# Patient Record
Sex: Male | Born: 1955 | Race: Black or African American | Hispanic: No | Smoking: Current every day smoker
Health system: Southern US, Community
[De-identification: ages and names within clinical notes are randomized; demographics above are authoritative.]

## PROBLEM LIST (undated history)

## (undated) DIAGNOSIS — I4901 Ventricular fibrillation: Secondary | ICD-10-CM

## (undated) DIAGNOSIS — Z720 Tobacco use: Secondary | ICD-10-CM

## (undated) DIAGNOSIS — I513 Intracardiac thrombosis, not elsewhere classified: Secondary | ICD-10-CM

## (undated) DIAGNOSIS — I472 Ventricular tachycardia: Secondary | ICD-10-CM

## (undated) DIAGNOSIS — E785 Hyperlipidemia, unspecified: Secondary | ICD-10-CM

## (undated) DIAGNOSIS — R569 Unspecified convulsions: Secondary | ICD-10-CM

## (undated) DIAGNOSIS — I2510 Atherosclerotic heart disease of native coronary artery without angina pectoris: Secondary | ICD-10-CM

## (undated) DIAGNOSIS — F1020 Alcohol dependence, uncomplicated: Secondary | ICD-10-CM

## (undated) DIAGNOSIS — I8290 Acute embolism and thrombosis of unspecified vein: Secondary | ICD-10-CM

## (undated) DIAGNOSIS — I255 Ischemic cardiomyopathy: Secondary | ICD-10-CM

## (undated) DIAGNOSIS — K219 Gastro-esophageal reflux disease without esophagitis: Secondary | ICD-10-CM

## (undated) DIAGNOSIS — I10 Essential (primary) hypertension: Secondary | ICD-10-CM

## (undated) DIAGNOSIS — I4729 Other ventricular tachycardia: Secondary | ICD-10-CM

## (undated) DIAGNOSIS — S301XXA Contusion of abdominal wall, initial encounter: Secondary | ICD-10-CM

## (undated) HISTORY — PX: TONSILLECTOMY: SUR1361

## (undated) HISTORY — DX: Hyperlipidemia, unspecified: E78.5

## (undated) HISTORY — PX: CARDIAC CATHETERIZATION: SHX172

## (undated) HISTORY — PX: OTHER SURGICAL HISTORY: SHX169

## (undated) HISTORY — PX: CORONARY ANGIOPLASTY: SHX604

---

## 2005-02-07 ENCOUNTER — Emergency Department: Payer: Self-pay | Admitting: Emergency Medicine

## 2005-02-15 ENCOUNTER — Emergency Department: Payer: Self-pay | Admitting: Emergency Medicine

## 2007-11-22 ENCOUNTER — Emergency Department: Payer: Self-pay | Admitting: Internal Medicine

## 2007-11-22 ENCOUNTER — Other Ambulatory Visit: Payer: Self-pay

## 2007-11-26 ENCOUNTER — Ambulatory Visit: Payer: Self-pay | Admitting: Internal Medicine

## 2012-08-07 ENCOUNTER — Inpatient Hospital Stay (HOSPITAL_COMMUNITY)
Admission: EM | Admit: 2012-08-07 | Discharge: 2012-08-22 | DRG: 246 | Disposition: A | Discharge status: Home / Self Care | Payer: Medicaid Other | Attending: Cardiology | Admitting: Cardiology

## 2012-08-07 ENCOUNTER — Encounter (HOSPITAL_COMMUNITY): Payer: Self-pay | Admitting: Physician Assistant

## 2012-08-07 ENCOUNTER — Inpatient Hospital Stay (HOSPITAL_COMMUNITY): Payer: Medicaid Other

## 2012-08-07 ENCOUNTER — Encounter (HOSPITAL_COMMUNITY)
Admission: EM | Disposition: A | Discharge status: Home / Self Care | Payer: Self-pay | Source: Home / Self Care | Attending: Cardiology

## 2012-08-07 DIAGNOSIS — Z720 Tobacco use: Secondary | ICD-10-CM | POA: Diagnosis present

## 2012-08-07 DIAGNOSIS — I959 Hypotension, unspecified: Secondary | ICD-10-CM | POA: Diagnosis not present

## 2012-08-07 DIAGNOSIS — Y840 Cardiac catheterization as the cause of abnormal reaction of the patient, or of later complication, without mention of misadventure at the time of the procedure: Secondary | ICD-10-CM | POA: Diagnosis not present

## 2012-08-07 DIAGNOSIS — E7800 Pure hypercholesterolemia, unspecified: Secondary | ICD-10-CM | POA: Diagnosis present

## 2012-08-07 DIAGNOSIS — F1020 Alcohol dependence, uncomplicated: Secondary | ICD-10-CM | POA: Diagnosis present

## 2012-08-07 DIAGNOSIS — I2510 Atherosclerotic heart disease of native coronary artery without angina pectoris: Secondary | ICD-10-CM | POA: Diagnosis present

## 2012-08-07 DIAGNOSIS — I8290 Acute embolism and thrombosis of unspecified vein: Secondary | ICD-10-CM

## 2012-08-07 DIAGNOSIS — I10 Essential (primary) hypertension: Secondary | ICD-10-CM | POA: Diagnosis present

## 2012-08-07 DIAGNOSIS — I255 Ischemic cardiomyopathy: Secondary | ICD-10-CM

## 2012-08-07 DIAGNOSIS — F17200 Nicotine dependence, unspecified, uncomplicated: Secondary | ICD-10-CM

## 2012-08-07 DIAGNOSIS — S301XXD Contusion of abdominal wall, subsequent encounter: Secondary | ICD-10-CM

## 2012-08-07 DIAGNOSIS — I2109 ST elevation (STEMI) myocardial infarction involving other coronary artery of anterior wall: Secondary | ICD-10-CM

## 2012-08-07 DIAGNOSIS — I2129 ST elevation (STEMI) myocardial infarction involving other sites: Secondary | ICD-10-CM

## 2012-08-07 DIAGNOSIS — I472 Ventricular tachycardia, unspecified: Principal | ICD-10-CM | POA: Diagnosis present

## 2012-08-07 DIAGNOSIS — IMO0002 Reserved for concepts with insufficient information to code with codable children: Secondary | ICD-10-CM | POA: Diagnosis not present

## 2012-08-07 DIAGNOSIS — I252 Old myocardial infarction: Secondary | ICD-10-CM

## 2012-08-07 DIAGNOSIS — S301XXA Contusion of abdominal wall, initial encounter: Secondary | ICD-10-CM

## 2012-08-07 DIAGNOSIS — R209 Unspecified disturbances of skin sensation: Secondary | ICD-10-CM | POA: Diagnosis not present

## 2012-08-07 DIAGNOSIS — I2589 Other forms of chronic ischemic heart disease: Secondary | ICD-10-CM | POA: Diagnosis present

## 2012-08-07 DIAGNOSIS — Z87891 Personal history of nicotine dependence: Secondary | ICD-10-CM

## 2012-08-07 DIAGNOSIS — Y831 Surgical operation with implant of artificial internal device as the cause of abnormal reaction of the patient, or of later complication, without mention of misadventure at the time of the procedure: Secondary | ICD-10-CM | POA: Diagnosis not present

## 2012-08-07 DIAGNOSIS — K219 Gastro-esophageal reflux disease without esophagitis: Secondary | ICD-10-CM | POA: Diagnosis present

## 2012-08-07 DIAGNOSIS — I4901 Ventricular fibrillation: Secondary | ICD-10-CM | POA: Diagnosis not present

## 2012-08-07 DIAGNOSIS — I743 Embolism and thrombosis of arteries of the lower extremities: Secondary | ICD-10-CM | POA: Diagnosis not present

## 2012-08-07 DIAGNOSIS — T82897A Other specified complication of cardiac prosthetic devices, implants and grafts, initial encounter: Secondary | ICD-10-CM | POA: Diagnosis not present

## 2012-08-07 DIAGNOSIS — I491 Atrial premature depolarization: Secondary | ICD-10-CM | POA: Diagnosis present

## 2012-08-07 DIAGNOSIS — I5189 Other ill-defined heart diseases: Secondary | ICD-10-CM | POA: Diagnosis present

## 2012-08-07 DIAGNOSIS — I519 Heart disease, unspecified: Secondary | ICD-10-CM | POA: Diagnosis not present

## 2012-08-07 DIAGNOSIS — I4729 Other ventricular tachycardia: Principal | ICD-10-CM | POA: Diagnosis present

## 2012-08-07 HISTORY — DX: Unspecified convulsions: R56.9

## 2012-08-07 HISTORY — DX: Ventricular tachycardia: I47.2

## 2012-08-07 HISTORY — DX: Contusion of abdominal wall, initial encounter: S30.1XXA

## 2012-08-07 HISTORY — DX: Intracardiac thrombosis, not elsewhere classified: I51.3

## 2012-08-07 HISTORY — DX: Essential (primary) hypertension: I10

## 2012-08-07 HISTORY — DX: Alcohol dependence, uncomplicated: F10.20

## 2012-08-07 HISTORY — DX: Ventricular fibrillation: I49.01

## 2012-08-07 HISTORY — PX: PERCUTANEOUS CORONARY STENT INTERVENTION (PCI-S): SHX5485

## 2012-08-07 HISTORY — DX: Acute embolism and thrombosis of unspecified vein: I82.90

## 2012-08-07 HISTORY — DX: Other ventricular tachycardia: I47.29

## 2012-08-07 HISTORY — DX: Tobacco use: Z72.0

## 2012-08-07 HISTORY — PX: LEFT HEART CATHETERIZATION WITH CORONARY ANGIOGRAM: SHX5451

## 2012-08-07 HISTORY — DX: Gastro-esophageal reflux disease without esophagitis: K21.9

## 2012-08-07 HISTORY — DX: Atherosclerotic heart disease of native coronary artery without angina pectoris: I25.10

## 2012-08-07 HISTORY — DX: Ischemic cardiomyopathy: I25.5

## 2012-08-07 LAB — COMPREHENSIVE METABOLIC PANEL
ALT: 21 U/L (ref 0–53)
Albumin: 4.3 g/dL (ref 3.5–5.2)
Alkaline Phosphatase: 54 U/L (ref 39–117)
BUN: 10 mg/dL (ref 6–23)
Chloride: 99 mEq/L (ref 96–112)
Potassium: 3.9 mEq/L (ref 3.5–5.1)
Sodium: 138 mEq/L (ref 135–145)
Total Bilirubin: 1 mg/dL (ref 0.3–1.2)
Total Protein: 7.6 g/dL (ref 6.0–8.3)

## 2012-08-07 LAB — CBC
MCHC: 35 g/dL (ref 30.0–36.0)
MCV: 86.4 fL (ref 78.0–100.0)
Platelets: 185 10*3/uL (ref 150–400)
RDW: 12.2 % (ref 11.5–15.5)
WBC: 8.5 10*3/uL (ref 4.0–10.5)

## 2012-08-07 LAB — LIPID PANEL
HDL: 78 mg/dL (ref >39)
LDL Cholesterol: 113 mg/dL — ABNORMAL HIGH (ref 0–99)
Total CHOL/HDL Ratio: 2.6 RATIO
Triglycerides: 76 mg/dL (ref <150)
VLDL: 15 mg/dL (ref 0–40)

## 2012-08-07 LAB — PROTIME-INR: Prothrombin Time: 62.9 seconds — ABNORMAL HIGH (ref 11.6–15.2)

## 2012-08-07 LAB — TROPONIN I: Troponin I: 1.42 ng/mL (ref <0.30)

## 2012-08-07 LAB — PRO B NATRIURETIC PEPTIDE: Pro B Natriuretic peptide (BNP): 203.8 pg/mL — ABNORMAL HIGH (ref 0–125)

## 2012-08-07 LAB — MRSA PCR SCREENING: MRSA by PCR: NEGATIVE

## 2012-08-07 SURGERY — LEFT HEART CATHETERIZATION WITH CORONARY ANGIOGRAM

## 2012-08-07 MED ORDER — ENOXAPARIN SODIUM 40 MG/0.4ML ~~LOC~~ SOLN
40.0000 mg | SUBCUTANEOUS | Status: DC
  Filled 2012-08-07: qty 0.4

## 2012-08-07 MED ORDER — BIVALIRUDIN 250 MG IV SOLR
INTRAVENOUS | Status: AC
  Filled 2012-08-07: qty 250

## 2012-08-07 MED ORDER — CARVEDILOL 3.125 MG PO TABS
3.1250 mg | ORAL_TABLET | Freq: Two times a day (BID) | ORAL | Status: DC
  Administered 2012-08-07: 3.125 mg via ORAL
  Filled 2012-08-07 (×4): qty 1

## 2012-08-07 MED ORDER — ACETAMINOPHEN 325 MG PO TABS: 650.0000 mg | ORAL_TABLET | ORAL | Status: DC | PRN

## 2012-08-07 MED ORDER — ALPRAZOLAM 0.25 MG PO TABS: 0.2500 mg | ORAL_TABLET | Freq: Two times a day (BID) | ORAL | Status: DC | PRN

## 2012-08-07 MED ORDER — SODIUM CHLORIDE 0.9 % IV SOLN
250.0000 mL | INTRAVENOUS | Status: DC | PRN
  Administered 2012-08-13 – 2012-08-14 (×2): 250 mL via INTRAVENOUS

## 2012-08-07 MED ORDER — METOPROLOL TARTRATE 1 MG/ML IV SOLN
2.5000 mg | INTRAVENOUS | Status: AC
  Administered 2012-08-08: 2.5 mg via INTRAVENOUS
  Filled 2012-08-07: qty 5

## 2012-08-07 MED ORDER — NITROGLYCERIN 0.4 MG SL SUBL
0.4000 mg | SUBLINGUAL_TABLET | SUBLINGUAL | Status: DC | PRN
  Administered 2012-08-15: 0.4 mg via SUBLINGUAL

## 2012-08-07 MED ORDER — SODIUM CHLORIDE 0.9 % IJ SOLN: 3.0000 mL | INTRAMUSCULAR | Status: DC | PRN

## 2012-08-07 MED ORDER — ASPIRIN EC 81 MG PO TBEC
81.0000 mg | DELAYED_RELEASE_TABLET | Freq: Every day | ORAL | Status: DC
  Administered 2012-08-08 – 2012-08-09 (×2): 81 mg via ORAL
  Filled 2012-08-07 (×2): qty 1

## 2012-08-07 MED ORDER — EPTIFIBATIDE 75 MG/100ML IV SOLN
2.0000 ug/kg/min | INTRAVENOUS | Status: AC
  Administered 2012-08-08: 2 ug/kg/min via INTRAVENOUS
  Filled 2012-08-07 (×3): qty 100

## 2012-08-07 MED ORDER — VERAPAMIL HCL 2.5 MG/ML IV SOLN
INTRAVENOUS | Status: AC
  Filled 2012-08-07: qty 2

## 2012-08-07 MED ORDER — ASPIRIN 81 MG PO CHEW: 81.0000 mg | CHEWABLE_TABLET | Freq: Every day | ORAL | Status: DC

## 2012-08-07 MED ORDER — FENTANYL CITRATE 0.05 MG/ML IJ SOLN
INTRAMUSCULAR | Status: AC
  Filled 2012-08-07: qty 2

## 2012-08-07 MED ORDER — ZOLPIDEM TARTRATE 5 MG PO TABS: 5.0000 mg | ORAL_TABLET | Freq: Every evening | ORAL | Status: DC | PRN

## 2012-08-07 MED ORDER — SODIUM CHLORIDE 0.9 % IV SOLN
0.2500 mg/kg/h | INTRAVENOUS | Status: AC
  Filled 2012-08-07: qty 250

## 2012-08-07 MED ORDER — ATORVASTATIN CALCIUM 80 MG PO TABS
80.0000 mg | ORAL_TABLET | Freq: Every day | ORAL | Status: DC
  Administered 2012-08-08 – 2012-08-21 (×14): 80 mg via ORAL
  Filled 2012-08-07 (×16): qty 1

## 2012-08-07 MED ORDER — SODIUM CHLORIDE 0.9 % IV SOLN: 1.0000 mL/kg/h | INTRAVENOUS | Status: AC

## 2012-08-07 MED ORDER — LISINOPRIL 2.5 MG PO TABS
2.5000 mg | ORAL_TABLET | Freq: Every day | ORAL | Status: DC
  Administered 2012-08-08 – 2012-08-09 (×2): 2.5 mg via ORAL
  Filled 2012-08-07 (×3): qty 1

## 2012-08-07 MED ORDER — SODIUM CHLORIDE 0.9 % IJ SOLN
3.0000 mL | Freq: Two times a day (BID) | INTRAMUSCULAR | Status: DC
  Administered 2012-08-08 – 2012-08-19 (×16): 3 mL via INTRAVENOUS

## 2012-08-07 MED ORDER — EPTIFIBATIDE 75 MG/100ML IV SOLN
INTRAVENOUS | Status: AC
  Administered 2012-08-08: 1.993 ug/kg/min via INTRAVENOUS
  Filled 2012-08-07: qty 100

## 2012-08-07 MED ORDER — LIDOCAINE HCL (PF) 1 % IJ SOLN
INTRAMUSCULAR | Status: AC
  Filled 2012-08-07: qty 30

## 2012-08-07 MED ORDER — HEPARIN (PORCINE) IN NACL 2-0.9 UNIT/ML-% IJ SOLN
INTRAMUSCULAR | Status: AC
  Filled 2012-08-07: qty 1000

## 2012-08-07 MED ORDER — TICAGRELOR 90 MG PO TABS
90.0000 mg | ORAL_TABLET | Freq: Two times a day (BID) | ORAL | Status: DC
  Administered 2012-08-07 – 2012-08-11 (×9): 90 mg via ORAL
  Filled 2012-08-07 (×11): qty 1

## 2012-08-07 MED ORDER — TICAGRELOR 90 MG PO TABS
ORAL_TABLET | ORAL | Status: AC
  Filled 2012-08-07: qty 2

## 2012-08-07 MED ORDER — HEPARIN SODIUM (PORCINE) 1000 UNIT/ML IJ SOLN
INTRAMUSCULAR | Status: AC
  Filled 2012-08-07: qty 1

## 2012-08-07 MED ORDER — ONDANSETRON HCL 4 MG/2ML IJ SOLN: 4.0000 mg | Freq: Four times a day (QID) | INTRAMUSCULAR | Status: DC | PRN

## 2012-08-07 NOTE — CV Procedure (Signed)
Cardiac Catheterization Procedure Note  Name: Edward Cooper MRN: [id number] DOB: 08-20-1955  Procedure: Left Heart Cath, Selective Coronary Angiography, LV angiography,  PTCA/Stent of the ostial LAD, thrombus extraction.  Indication: 57 yo BM with history of tobacco abuse presents with an anterior STEMI with marked ST elevation (5-6 mm). He also developed sustained ventricular tachycardia that converted spontaneously.   Diagnostic Procedure Details: We initially prepped and draped the right wrist. Despite a good radial pulse we were unable to access the artery and switched to a femoral access.The right groin was prepped, draped, and anesthetized with 1% lidocaine. Using the modified Seldinger technique, a 6 French sheath was introduced into the right femoral artery. Standard Judkins catheters were used for selective coronary angiography and left ventriculography. Catheter exchanges were performed over a wire.  The diagnostic procedure was well-tolerated without immediate complications.  PROCEDURAL FINDINGS Hemodynamics: AO 130/73 mm Hg LV 132/10 mm Hg  Coronary angiography: Coronary dominance: right  Left mainstem: The left main is very short and bifurcates immediately into the LAD and LCx.  Left anterior descending (LAD): There is a ruptured plaque at the ostium of the LAD. There is a 95% ostial stenosis with haziness and thrombus. The mid LAD is occluded with TIMI 0 flow. There is a moderate sized first diagonal that appears normal.  Left circumflex (LCx): Normal.  Right coronary artery (RCA): dominant vessel with diffuse 50% disease throughout the proximal to mid vessel.   Left ventriculography: Performed at the end of procedure. Left ventricular systolic function is abnormal, LVEF is estimated at 40-45%, There is severe anterior hypokinesia with focal apical dyskinesia. There is no significant mitral regurgitation   PCI Procedure Note:  Following the diagnostic procedure, the  decision was made to proceed with PCI.  Weight-based bivalirudin was given for anticoagulation. Brilinta 180 mg was given orally. Once a therapeutic ACT was achieved, a 6 [place] XBLAD 4 guide catheter was inserted.  A prowater coronary guidewire was used to cross the lesion.  The occlusion in the mid LAD  was predilated with a 2.5 mm balloon. There was still occlusion of the vessel at this point. Thrombus extraction was performed in the mid LAD with some restoration of flow into the distal vessel past the second diagonal. A second prowater wire was then placed down the first OM to protect the LCx. We then addressed the lesion at the ostium of the LAD. The lesion was  stented with a 3.0 x 12 mm Promus premier stent.  Despite careful positioning under fluoro the stent did not adequately cover the ostium and a second 3.0 x 8 mm Promus premier stent was used to cover the gap. The stent was postdilated with a 3.25 mm noncompliant balloon.  We next performed another pass with the thrombus extraction catheter further distally. This restored good flow in the LAD in all but the apical LAD which remained occluded. With both passes of the aspiration catheter we obtained copious amounts of thrombus. We added IV integrelin to his medical regimen due to the large thrombus burden.Following PCI, there was 0% residual stenosis at the ostium and TIMI-3 flow into all but the very distal LAD. Final angiography confirmed an excellent result. Femoral hemostasis was achieved with an Angioseal closure device.  The patient tolerated the PCI procedure well. He was pain free at the end of procedure. There were no immediate procedural complications.  The patient was transferred to the post catheterization recovery area for further monitoring.  PCI Data: Vessel - LAD/Segment -  ostial with thrombus propagation to the mid LAD Percent Stenosis (pre)  100% TIMI-flow 3 Stent 3.0 x 12 and 3.0 x 8 Promus stents for the LAD ostium. PTCA and  thrombus extraction for the mid LAD Percent Stenosis (post) 0% TIMI-flow (post) 3 except for very distal LAD  Final Conclusions:   1. Single vessel obstructive CAD with rupture plaque in the LAD ostium and thrombotic occlusion of the mid LAD 2. Mild to moderate LV dysfunction. 3. Successful stenting of the LAD ostium with thrombus extraction of the mid LAD  Recommendations: Dual antiplatelet therapy for one year. Continue IV integrelin for 18 hours and reduced dose angiomax for 2 hours. Add statin, ACEi, and beta blocker. Check Echo tomorrow. Serial Ecgs and cardiac enzymes.  Theron Arista [facility] 08/07/2012, 7:36 PM

## 2012-08-07 NOTE — H&P (Signed)
History and Physical   Patient ID: Cleto Claggett MRN: [id number], DOB/AGE: Apr 17, 1955 57 y.o. Date of Encounter: 08/07/2012  Primary Physician: None. Primary Cardiologist: None  Chief Complaint:  STEMI  HPI: Syncere Eble is a 57 y.o. male with no history of CAD. He had onset of substernal chest pain last p.m. that he thought was indigestion. He was able to go to sleep. Today, when he woke up, he was having chest pain. It was severe. When his condition did not improve, he finally called EMS. Upon EMS arrival, he was in significant distress. His ECG was consistent with an anterior/septal STEMI. He was given aspirin 81 mg x4, sublingual nitroglycerin x2, and fentanyl 75 mcg. His systolic blood pressure was initially 180. After the 2nd nitroglycerin, his systolic blood pressure dropped down close to 100 and they did not give any more nitroglycerin. After the nitroglycerin, he had some ventricular tachycardia. This was captured on telemetry. If stopped prior to administration of amiodarone so this was not given. He was having ongoing pain and his ECG was significantly abnormal upon arrival to the emergency room so he was taken directly to the cath lab. He has never had pain like this before. He has not seen a physician in more than 5 years.   Past Medical History  Diagnosis Date  . Tobacco use     Surgical History:  Past Surgical History  Procedure Laterality Date  . None       I have reviewed the patient's current medications. Prior to Admission medications   None    Allergies: No known drug allergies  History   Social History  . Marital Status: N/A    Spouse Name: N/A    Number of Children: N/A  . Years of Education: N/A   Occupational History  . Unemployed    Social History Main Topics  . Smoking status: Current Every Day Smoker -- 0.50 packs/day for 40 years  . Smokeless tobacco: Not on file  . Alcohol Use: No  . Drug Use: No  . Sexually Active: Not on file   Other  Topics Concern  . Not on file   Social History Narrative   Patient lives alone in a trailer. There is no family history of premature coronary artery disease in either parent or any other siblings.    Review of Systems:   Full 14-point review of systems otherwise negative except as noted above.  Physical Exam: Height 6' (1.829 m), weight 152 lb (68.947 kg). General: Well developed, slender,male in acute distress. Head: Normocephalic, atraumatic, sclera non-icteric, no xanthomas, nares are without discharge. Dentition: Poor Neck: No carotid bruits. JVD not elevated. No thyromegally Lungs: Good expansion bilaterally. without wheezes or rhonchi.  Heart: Regular rate and rhythm with S1 S2.  No S3 or S4.  No murmur, no rubs, or gallops appreciated. Abdomen: Soft, non-tender, non-distended with normoactive bowel sounds. No hepatomegaly. No rebound/guarding. No obvious abdominal masses. Msk:  Strength and tone appear normal for age. No joint deformities or effusions, no spine or costo-vertebral angle tenderness. Extremities: No clubbing or cyanosis. No edema.  Distal pedal pulses are 2+ in 4 extrem Neuro: Alert and oriented X 3. Moves all extremities spontaneously. No focal deficits noted. Psych:  Responds to questions appropriately with a normal affect. Skin: No rashes or lesions noted  Labs: Pending   Radiology/Studies: Pending   ECG: Sinus rhythm, rate 88 beats per minute with anteroseptal ST elevation greater than 3 mm  ASSESSMENT AND PLAN:  Mr. Lise Auer is a 57 year old male with no previous cardiac issues. He was having chest pain and an ECG performed by EMS showed a STEMI. He was treated and brought emergently to the Cath Lab. Further evaluation and treatment will depend on the results. We will admit him to CCU and to screen for cardiac risk factors. He will be counseled on risk factor reduction including smoking cessation.  Principal Problem:   ST elevation myocardial infarction (STEMI)  of anterior wall, initial episode of care Active Problems:   Hypertension   Melida Quitter, PA-C 08/07/2012 6:43 PM Beeper [phone]  Patient seen and examined and history reviewed. Agree with above findings and plan. Patient has severe chest pain and had sustained VT that converted spontaneously. Ecg shows marked injury pattern in the anterior leads. Will proceed with emergent cardiac cath/PCI.  Theron Arista [facility] 08/07/2012 8:06 PM

## 2012-08-07 NOTE — Progress Notes (Signed)
Angiomax and integrelin Post Cath  MD has requested the Angiomax continue at a slow rate for 2 hrs and the integrelin continue for 18 hrs following successful stenting of the LAD ostium with thrombus extraction of the mid LAD.  CBC is ordered with morning labs.  Cardell Peach, PharmD

## 2012-08-08 DIAGNOSIS — I472 Ventricular tachycardia, unspecified: Secondary | ICD-10-CM | POA: Diagnosis present

## 2012-08-08 DIAGNOSIS — I517 Cardiomegaly: Secondary | ICD-10-CM

## 2012-08-08 LAB — CBC
HCT: 41.3 % (ref 39.0–52.0)
MCHC: 35.4 g/dL (ref 30.0–36.0)
RDW: 12.4 % (ref 11.5–15.5)

## 2012-08-08 LAB — COMPREHENSIVE METABOLIC PANEL
BUN: 7 mg/dL (ref 6–23)
CO2: 27 mEq/L (ref 19–32)
Chloride: 97 mEq/L (ref 96–112)
Creatinine, Ser: 0.77 mg/dL (ref 0.50–1.35)
GFR calc Af Amer: >90 mL/min (ref >90)
GFR calc non Af Amer: >90 mL/min (ref >90)
Glucose, Bld: 128 mg/dL — ABNORMAL HIGH (ref 70–99)
Total Bilirubin: 0.8 mg/dL (ref 0.3–1.2)

## 2012-08-08 LAB — PLATELET COUNT: Platelets: 164 K/uL (ref 150–400)

## 2012-08-08 LAB — LIPID PANEL
Cholesterol: 211 mg/dL — ABNORMAL HIGH (ref 0–200)
HDL: 80 mg/dL
LDL Cholesterol: 114 mg/dL — ABNORMAL HIGH (ref 0–99)
Total CHOL/HDL Ratio: 2.6 ratio
Triglycerides: 87 mg/dL
VLDL: 17 mg/dL (ref 0–40)

## 2012-08-08 LAB — PROTIME-INR: INR: 1.06 (ref 0.00–1.49)

## 2012-08-08 LAB — POCT I-STAT, CHEM 8
BUN: 8 mg/dL (ref 6–23)
Calcium, Ion: 1 mmol/L — ABNORMAL LOW (ref 1.12–1.23)
Chloride: 96 mEq/L (ref 96–112)
Creatinine, Ser: 0.7 mg/dL (ref 0.50–1.35)
Glucose, Bld: 137 mg/dL — ABNORMAL HIGH (ref 70–99)
TCO2: 18 mmol/L (ref 0–100)

## 2012-08-08 LAB — MAGNESIUM: Magnesium: 2.3 mg/dL (ref 1.5–2.5)

## 2012-08-08 LAB — TSH: TSH: 2.18 u[IU]/mL (ref 0.350–4.500)

## 2012-08-08 LAB — TROPONIN I: Troponin I: >20 ng/mL

## 2012-08-08 LAB — POCT ACTIVATED CLOTTING TIME: Activated Clotting Time: 334 seconds

## 2012-08-08 MED ORDER — PATIENT'S GUIDE TO USING COUMADIN BOOK
Freq: Once | Status: AC
  Administered 2012-08-08: 15:00:00
  Filled 2012-08-08: qty 1

## 2012-08-08 MED ORDER — ENOXAPARIN SODIUM 80 MG/0.8ML ~~LOC~~ SOLN
65.0000 mg | Freq: Two times a day (BID) | SUBCUTANEOUS | Status: DC
  Administered 2012-08-08 – 2012-08-09 (×2): 65 mg via SUBCUTANEOUS
  Filled 2012-08-08 (×4): qty 0.8

## 2012-08-08 MED ORDER — ENSURE COMPLETE PO LIQD
237.0000 mL | Freq: Two times a day (BID) | ORAL | Status: DC
  Administered 2012-08-09 – 2012-08-14 (×5): 237 mL via ORAL

## 2012-08-08 MED ORDER — METOPROLOL TARTRATE 1 MG/ML IV SOLN
2.5000 mg | INTRAVENOUS | Status: AC
  Administered 2012-08-08: 2.5 mg via INTRAVENOUS

## 2012-08-08 MED ORDER — PERFLUTREN LIPID MICROSPHERE
1.0000 mL | INTRAVENOUS | Status: AC | PRN
  Filled 2012-08-08: qty 10

## 2012-08-08 MED ORDER — CARVEDILOL 6.25 MG PO TABS
6.2500 mg | ORAL_TABLET | Freq: Two times a day (BID) | ORAL | Status: DC
  Administered 2012-08-08 – 2012-08-14 (×14): 6.25 mg via ORAL
  Filled 2012-08-08 (×18): qty 1

## 2012-08-08 MED ORDER — WARFARIN VIDEO
Freq: Once | Status: AC
  Administered 2012-08-08: 15:00:00

## 2012-08-08 MED ORDER — PERFLUTREN LIPID MICROSPHERE
INTRAVENOUS | Status: AC
  Filled 2012-08-08: qty 10

## 2012-08-08 MED ORDER — WARFARIN - PHARMACIST DOSING INPATIENT: Freq: Every day | Status: DC

## 2012-08-08 MED ORDER — PANTOPRAZOLE SODIUM 40 MG PO TBEC
40.0000 mg | DELAYED_RELEASE_TABLET | Freq: Every day | ORAL | Status: DC
  Administered 2012-08-08 – 2012-08-22 (×15): 40 mg via ORAL
  Filled 2012-08-08 (×14): qty 1

## 2012-08-08 MED ORDER — WARFARIN SODIUM 5 MG PO TABS
5.0000 mg | ORAL_TABLET | Freq: Once | ORAL | Status: AC
  Administered 2012-08-08: 5 mg via ORAL
  Filled 2012-08-08: qty 1

## 2012-08-08 MED FILL — Sodium Chloride IV Soln 0.9%: INTRAVENOUS | Qty: 50 | Status: AC

## 2012-08-08 NOTE — Progress Notes (Signed)
ANTICOAGULATION CONSULT NOTE - Initial Up Consult  Pharmacy Consult for Lovenox and Coumadin Indication: LV thrombus  No Known Allergies  Patient Measurements: Height: 6\' 1"  (185.4 cm) Weight: 147 lb 14.9 oz (67.1 kg) IBW/kg (Calculated) : 79.9  Labs:  Recent Labs  08/07/12 1845 08/07/12 2052 08/08/12 0014 08/08/12 0246 08/08/12 0810  HGB 13.5  --  14.6  --   --   HCT 38.6*  --  41.3  --   --   PLT 185  --  196  --   --   APTT 170*  --   --   --   --   LABPROT 62.9*  --  13.7  --   --   INR 8.31*  --  1.06  --   --   CREATININE 0.79  --  0.77  --   --   CKTOTAL 202  --   --   --   --   CKMB 10.9*  --   --   --   --   TROPONINI 1.42* 13.24*  --  >20.00* >20.00*    Estimated Creatinine Clearance: 99 ml/min (by C-G formula based on Cr of 0.77).  Marland Kitchen eptifibatide 1.993 mcg/kg/min (08/08/12 6962)     Assessment: 57 yo male s/p code STEMI with successful PCI.  On Integrilin @ 2 mcg/kg/hr x 18 hrs post procedure.  Scheduled to end today at 1500.  No bleeding or complications noted.  CBC (and platelet count) stable.  ECHO revealed apical clot, and pharmacy asked to begin full-dose Lovenox and Coumadin today.  Baseline INR WNL.  Goal of Therapy:  Anti Xa level 0.6-1.2, 4 hrs after lovenox dose Monitor platelets by anticoagulation protocol: Yes   Plan:  1.  Lovenox 1 mg/kg q 12 hrs (65 mg). 2. Coumadin 5 mg po x 1 tonight. 3. Daily PT/INR. 4. CBC q 72 hrs while on Lovenox. 5. Will begin Coumadin education.  Tad Moore, BCPS  Clinical Pharmacist Pager [phone]  08/08/2012 2:39 PM

## 2012-08-08 NOTE — Care Management Note (Addendum)
    Page 1 of 2   08/21/2012     1:38:06 PM   CARE MANAGEMENT NOTE 08/21/2012  Patient:  Edward Cooper,Edward Cooper   Account Number:  [id number]  Date Initiated:  08/08/2012  Documentation initiated by:  Junius Creamer  Subjective/Objective Assessment:   adm w mi     Action/Plan:   lives w fam   Anticipated DC Date:  08/20/2012   Anticipated DC Plan:  HOME/SELF CARE      DC Planning Services  CM consult  Medication Assistance      Choice offered to / List presented to:             Status of service:  In process, will continue to follow Medicare Important Message given?   (If response is "NO", the following Medicare IM given date fields will be blank) Date Medicare IM given:   Date Additional Medicare IM given:    Discharge Disposition:    Per UR Regulation:  Reviewed for med. necessity/level of care/duration of stay  If discussed at Long Length of Stay Meetings, dates discussed:   08/14/2012  08/21/2012    Comments:   08-21-12 [address], [place] [phone] CM did speak to pt in reference to transportation and f/u visits for PT/INR checks. Pt does not have a PCP and CM did provide pt with information to the [facility] in [place]. Pt states he will f/u at the Cardiologists office for labs. Unsure of cost at this time per visit. If the cost will be to expensive he will need to go to the clinic for a sliding scale fee. Pt stated he will  be using Medicaid Transportation at the cost of 5.00 and he will call and set up appointments for pick up. CM did make pt aware that [employer] is not guaranteed to be approved. Cm will continue to monitor for disposition needs.  08-20-12  LV Thrombus: continue heparin. Will bridge back to coumadin. Home when INR therapeutic. INR 1.49 today. Tomi [place], [place] [phone]   08-15-12 1:20pm Avie Arenas, RNBSN [phone] STEMI - emergent cath lab - Vfib - defibed.  tx to ICU post cath.  08-10-12 1205 Tomi B[place]ger, RN,BSN [phone] CM did speak to pt and the [facility] was notified on 08-08-12. FC is looking at the case to see if pt is eligible for medicaid, however this may not be an easy task. CM did provide pt with list for PCP f/u in [place] and list that obtained the [facility] DSS #. The [facility] is in [place]. and pt will need to f/u with this resource. Pt states he rides a bike and does not drive. CM did provide pt with the application for eligibility for the [facility]. [employer] pt assistance form is on the chart for brilinta. MD please fill out and pt will need a 30 day free Rx no refills. Pt will have to fax remaining forms to [employer] with tax records. No further needs from CM at this time.   6/11 1237p debbie dowell rn,bsn spoke w pt. no ins or pcp at present. pt lives in [place] gave pt 30day free and copay assist card for brilinta. pt assist form for brilinta in shadow chart for md to sign. gave pt pharm discount card that may help w brand names. gave pt [place] [employer] w inform on clinics to establish pcp.

## 2012-08-08 NOTE — Progress Notes (Signed)
*  PRELIMINARY RESULTS* Echocardiogram 2D Echocardiogram has been performed.  Edward Cooper 08/08/2012, 12:02 PM

## 2012-08-08 NOTE — Progress Notes (Signed)
TELEMETRY: Reviewed telemetry pt in NSR with repetitive runs of AIVR and Vtach up to 19 beats: Filed Vitals:   08/08/12 0200 08/08/12 0300 08/08/12 0400 08/08/12 0500  BP: 150/96 152/100 154/97 145/94  Pulse: 79 79 83 70  Temp:   99 F (37.2 C)   TempSrc:   Oral   Resp: 18 14 18 16   Height:      Weight:      SpO2: 95% 99% 95% 99%    Intake/Output Summary (Last 24 hours) at 08/08/12 0646 Last data filed at 08/08/12 0500  Gross per 24 hour  Intake 986.35 ml  Output    350 ml  Net 636.35 ml    SUBJECTIVE Patient has some mild chest pressure. Complains of acid. No SOB.  LABS: Basic Metabolic Panel:  Recent Labs  40/98/11 1845 08/08/12 0014  NA 138 136  K 3.9 4.6  CL 99 97  CO2 18* 27  GLUCOSE 161* 128*  BUN 10 7  CREATININE 0.79 0.77  CALCIUM 9.1 9.8  MG  --  2.3   Liver Function Tests:  Recent Labs  08/07/12 1845 08/08/12 0014  AST 35 155*  ALT 21 35  ALKPHOS 54 56  BILITOT 1.0 0.8  PROT 7.6 7.6  ALBUMIN 4.3 4.3    CBC:  Recent Labs  08/07/12 1845 08/08/12 0014  WBC 8.5 10.2  HGB 13.5 14.6  HCT 38.6* 41.3  MCV 86.4 86.9  PLT 185 196   Cardiac Enzymes:  Recent Labs  08/07/12 1845 08/07/12 2052 08/08/12 0246  CKTOTAL 202  --   --   CKMB 10.9*  --   --   TROPONINI 1.42* 13.24* >20.00*   Hemoglobin A1C:  Recent Labs  08/07/12 1845  HGBA1C 5.1   Fasting Lipid Panel:  Recent Labs  08/08/12 0014  CHOL 211*  HDL 80  LDLCALC 114*  TRIG 87  CHOLHDL 2.6   Radiology/Studies:  Portable Chest X-ray 1 View  08/07/2012   *RADIOLOGY REPORT*  Clinical Data: Myocardial infarction.  PORTABLE CHEST - 1 VIEW  Comparison: None.  Findings: The heart size is normal.  No edema or pulmonary consolidation is identified.  No pleural fluid is seen.  IMPRESSION: No active disease.   Original Report Authenticated By: Irish Lack, M.D.   Ecg: NSR, persistent ST elevation V1-V6. Q waves Avl, V1-2.  PHYSICAL EXAM General: Well developed, thin,  in no acute distress. Head: Normocephalic, atraumatic, sclera non-icteric, no xanthomas, nares are without discharge. Dental caries. Neck: Negative for carotid bruits. JVD not elevated. Lungs: Clear bilaterally to auscultation without wheezes, rales, or rhonchi. Breathing is unlabored. Heart: RRR S1 S2 without murmurs, rubs, or gallops.  Abdomen: Soft, non-tender, non-distended with normoactive bowel sounds. No hepatomegaly. No rebound/guarding. No obvious abdominal masses. Msk:  Strength and tone appears normal for age. Extremities: No clubbing, cyanosis or edema.  Distal pedal pulses are 2+ and equal bilaterally. Right groin without hematoma.  Neuro: Alert and oriented X 3. Moves all extremities spontaneously. Psych:  Responds to questions appropriately with a normal affect.  ASSESSMENT AND PLAN: 1. Anterior STEMI s/p stenting of the ostial LAD with thrombus extraction of mid to distal LAD. Some residual ST elevation post reperfusion with large infarct and persistent occlusion of distal LAD. Continue dual antiplatelet therapy. Complete 18 hrs of IV integrelin. Increase beta blocker and add ACEi. Check Echo today. Continue ICU care. 2. Kirby Funk. Secondary to MI and reperfusion. Increase beta blocker. Keep K over 4.0. Check magnesium  level. 3. HTN- will increase Coreg. Start lisinopril today. 4. Tobacco abuse- discussed smoking cessation. 5. Hypercholesterolemia. On high dose statin.    Principal Problem:   ST elevation myocardial infarction (STEMI) of anterior wall, initial episode of care Active Problems:   Hypertension    Tobacco abuse    Signed, Peter [place] [facility] 08/08/2012 6:54 AM]

## 2012-08-08 NOTE — Progress Notes (Signed)
INITIAL NUTRITION ASSESSMENT  DOCUMENTATION CODES Per approved criteria  -Not Applicable   INTERVENTION: 1. Ensure Complete po BID, each supplement provides 350 kcal and 13 grams of protein.   NUTRITION DIAGNOSIS: Inadequate oral intake related to decreased appetite, N/V as evidenced by N/V PTA.   Goal: PO intake to meet >/=90% estimated nutrition needs  Monitor:  PO intake, weight trends, labs, I/O's   Reason for Assessment: Malnutrition Screening Tool  57 y.o. male  Admitting Dx: ST elevation myocardial infarction (STEMI) of anterior wall, initial episode of care  ASSESSMENT: Pt admitted with severe chest pain, found with STEMI. S/p cardiac cath, Single vessel obstructive CAD with rupture plaque in the LAD ostium and thrombotic occlusion of the mid LAD and Successful stenting of the LAD ostium with thrombus extraction of the mid LAD.  Pt reports that prior to admission his appetite was poor and was vomiting from what he thought was acid reflux. Appetite is improved, no vomiting today. Reports weight loss, unknown amount. Was not able to provide usual weight. No weight hx on file.    Height: Ht Readings from Last 1 Encounters:  08/07/12 6\' 1"  (1.854 m)    Weight: Wt Readings from Last 1 Encounters:  08/07/12 147 lb 14.9 oz (67.1 kg)    Ideal Body Weight: 184 lbs   % Ideal Body Weight: 80%  Wt Readings from Last 10 Encounters:  08/07/12 147 lb 14.9 oz (67.1 kg)  08/07/12 147 lb 14.9 oz (67.1 kg)    Usual Body Weight: pt unsure  % Usual Body Weight: --  BMI:  Body mass index is 19.52 kg/(m^2). WNL   Estimated Nutritional Needs: Kcal: 1850-2050 Protein: 65-75 gm  Fluid: 1.9-2.1 L   Skin: intact   Diet Order: Cardiac  EDUCATION NEEDS: -No education needs identified at this time   Intake/Output Summary (Last 24 hours) at 08/08/12 1148 Last data filed at 08/08/12 1100  Gross per 24 hour  Intake 1938.95 ml  Output   1200 ml  Net 738.95 ml    Last  BM: PTA    Labs:   Recent Labs Lab 08/07/12 1845 08/08/12 0014  NA 138 136  K 3.9 4.6  CL 99 97  CO2 18* 27  BUN 10 7  CREATININE 0.79 0.77  CALCIUM 9.1 9.8  MG  --  2.3  GLUCOSE 161* 128*    CBG (last 3)  No results found for this basename: GLUCAP,  in the last 72 hours Lab Results  Component Value Date   HGBA1C 5.1 08/07/2012     Scheduled Meds: . aspirin EC  81 mg Oral Daily  . atorvastatin  80 mg Oral q1800  . carvedilol  6.25 mg Oral BID WC  . enoxaparin (LOVENOX) injection  40 mg Subcutaneous Q24H  . lisinopril  2.5 mg Oral Daily  . pantoprazole  40 mg Oral Daily  . perflutren lipid microspheres (DEFINITY) IV suspension      . sodium chloride  3 mL Intravenous Q12H  . Ticagrelor  90 mg Oral BID    Continuous Infusions: . eptifibatide 1.993 mcg/kg/min (08/08/12 1610)    Past Medical History  Diagnosis Date  . Tobacco use   . Myocardial infarction   . Coronary artery disease   . GERD (gastroesophageal reflux disease)   . Seizures   . Alcoholism     Past Surgical History  Procedure Laterality Date  . None    . Tonsillectomy      Clarene Duke RD,  LDN Pager [phone] After Hours pager [phone]

## 2012-08-08 NOTE — Progress Notes (Signed)
ANTICOAGULATION CONSULT NOTE - Follow Up Consult  Pharmacy Consult for Integrilin Indication: s/p PCI x 18 hrs  No Known Allergies  Patient Measurements: Height: 6\' 1"  (185.4 cm) Weight: 147 lb 14.9 oz (67.1 kg) IBW/kg (Calculated) : 79.9  Labs:  Recent Labs  08/07/12 1845 08/07/12 2052 08/08/12 0014 08/08/12 0246 08/08/12 0810  HGB 13.5  --  14.6  --   --   HCT 38.6*  --  41.3  --   --   PLT 185  --  196  --   --   APTT 170*  --   --   --   --   LABPROT 62.9*  --  13.7  --   --   INR 8.31*  --  1.06  --   --   CREATININE 0.79  --  0.77  --   --   CKTOTAL 202  --   --   --   --   CKMB 10.9*  --   --   --   --   TROPONINI 1.42* 13.24*  --  >20.00* >20.00*    Estimated Creatinine Clearance: 99 ml/min (by C-G formula based on Cr of 0.77).  Marland Kitchen eptifibatide 1.993 mcg/kg/min (08/08/12 9562)     Assessment: 57 yo male s/p code STEMI with successful PCI.  On Integrilin @ 2 mcg/kg/hr x 18 hrs post procedure.  Scheduled to end today at 1500.  No bleeding or complications noted.  CBC (and platelet count) stable.  Initial INR likely falsely elevated due to effect of Angiomax.  This AM, INR WNL.  Goal of Therapy:   Monitor platelets by anticoagulation protocol: Yes   Plan:  1. Continue Integrilin at current rate until scheduled end time. 2. Pharmacy will sign-off.  Thanks!  Tad Moore, BCPS  Clinical Pharmacist Pager [phone]  08/08/2012 10:12 AM

## 2012-08-08 NOTE — Progress Notes (Signed)
Holding ambulation today. Began ed with MI/stent/restrictions/meds. Pt listened however seemed somewhat gruff. Will need to reiterate ed. Gave smoking cessation resource. Pt sts he is tired of talking about it right now. Will f/u tomorrow. [id number] Ethelda Chick CES, ACSM 3:04 PM 08/08/2012

## 2012-08-09 DIAGNOSIS — I2129 ST elevation (STEMI) myocardial infarction involving other sites: Secondary | ICD-10-CM | POA: Diagnosis not present

## 2012-08-09 LAB — BASIC METABOLIC PANEL
Calcium: 9.7 mg/dL (ref 8.4–10.5)
Creatinine, Ser: 0.9 mg/dL (ref 0.50–1.35)
GFR calc non Af Amer: >90 mL/min (ref >90)
Glucose, Bld: 105 mg/dL — ABNORMAL HIGH (ref 70–99)
Sodium: 134 mEq/L — ABNORMAL LOW (ref 135–145)

## 2012-08-09 LAB — PROTIME-INR: INR: 1.04 (ref 0.00–1.49)

## 2012-08-09 MED ORDER — ENOXAPARIN SODIUM 80 MG/0.8ML ~~LOC~~ SOLN
70.0000 mg | Freq: Two times a day (BID) | SUBCUTANEOUS | Status: DC
  Administered 2012-08-09 – 2012-08-11 (×5): 70 mg via SUBCUTANEOUS
  Filled 2012-08-09 (×8): qty 0.8

## 2012-08-09 MED ORDER — WARFARIN SODIUM 5 MG PO TABS
5.0000 mg | ORAL_TABLET | Freq: Once | ORAL | Status: AC
  Administered 2012-08-09: 5 mg via ORAL
  Filled 2012-08-09 (×2): qty 1

## 2012-08-09 NOTE — Progress Notes (Signed)
CARDIAC REHAB PHASE I   PRE:  Rate/Rhythm: 112 ST  BP:  Supine:   Sitting: 109/92  Standing:    SaO2: 98 RA  MODE:  Ambulation: 500 ft   POST:  Rate/Rhythm: 124 ST  BP:  Supine:   Sitting: 105/59  Standing:    SaO2: 97 RA [id number] Pt tolerated ambulation well without c/o of cp or SOB. HR 112 before walk 124 after. Continued MI and stent education with pt. He is voicing understanding about what has happened to him. His biggest challenge is going to be able to get to appointments since he nor his girlfriend drive or have a car.Pt rides his bicycle where he needs to go. States that he has just finished a computer course and that he has been trying to get a job. 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 He states that he does have some family that he can ask for rides, but he is concerned that their will be times that he will have to ride his bike or walk. Discussed smoking cessation with pt. We talked about tips for quitting sand the coaching contact line. He feels that he is going to need something to help his quit and knows that he can not do it cold [place]. We discussed medication and appointment compliance especially with Brilinta and Coumadin. He does voice understanding of this. I left him heart healthy diet sheet. We will continue to follow pt.  Melina Copa RN 08/09/2012 9:38 AM

## 2012-08-09 NOTE — Care Management (Signed)
Staff RN Marylu Lund asked RN Care Manager for assistance for pt with disability. CM did call the Financial Counselor and they are following his case to see if he will be eligible. No further needs from CM at this time. Gala Lewandowsky, RN, Care Manager [phone].

## 2012-08-09 NOTE — Progress Notes (Addendum)
TELEMETRY: Reviewed telemetry pt in NSR. No significant ectopy  Filed Vitals:   08/09/12 0000 08/09/12 0400 08/09/12 0500 08/09/12 0600  BP: 105/70 97/70  98/69  Pulse: 91     Temp: 98.6 F (37 C) 98.4 F (36.9 C)    TempSrc: Oral Oral    Resp: 12 18  14   Height:      Weight:   146 lb 13.2 oz (66.6 kg)   SpO2: 99% 98%      Intake/Output Summary (Last 24 hours) at 08/09/12 0651 Last data filed at 08/08/12 1800  Gross per 24 hour  Intake 1144.2 ml  Output    600 ml  Net  544.2 ml    SUBJECTIVE Patient has some soreness. Feels lightheaded when he gets up. No chest pain. Anxious about how he is going to handle his medications and follow up appointments.  LABS: Basic Metabolic Panel:  Recent Labs  16/10/96 1845 08/08/12 0014 08/09/12 0355  NA 138 136 134*  K 3.9 4.6 3.8  CL 99 97 97  CO2 18* 27 29  GLUCOSE 161* 128* 105*  BUN 10 7 9   CREATININE 0.79 0.77 0.90  CALCIUM 9.1 9.8 9.7  MG  --  2.3  --    Liver Function Tests:  Recent Labs  08/07/12 1845 08/08/12 0014  AST 35 155*  ALT 21 35  ALKPHOS 54 56  BILITOT 1.0 0.8  PROT 7.6 7.6  ALBUMIN 4.3 4.3    CBC:  Recent Labs  08/07/12 1845 08/08/12 0014 08/08/12 1939  WBC 8.5 10.2  --   HGB 13.5 14.6  --   HCT 38.6* 41.3  --   MCV 86.4 86.9  --   PLT 185 196 164   Cardiac Enzymes:  Recent Labs  08/07/12 1845 08/07/12 2052 08/08/12 0246 08/08/12 0810  CKTOTAL 202  --   --   --   CKMB 10.9*  --   --   --   TROPONINI 1.42* 13.24* >20.00* >20.00*   Hemoglobin A1C:  Recent Labs  08/07/12 1845  HGBA1C 5.1   Fasting Lipid Panel:  Recent Labs  08/08/12 0014  CHOL 211*  HDL 80  LDLCALC 114*  TRIG 87  CHOLHDL 2.6   Radiology/Studies:  Portable Chest X-ray 1 View  08/07/2012   *RADIOLOGY REPORT*  Clinical Data: Myocardial infarction.  PORTABLE CHEST - 1 VIEW  Comparison: None.  Findings: The heart size is normal.  No edema or pulmonary consolidation is identified.  No pleural fluid  is seen.  IMPRESSION: No active disease.   Original Report Authenticated By: Irish Lack, M.D.   Ecg: NSR, persistent ST elevation V1-V6. Q waves Avl, V1-2.   Echo:Study Conclusions  Left ventricle: Akinesis of the anterior wall and the anterior septum. Apex is akinetic or dyskinetic. Findings are most consistent with apical clot. The cavity size was normal. Wall thickness was increased in a pattern of mild LVH. Systolic function was moderately reduced. The estimated ejection fraction was in the range of 35% to 40%.   PHYSICAL EXAM General: Well developed, thin, in no acute distress. Head: Normocephalic, atraumatic, sclera non-icteric, no xanthomas, nares are without discharge. Dental caries. Neck: Negative for carotid bruits. JVD not elevated. Lungs: Clear bilaterally to auscultation without wheezes, rales, or rhonchi. Breathing is unlabored. Heart: RRR S1 S2 without murmurs, rubs, or gallops.  Abdomen: Soft, non-tender, non-distended with normoactive bowel sounds. No hepatomegaly. No rebound/guarding. No obvious abdominal masses. Msk:  Strength and tone appears normal  for age. Extremities: No clubbing, cyanosis or edema.  Distal pedal pulses are 2+ and equal bilaterally. Right groin without hematoma.  Neuro: Alert and oriented X 3. Moves all extremities spontaneously. Psych:  Responds to questions appropriately with a normal affect.  ASSESSMENT AND PLAN: 1. Anterior STEMI s/p stenting of the ostial LAD with thrombus extraction of mid to distal LAD. Some residual ST elevation post reperfusion with large infarct and persistent occlusion of distal LAD.  2. Kirby Funk. Secondary to MI and reperfusion. Ectopy resolved. 3. LV thrombus will apical dyskinesis. Will need full anticoagulation. On SQ Lovenox. Coumadin started. Will check P2Y12. If therapeutic will continue Brilinta alone and DC ASA to reduce bleeding risk. 4. Tobacco abuse- discussed smoking cessation. 5. Hypercholesterolemia.  On high dose statin. 6. LV dysfunction EF 35-40% by Echo. No overt CHF 7. HTN blood pressure low on meds. Continue to monitor.    Principal Problem:   ST elevation myocardial infarction (STEMI) of anterior wall, initial episode of care Active Problems:   Hypertension    Tobacco abuse   Ventricular tachycardia    Signed, Peter Swazil[place] MD,FACC 08/09/2012 6:51 AM]

## 2012-08-09 NOTE — Progress Notes (Signed)
ANTICOAGULATION CONSULT NOTE - Follow Up Consult  Pharmacy Consult for Lovenox and Coumadin Indication: LV thrombus  No Known Allergies  Patient Measurements: Height: 6\' 1"  (185.4 cm) Weight: 146 lb 13.2 oz (66.6 kg) IBW/kg (Calculated) : 79.9  Labs:  Recent Labs  08/07/12 1843  08/07/12 1845 08/07/12 2052 08/08/12 0014 08/08/12 0246 08/08/12 0810 08/08/12 1939 08/09/12 0355  HGB 14.6  --  13.5  --  14.6  --   --   --   --   HCT 43.0  --  38.6*  --  41.3  --   --   --   --   PLT  --   --  185  --  196  --   --  164  --   APTT  --   --  170*  --   --   --   --   --   --   LABPROT  --   --  62.9*  --  13.7  --   --   --  13.5  INR  --   --  8.31*  --  1.06  --   --   --  1.04  CREATININE 0.70  --  0.79  --  0.77  --   --   --  0.90  CKTOTAL  --   --  202  --   --   --   --   --   --   CKMB  --   --  10.9*  --   --   --   --   --   --   TROPONINI  --   < > 1.42* 13.24*  --  >20.00* >20.00*  --   --   < > = values in this interval not displayed.  Estimated Creatinine Clearance: 87.4 ml/min (by C-G formula based on Cr of 0.9).      Assessment: 57 y/o male patient s/p code STEMI with successful PCI.  ECHO revealed apical clot, and pharmacy asked to begin full-dose Lovenox and Coumadin yesterday.  Baseline INR WNL. INR subtherapeutic, anticipate INR rise tomorrow.  Goal of Therapy:  Anti Xa level 0.6-1.2, 4 hrs after lovenox dose Monitor platelets by anticoagulation protocol: Yes   Plan:  Continue lovenox 1mg /kg q12h Repeat coumadin 5mg  today and f/u daily protime.  Verlene Mayer, PharmD, BCPS Pager [phone]  08/09/2012 9:10 AM

## 2012-08-10 LAB — PROTIME-INR
INR: 1.3 (ref 0.00–1.49)
Prothrombin Time: 15.9 seconds — ABNORMAL HIGH (ref 11.6–15.2)

## 2012-08-10 MED ORDER — WARFARIN SODIUM 5 MG PO TABS
5.0000 mg | ORAL_TABLET | Freq: Once | ORAL | Status: AC
  Administered 2012-08-10: 5 mg via ORAL
  Filled 2012-08-10: qty 1

## 2012-08-10 MED ORDER — LISINOPRIL 5 MG PO TABS
5.0000 mg | ORAL_TABLET | Freq: Every day | ORAL | Status: DC
  Administered 2012-08-10 – 2012-08-13 (×4): 5 mg via ORAL
  Filled 2012-08-10 (×6): qty 1

## 2012-08-10 NOTE — Progress Notes (Signed)
CARDIAC REHAB PHASE I   PRE:  Rate/Rhythm: 74 SR  BP:  Supine:   Sitting: 116/67  Standing:    SaO2: 97 RA  MODE:  Ambulation: 550 ft   POST:  Rate/Rhythm: 93  BP:  Supine:   Sitting: 96/42  Standing:    SaO2: 99 RA 1150-1210 Pt tolerated ambulation well without c/o of cp or SOB. VS stable. Reinforce compliance of medications and discussed medications.   Melina Copa RN 08/10/2012 12:14 PM

## 2012-08-10 NOTE — Progress Notes (Signed)
ANTICOAGULATION CONSULT NOTE - Follow Up Consult  Pharmacy Consult for Lovenox + Coumadin Indication: LV thrombus  No Known Allergies  Patient Measurements: Height: 6\' 1"  (185.4 cm) Weight: 146 lb 13.2 oz (66.6 kg) IBW/kg (Calculated) : 79.9  Vital Signs: Temp: 98.2 F (36.8 C) (06/13 0500) BP: 105/64 mmHg (06/13 0915) Pulse Rate: 76 (06/13 0734)  Labs:  Recent Labs  08/07/12 1843  08/07/12 1845 08/07/12 2052 08/08/12 0014 08/08/12 0246 08/08/12 0810 08/08/12 1939 08/09/12 0355 08/10/12 0410  HGB 14.6  --  13.5  --  14.6  --   --   --   --   --   HCT 43.0  --  38.6*  --  41.3  --   --   --   --   --   PLT  --   --  185  --  196  --   --  164  --   --   APTT  --   --  170*  --   --   --   --   --   --   --   LABPROT  --   < > 62.9*  --  13.7  --   --   --  13.5 15.9*  INR  --   < > 8.31*  --  1.06  --   --   --  1.04 1.30  CREATININE 0.70  --  0.79  --  0.77  --   --   --  0.90  --   CKTOTAL  --   --  202  --   --   --   --   --   --   --   CKMB  --   --  10.9*  --   --   --   --   --   --   --   TROPONINI  --   < > 1.42* 13.24*  --  >20.00* >20.00*  --   --   --   < > = values in this interval not displayed.  Estimated Creatinine Clearance: 87.4 ml/min (by C-G formula based on Cr of 0.9).  Assessment: 57yom continues on lovenox to coumadin bridge for LV thrombus. INR remains subtherapeutic after 2 doses of coumadin, but trending up. No BMET today but renal function has been stable with CrCl 91ml/min. No CBC today. No bleeding reported.  Goal of Therapy:  INR 2-3 Anti-Xa level 0.6-1.2 Monitor platelets by anticoagulation protocol: Yes   Plan:  1) Continue lovenox 70mg  sq q12 2) Repeat coumadin 5mg  x 1 tonight 3) Follow up INR  Fredrik Rigger 08/10/2012,11:32 AM

## 2012-08-10 NOTE — Progress Notes (Signed)
TELEMETRY: Reviewed telemetry pt in NSR. Frequent PACs.  Filed Vitals:   08/09/12 2100 08/10/12 0500 08/10/12 0730 08/10/12 0734  BP: 111/73 105/64 107/71   Pulse: 92 87  76  Temp: 99.4 F (37.4 C) 98.2 F (36.8 C)    TempSrc:      Resp: 18 18    Height:      Weight:      SpO2: 100% 100%      Intake/Output Summary (Last 24 hours) at 08/10/12 0751 Last data filed at 08/09/12 1800  Gross per 24 hour  Intake    240 ml  Output      0 ml  Net    240 ml    SUBJECTIVE Feels very well. No chest pain. No SOB. Anxious about how he is going to handle his medications and follow up appointments. Planning to apply for disability and Medicaid.  LABS: Basic Metabolic Panel:  Recent Labs  27/25/36 1845 08/08/12 0014 08/09/12 0355  NA 138 136 134*  K 3.9 4.6 3.8  CL 99 97 97  CO2 18* 27 29  GLUCOSE 161* 128* 105*  BUN 10 7 9   CREATININE 0.79 0.77 0.90  CALCIUM 9.1 9.8 9.7  MG  --  2.3  --    Liver Function Tests:  Recent Labs  08/07/12 1845 08/08/12 0014  AST 35 155*  ALT 21 35  ALKPHOS 54 56  BILITOT 1.0 0.8  PROT 7.6 7.6  ALBUMIN 4.3 4.3    CBC:  Recent Labs  08/07/12 1845 08/08/12 0014 08/08/12 1939  WBC 8.5 10.2  --   HGB 13.5 14.6  --   HCT 38.6* 41.3  --   MCV 86.4 86.9  --   PLT 185 196 164   Cardiac Enzymes:  Recent Labs  08/07/12 1845 08/07/12 2052 08/08/12 0246 08/08/12 0810  CKTOTAL 202  --   --   --   CKMB 10.9*  --   --   --   TROPONINI 1.42* 13.24* >20.00* >20.00*   Hemoglobin A1C:  Recent Labs  08/07/12 1845  HGBA1C 5.1   Fasting Lipid Panel:  Recent Labs  08/08/12 0014  CHOL 211*  HDL 80  LDLCALC 114*  TRIG 87  CHOLHDL 2.6   Radiology/Studies:  Portable Chest X-ray 1 View  08/07/2012   *RADIOLOGY REPORT*  Clinical Data: Myocardial infarction.  PORTABLE CHEST - 1 VIEW  Comparison: None.  Findings: The heart size is normal.  No edema or pulmonary consolidation is identified.  No pleural fluid is seen.  IMPRESSION:  No active disease.   Original Report Authenticated By: Irish Lack, M.D.   Ecg: NSR, persistent ST elevation V1-V6. Q waves Avl, V1-2.   Echo:Study Conclusions  Left ventricle: Akinesis of the anterior wall and the anterior septum. Apex is akinetic or dyskinetic. Findings are most consistent with apical clot. The cavity size was normal. Wall thickness was increased in a pattern of mild LVH. Systolic function was moderately reduced. The estimated ejection fraction was in the range of 35% to 40%.   PHYSICAL EXAM General: Well developed, thin, in no acute distress. Head: Normocephalic, atraumatic, sclera non-icteric, no xanthomas, nares are without discharge. Dental caries. Neck: Negative for carotid bruits. JVD not elevated. Lungs: Clear bilaterally to auscultation without wheezes, rales, or rhonchi. Breathing is unlabored. Heart: RRR S1 S2 without murmurs, rubs, or gallops.  Abdomen: Soft, non-tender, non-distended with normoactive bowel sounds. No hepatomegaly. No rebound/guarding. No obvious abdominal masses. Msk:  Strength and  tone appears normal for age. Extremities: No clubbing, cyanosis or edema.  Distal pedal pulses are 2+ and equal bilaterally. Right groin without hematoma.  Neuro: Alert and oriented X 3. Moves all extremities spontaneously. Psych:  Responds to questions appropriately with a normal affect.  ASSESSMENT AND PLAN: 1. Anterior STEMI s/p stenting of the ostial LAD with thrombus extraction of mid to distal LAD. Some residual ST elevation post reperfusion with large infarct and persistent occlusion of distal LAD at the apex. No recurrent angina.  2. Kirby Funk. Secondary to MI and reperfusion. Ventricular ectopy resolved. 3. LV thrombus will apical dyskinesis. Will need full anticoagulation. On SQ Lovenox. Coumadin started. INR 1.3 today.  P2Y12 only 8. Will DC ASA and continue Brilinta. 4. Tobacco abuse- discussed smoking cessation. Ready to quit. 5.  Hypercholesterolemia. On high dose statin. 6. LV dysfunction EF 35-40% by Echo. No overt CHF 7. HTN   Plan: anticipate DC once INR therapeutic. Generic meds as much as possible. Will need close follow up as outpatient. Patient lives in [place] so will arrange follow up in our office there.    Principal Problem:   ST elevation myocardial infarction (STEMI) of anterior wall, initial episode of care Active Problems:   Hypertension    Tobacco abuse   Ventricular tachycardia   LV (left ventricular) mural thrombus with acute MI    Signed, Peter Swazil[place] MD,FACC 08/10/2012 7:51 AM]

## 2012-08-11 LAB — CBC
HCT: 38.8 % — ABNORMAL LOW (ref 39.0–52.0)
Hemoglobin: 13.3 g/dL (ref 13.0–17.0)
RBC: 4.41 MIL/uL (ref 4.22–5.81)
WBC: 6.9 10*3/uL (ref 4.0–10.5)

## 2012-08-11 LAB — PROTIME-INR
INR: 1.39 (ref 0.00–1.49)
Prothrombin Time: 16.7 seconds — ABNORMAL HIGH (ref 11.6–15.2)

## 2012-08-11 MED ORDER — MAGNESIUM HYDROXIDE 400 MG/5ML PO SUSP
30.0000 mL | Freq: Every day | ORAL | Status: DC | PRN
  Administered 2012-08-11 – 2012-08-16 (×2): 30 mL via ORAL
  Filled 2012-08-11 (×2): qty 30

## 2012-08-11 MED ORDER — WARFARIN SODIUM 6 MG PO TABS
6.0000 mg | ORAL_TABLET | Freq: Once | ORAL | Status: AC
  Administered 2012-08-11: 6 mg via ORAL
  Filled 2012-08-11: qty 1

## 2012-08-11 NOTE — Progress Notes (Signed)
Patient Name: Edward Cooper      SUBJECTIVE: admitted 6.10 with chest pain>>STEMI- anterior wall Cx by VT PTCA/STENT-DES of LAD; mod LV dysfunction EF 35-45% w apical thrombus needing anticoagulation coumadin and ticagelor alone On BB and ACE Without complaint but boredom  Past Medical History  Diagnosis Date  . Tobacco use   . Myocardial infarction   . Coronary artery disease   . GERD (gastroesophageal reflux disease)   . Seizures   . Alcoholism     PHYSICAL EXAM Filed Vitals:   08/10/12 1750 08/10/12 2100 08/11/12 0500 08/11/12 0800  BP: 135/80 108/73 98/64 92/64   Pulse: 86 91 76 91  Temp:  98.8 F (37.1 C) 98.3 F (36.8 C)   TempSrc:      Resp:  18 16   Height:      Weight:   146 lb (66.225 kg)   SpO2:  100% 100%     Well developed and nourished in no acute distress HENT normal Neck supple with JVP-flat Clear Regular rate and rhythm, no murmurs or gallops Abd-soft with active BS No Clubbing cyanosis edema Skin-warm and dry A & Oriented  Grossly normal sensory and motor function   TELEMETRY: Reviewed telemetry pt in nsr:    Intake/Output Summary (Last 24 hours) at 08/11/12 0941 Last data filed at 08/10/12 1756  Gross per 24 hour  Intake    480 ml  Output      0 ml  Net    480 ml    LABS: Basic Metabolic Panel:  Recent Labs Lab 08/07/12 1843  08/07/12 1845 08/08/12 0014 08/09/12 0355  NA 129*  --  138 136 134*  K 4.3  --  3.9 4.6 3.8  CL 96  --  99 97 97  CO2  --   --  18* 27 29  GLUCOSE 137*  --  161* 128* 105*  BUN 8  --  10 7 9   CREATININE 0.70  --  0.79 0.77 0.90  CALCIUM  --   < > 9.1 9.8 9.7  MG  --   --   --  2.3  --   < > = values in this interval not displayed. Cardiac Enzymes: No results found for this basename: CKTOTAL, CKMB, CKMBINDEX, TROPONINI,  in the last 72 hours CBC:  Recent Labs Lab 08/07/12 1843 08/07/12 1845 08/08/12 0014 08/08/12 1939 08/11/12 0034  WBC  --  8.5 10.2  --  6.9  HGB 14.6 13.5 14.6  --   13.3  HCT 43.0 38.6* 41.3  --  38.8*  MCV  --  86.4 86.9  --  88.0  PLT  --  185 196 164 143*   PROTIME:  Recent Labs  08/09/12 0355 08/10/12 0410 08/11/12 0430  LABPROT 13.5 15.9* 16.7*  INR 1.04 1.30 1.39   Liver Function Tests: No results found for this basename: AST, ALT, ALKPHOS, BILITOT, PROT, ALBUMIN,  in the last 72 hours No results found for this basename: LIPASE, AMYLASE,  in the last 72 hours BNP: BNP (last 3 results)  Recent Labs  08/07/12 2052  PROBNP 203.8*    ASSESSMENT AND PLAN:  Principal Problem:   ST elevation myocardial infarction (STEMI) of anterior wall, initial episode of care Active Problems:   Hypertension    Tobacco abuse   Ventricular tachycardia   LV (left ventricular) mural thrombus with acute MI  reasonalbe understanding of issue and plan Await therapeutic INR Continue ACE and BB  No CHF  symptoms so no indication for Aldactone  Signed, Sherryl Manges MD  08/11/2012

## 2012-08-11 NOTE — Progress Notes (Addendum)
On shift assessment, noted nickel size hematoma to right groin with faint bruit present.  Confirmed by second RN assessment.  Pt with 2+ DPP present, pt denies pain and states dressing was removed this am but unsure if knot was present at that time.  Site marked,  instructed pt to remain on bedrest tonight and  Dr. Katha Cabal notified with orders for U/S eval in am.  Will continue to monitor closely.

## 2012-08-11 NOTE — Progress Notes (Signed)
ANTICOAGULATION CONSULT NOTE - Follow Up Consult  Pharmacy Consult for Lovenox + Coumadin Indication: LV thrombus  No Known Allergies  Patient Measurements: Height: 6\' 1"  (185.4 cm) Weight: 146 lb (66.225 kg) IBW/kg (Calculated) : 79.9  Vital Signs: Temp: 98.3 F (36.8 C) (06/14 0500) BP: 92/64 mmHg (06/14 0800) Pulse Rate: 91 (06/14 0800)  Labs:  Recent Labs  08/08/12 1939 08/09/12 0355 08/10/12 0410 08/11/12 0034 08/11/12 0430  HGB  --   --   --  13.3  --   HCT  --   --   --  38.8*  --   PLT 164  --   --  143*  --   LABPROT  --  13.5 15.9*  --  16.7*  INR  --  1.04 1.30  --  1.39  CREATININE  --  0.90  --   --   --     Estimated Creatinine Clearance: 86.8 ml/min (by C-G formula based on Cr of 0.9).  Assessment:: 57 yo male with no hx CAD, admitted with chest pain x 12 hrs.; STEMI. Cath revealed single vessel CAD with ruptured plaque in LAD - s/p successful stenting and thrombus extraction.  Anticoagulation: Lovenox/Coumadin for new LV thrombus. INR up slightly to 1.39. CrCl 86, Hb stable. Plts 143  Infectious Disease: WBC WNL, afebrile  Cardiovascular: s/p PCI, Brilinta load in cath lab. Having runs for AIVR and VT up to 19 beats. Some residual ST elevation post-reperfusion due to large infarct and persisent occlusion of distal LAD. EF 35-40% Meds: atorvastatin 80mg , coreg, lisinopril, ticagrelor.   Endocrinology: No hx DM, cbg wnl  Gastrointestinal / Nutrition: heart healthy diet; ppi  Nephrology: Scr 0.9  Pulm: tobacco abuse.  Hematology / Oncology: CBC stable. Watch platelets  PTA Medication Issues: only home meds calcium and ibuprofen  Best Practices: lovenox/coumadin   Goal of Therapy:  INR 2-3 Anti-Xa level 0.6-1.2 Monitor platelets by anticoagulation protocol: Yes   Plan:  1) Continue lovenox 70mg  sq q12 2) Repeat coumadin 6mg  x 1 tonight 3) Follow up INR daily  Misty Stanley Stillinger 08/11/2012,8:45 AM

## 2012-08-12 ENCOUNTER — Inpatient Hospital Stay (HOSPITAL_COMMUNITY): Payer: Medicaid Other

## 2012-08-12 DIAGNOSIS — I255 Ischemic cardiomyopathy: Secondary | ICD-10-CM

## 2012-08-12 DIAGNOSIS — I8290 Acute embolism and thrombosis of unspecified vein: Secondary | ICD-10-CM

## 2012-08-12 DIAGNOSIS — S301XXA Contusion of abdominal wall, initial encounter: Secondary | ICD-10-CM

## 2012-08-12 DIAGNOSIS — I743 Embolism and thrombosis of arteries of the lower extremities: Secondary | ICD-10-CM

## 2012-08-12 LAB — CBC
HCT: 38.3 % — ABNORMAL LOW (ref 39.0–52.0)
Hemoglobin: 13.3 g/dL (ref 13.0–17.0)
MCHC: 34.7 g/dL (ref 30.0–36.0)
RBC: 4.35 MIL/uL (ref 4.22–5.81)
WBC: 6.7 10*3/uL (ref 4.0–10.5)

## 2012-08-12 LAB — PROTIME-INR
INR: 1.43 (ref 0.00–1.49)
Prothrombin Time: 17.1 seconds — ABNORMAL HIGH (ref 11.6–15.2)

## 2012-08-12 LAB — HEPARIN LEVEL (UNFRACTIONATED): Heparin Unfractionated: 0.22 IU/mL — ABNORMAL LOW (ref 0.30–0.70)

## 2012-08-12 MED ORDER — WARFARIN SODIUM 7.5 MG PO TABS
7.5000 mg | ORAL_TABLET | Freq: Once | ORAL | Status: DC
  Filled 2012-08-12: qty 1

## 2012-08-12 MED ORDER — HEPARIN (PORCINE) IN NACL 100-0.45 UNIT/ML-% IJ SOLN
800.0000 [IU]/h | INTRAMUSCULAR | Status: DC
  Administered 2012-08-12: 800 [IU]/h via INTRAVENOUS
  Filled 2012-08-12: qty 250

## 2012-08-12 MED ORDER — HEPARIN (PORCINE) IN NACL 100-0.45 UNIT/ML-% IJ SOLN
1100.0000 [IU]/h | INTRAMUSCULAR | Status: DC
  Administered 2012-08-13: 1100 [IU]/h via INTRAVENOUS
  Filled 2012-08-12 (×2): qty 250

## 2012-08-12 MED ORDER — EPTIFIBATIDE BOLUS VIA INFUSION
180.0000 ug/kg | Freq: Once | INTRAVENOUS | Status: AC
  Administered 2012-08-12: 12200 ug via INTRAVENOUS
  Filled 2012-08-12: qty 17

## 2012-08-12 MED ORDER — IOHEXOL 300 MG/ML  SOLN
80.0000 mL | Freq: Once | INTRAMUSCULAR | Status: AC | PRN
  Administered 2012-08-12: 80 mL via INTRAVENOUS

## 2012-08-12 MED ORDER — EPTIFIBATIDE 75 MG/100ML IV SOLN
2.0000 ug/kg/min | INTRAVENOUS | Status: DC
  Administered 2012-08-12 – 2012-08-16 (×7): 2 ug/kg/min via INTRAVENOUS
  Filled 2012-08-12 (×14): qty 100

## 2012-08-12 NOTE — Consult Note (Addendum)
Vascular Surgery Consultation  Reason for Consult: Thrombus in right femoral artery and small hematoma  HPI: Edward Cooper is a 57 y.o. male who presents for evaluation of thrombus in right femoral artery and small hematoma. Patient was admitted on June 10 with acute myocardial infarction and had stenting of LAD. Also was found to have thrombus in the atrium. Patient has small hematoma in the right inguinal region and CT scan was obtained today. Patient does report that he has had some cramping in the right calf with ambulation yesterday. Denies any rest pain or numbness in right foot. Has no history of claudication in either lower remedy. Has no history of previous known coronary artery disease. CT scan obtained today was reviewed by me and does reveal small hematoma surrounding right common femoral artery with no evidence of pseudoaneurysm. There is thrombus in the right common femoral artery and the distal most aspect down to the origin of the superficial femoral but there is flow traversing this area. There is high origin of the right profunda.   Past Medical History  Diagnosis Date  . Tobacco use   . Myocardial infarction   . Coronary artery disease   . GERD (gastroesophageal reflux disease)   . Seizures   . Alcoholism    Past Surgical History  Procedure Laterality Date  . None    . Tonsillectomy     History   Social History  . Marital Status: Divorced    Spouse Name: N/A    Number of Children: N/A  . Years of Education: N/A   Occupational History  . Unemployed    Social History Main Topics  . Smoking status: Current Every Day Smoker -- 0.50 packs/day for 40 years  . Smokeless tobacco: None  . Alcohol Use: 3.6 oz/week    6 Cans of beer per week  . Drug Use: No  . Sexually Active: None   Other Topics Concern  . None   Social History Narrative   Patient lives alone in a trailer. There is no family history of premature coronary artery disease in either parent or any  other siblings.   History reviewed. No pertinent family history. No Known Allergies Prior to Admission medications   Medication Sig Start Date End Date Taking? Authorizing Provider  calcium carbonate (TUMS - DOSED IN MG ELEMENTAL CALCIUM) 500 MG chewable tablet Chew 1-2 tablets by mouth 3 (three) times daily as needed for heartburn.   Yes Historical Provider, MD  ibuprofen (ADVIL,MOTRIN) 200 MG tablet Take 200-400 mg by mouth every 6 (six) hours as needed for pain, fever or headache.   Yes Historical Provider, MD     Positive ROS: Denies chest pain, dyspnea on exertion, PND, orthopnea, claudication.  All other systems have been reviewed and were otherwise negative with the exception of those mentioned in the HPI and as above.  Physical Exam: Filed Vitals:   08/12/12 0500  BP: 108/61  Pulse: 60  Temp: 97.8 F (36.6 C)  Resp: 20    General: Alert, no acute distress HEENT: Normal for age Cardiovascular: Regular rate and rhythm. Carotid pulses 2+, no bruits audible Respiratory: Clear to auscultation. No cyanosis, no use of accessory musculature GI: No organomegaly, abdomen is soft and non-tender Skin: No lesions in the area of chief complaint Neurologic: Sensation intact distally Psychiatric: Patient is competent for consent with normal mood and affect Musculoskeletal: No obvious deformities Extremities: Right inguinal region with 2 cm x 2 cm hematoma which is nonpulsatile. Right leg  with 2-3+ femoral pulse palpable. 2+ dorsalis pedis pulse palpable right foot. Right foot slightly cool but adequately perfused with good motion and sensation and no calf tenderness. Left leg with 3+ femoral 2+ popliteal 2+ dorsalis pedis pulse palpable.  Labs reviewed: INR 1.4  Imaging reviewed: As noted above-I have reviewed CT angiogram which reveals thrombus in right common femoral artery causing significant obstruction. Duplex scan of right femoral artery reveals partial obstruction with sluggish  flow distally  ABI pending  Assessment/Plan:  #1 partial obstruction right common femoral artery with small hematoma surrounding femoral artery-no evident pseudoaneurysm #2 significant acute myocardial infarction 5 days ago with LAD stent #3 atrial thrombus #4 patient currently on Coumadin, Lovenox bridge, and brilinta  Discuss situation at length with Dr. Graciela Husbands to determine safest approach to repair right femoral artery. Patient needs to be off of brilinta -ideally for 4 days or more-but there is concern about thrombosis of LAD stent One option would be to bridge with Integrilin drip-while off brilinta  Dr. Graciela Husbands has discussed this with his colleagues and feel that safest plan would be to bridge patient with Integrilin and heparin drip until 6 hours prior to surgical procedure on Wednesday The patient developed expanding hematoma or progressive ischemia right leg with need to intervene earlier    Josephina Gip, MD 08/12/2012 9:45 AM

## 2012-08-12 NOTE — Progress Notes (Signed)
Pt hematoma has increased to approx half dollar in size. Pt denies any pain, VSS, MD aware and coming to bedside to evaluate.

## 2012-08-12 NOTE — Progress Notes (Signed)
ANTICOAGULATION CONSULT NOTE - Follow Up Consult  Pharmacy Consult for Lovenox + Coumadin>>>Integrilin + Heparin Indication: LV thrombus, new femoral thrombus  No Known Allergies  Patient Measurements: Height: 6\' 1"  (185.4 cm) Weight: 149 lb (67.586 kg) IBW/kg (Calculated) : 79.9  Vital Signs: Temp: 98.5 F (36.9 C) (06/15 1325) Temp src: Oral (06/15 1325) BP: 104/69 mmHg (06/15 1325) Pulse Rate: 75 (06/15 1325)  Labs:  Recent Labs  08/10/12 0410 08/11/12 0034 08/11/12 0430 08/12/12 0505 08/12/12 0805 08/12/12 1803  HGB  --  13.3  --  13.3  --   --   HCT  --  38.8*  --  38.3*  --   --   PLT  --  143*  --  170  --   --   LABPROT 15.9*  --  16.7*  --  17.1*  --   INR 1.30  --  1.39  --  1.43  --   HEPARINUNFRC  --   --   --   --   --  0.22*    Estimated Creatinine Clearance: 88.7 ml/min (by C-G formula based on Cr of 0.9).  Assessment: 57 year old male being transition from coumadin/lovenox to heparin/integrilin. Initial heparin level low at 0.22. No bleeding issues noted by nursing. No issues with hematoma noted today (did have growth overnight) will continue to follow.  Heparin level goal = 0.3-0.5 while on integrilin  Plan: 1) Integrilin 52mcg/kg/min 2) Increase heparin 950 units/hr 3) 6 hour heparin level 4) Daily heparin level and CBC  Sheppard Coil PharmD., BCPS Clinical Pharmacist Pager [phone] 08/12/2012 7:59 PM

## 2012-08-12 NOTE — Progress Notes (Signed)
Late Entry - Dr. Lowella Dandy called results of CT to nursing at 0700 this am.  Per MD instruction, Corinda Gubler cards was paged to return call to Dr. Lowella Dandy for results.  Pt without complaints, no further change in cath site with 2+ DPP.

## 2012-08-12 NOTE — Progress Notes (Signed)
Patient Name: Edward Cooper Date of Encounter: 08/12/2012   Principal Problem:   ST elevation myocardial infarction (STEMI) of anterior wall, initial episode of care Active Problems:   CAD (coronary artery disease)   Tobacco abuse   LV (left ventricular) mural thrombus with acute MI   Non-occlusive thrombus - R Femoral Artery   Hypertension    Ventricular tachycardia   Groin hematoma   Cardiomyopathy, ischemic   SUBJECTIVE  No chest pain or sob.  Noted to have a hematoma yesterday with some growth last night.  CT performed this AM revealing a small hematoma w/ ? Small areas of bleeding w/o large PSA.  Incidentally noted focal non-occlusive thrombus (1.5cm in length) within the distal common femoral artery and involving the proximal SFA.  Pt has mild tenderness at site.  CURRENT MEDS . atorvastatin  80 mg Oral q1800  . carvedilol  6.25 mg Oral BID WC  . enoxaparin (LOVENOX) injection  70 mg Subcutaneous Q12H  . feeding supplement  237 mL Oral BID BM  . lisinopril  5 mg Oral Daily  . pantoprazole  40 mg Oral Daily  . sodium chloride  3 mL Intravenous Q12H  . Ticagrelor  90 mg Oral BID  . Warfarin - Pharmacist Dosing Inpatient   Does not apply q1800   OBJECTIVE  Filed Vitals:   08/11/12 1008 08/11/12 1255 08/11/12 2100 08/12/12 0500  BP: 102/62 95/63 119/74 108/61  Pulse:  90 79 60  Temp:  98.1 F (36.7 C) 98.3 F (36.8 C) 97.8 F (36.6 C)  TempSrc:  Oral    Resp:  18 20 20   Height:      Weight:    149 lb (67.586 kg)  SpO2:  100% 100% 100%    Intake/Output Summary (Last 24 hours) at 08/12/12 0753 Last data filed at 08/11/12 2146  Gross per 24 hour  Intake      6 ml  Output      0 ml  Net      6 ml   Filed Weights   08/09/12 0500 08/11/12 0500 08/12/12 0500  Weight: 146 lb 13.2 oz (66.6 kg) 146 lb (66.225 kg) 149 lb (67.586 kg)   PHYSICAL EXAM  General: Pleasant, NAD. Neuro: Alert and oriented X 3. Moves all extremities spontaneously. Psych: Normal  affect. HEENT:  Normal  Neck: Supple without bruits or JVD. Lungs:  Resp regular and unlabored, CTA. Heart: RRR no s3, s4, or murmurs. Abdomen: Soft, non-tender, non-distended, BS + x 4.  Extremities: No clubbing, cyanosis or edema. DP/PT 1+ bilat.  R fem cath site with ecchymosis and quarter sized hematoma.  No bleeding.  Bruit noted.  Accessory Clinical Findings  CBC  Recent Labs  08/11/12 0034 08/12/12 0505  WBC 6.9 6.7  HGB 13.3 13.3  HCT 38.8* 38.3*  MCV 88.0 88.0  PLT 143* 170   TELE  rsr  Radiology/Studies  Ct Pelvis W Contrast  08/12/2012   *RADIOLOGY REPORT*  Clinical Data:  Post cardiac cath with expanding the right groin hematoma.  CT PELVIS WITH CONTRAST  Technique:  Multidetector CT imaging of the pelvis was performed using the standard protocol following the bolus administration of intravenous contrast.  Contrast: 80mL OMNIPAQUE IOHEXOL 300 MG/ML  SOLN  Comparison:   None.  Findings:  There is contrast within the arterial system.  There is fluid and stranding in the right groin consistent with recent catheterization.  There is thrombus in the distal right common femoral artery and  extending into the proximal superficial femoral artery.  The thrombus roughly measures 1.5 cm in length.  There is flow in the superficial femoral artery beyond the thrombus. Incidentally, there is an early takeoff of a femoral profunda branch above the thrombus.  The fluid collection around the right common femoral artery measures 2.1 x 2.2 cm.  Small punctate densities along the anterior aspect of the hematoma on image 36. The small punctate densities may represent contrast and bleeding but no evidence for a large pseudoaneurysm.  The iliac arteries are patent.  No gross abnormality to the prostate, seminal vesicles or urinary bladder.  There is no significant free fluid within the pelvis.  No acute bony abnormality.  IMPRESSION: Small fluid collection around the right common femoral artery,  measuring 2.2 cm, is consistent with a small hematoma.  Punctate densities along the anterior of the hematoma could represent small areas of bleeding but there is no evidence for a large pseudoaneurysm.  There is focal thrombus within the distal common femoral artery and involving the proximal superficial femoral artery.  These results were called by telephone on 08/12/2012 at 07:05 a.m. to Dr. Brion Aliment, who verbally acknowledged these results.   Original Report Authenticated By: Richarda Overlie, M.D.   ASSESSMENT AND PLAN  1.  Acute ant STEMI/CAD:  S/p PCI/DES of the LAD on 6/10.  No chest pain.  Cont brilinta, bb, acei, statin.  No asa in setting of brilinta and coumadin.  2.  ICM:  EF 35-40%.  Euvolemic on exam.  Cont bb/acei.  3.  R groin hematoma with evidence of femoral thrombus:  Discussed with radiology.  Small hematoma without significant bleeding.  He has been on coumadin with lovenox bridge in the setting of LV thrombus.  PSA was not seen on CT however radiology recommends f/u u/s to r/o.  He does have a fairly loud bruit distal to stick site.  H/H have been stable.  Will d/w Dr. Graciela Husbands as he will require ongoing anticoagulation r/t LV thrombus and now femoral thrombus.  Cont to watch hematoma.  Consider vascular surgery evaluation.  4.  LV Thrombus:  See discussion above.  INR remains subRx.  5.  Tob Abuse: cessation advised.  6.  NSVT:  Resolved.  Cont bb.  7.  HTN:  Stable.  Signed, Nicolasa Ducking NP  reviewed with Dr Hart Rochester  Cx issue with need for antiplatlet therapy for stent, anticoagulation for LV thrombus, and now femoral thrombus with hematoma Have discussed with DR Hart Rochester and Katrinka Blazing  We will pursue the following course 1-stop ticagelor, coumadin and LMWH 2- begin integrilin single bolus and hep without bolus 3- anticipate semi elective surgery on Wed 4- follow hematoma  Have reviewed with patient

## 2012-08-12 NOTE — Progress Notes (Addendum)
ANTICOAGULATION CONSULT NOTE - Follow Up Consult  Pharmacy Consult for Lovenox + Coumadin>>>Integrilin + Heparin Indication: LV thrombus, new femoral thrombus  No Known Allergies  Patient Measurements: Height: 6\' 1"  (185.4 cm) Weight: 149 lb (67.586 kg) IBW/kg (Calculated) : 79.9  Vital Signs: Temp: 97.8 F (36.6 C) (06/15 0500) BP: 108/61 mmHg (06/15 0500) Pulse Rate: 60 (06/15 0500)  Labs:  Recent Labs  08/10/12 0410 08/11/12 0034 08/11/12 0430 08/12/12 0505 08/12/12 0805  HGB  --  13.3  --  13.3  --   HCT  --  38.8*  --  38.3*  --   PLT  --  143*  --  170  --   LABPROT 15.9*  --  16.7*  --  17.1*  INR 1.30  --  1.39  --  1.43    Estimated Creatinine Clearance: 88.7 ml/min (by C-G formula based on Cr of 0.9).  Assessment: 57yom continues on coumadin to lovenox bridge for LV thrombus. INR remains subtherapeutic and not moving much after 3 doses of coumadin. Will increase further today. CBC is stable. Patient developed a small hematoma of his right groin yesterday. CT scan of the area also revealed a thrombus in the distal common femoral artery extending into the proximal superficial femoral artery. Anticoagulation is to continue for now. Considering vascular surgery consult.  Goal of Therapy:  INR 2-3 Monitor platelets by anticoagulation protocol: Yes   Plan:  1) Increase coumadin to 7.5mg  x 1 tonight 2) Continue lovenox 70mg  sq q12 3) Follow up INR, anticoag plans in setting of hematoma and new femoral thrombus  Fredrik Rigger 08/12/2012,9:03 AM  Pharmacy has now received orders to change lovenox and coumadin to heparin as well as discontinue ticagrelor and start integrilin with tentative plans for surgery on Wednesday. Last lovenox dose was 6/14 @ 1717 so ok to start heparin now. Last ticagrelor dose was 6/14 @ 2146.  Heparin level goal = 0.3-0.5 while on integrilin  Plan: 1) Integrilin 150mcg/kg bolus x 1 (per Dr. Graciela Husbands) then 36mcg/kg/min 2) Start  heparin at 800 units/hr (no bolus) 3) 6 hour heparin level 4) Daily heparin level and CBC  Fredrik Rigger 08/12/2012, 10:12 AM

## 2012-08-12 NOTE — Progress Notes (Addendum)
VASCULAR LAB PRELIMINARY  ARTERIAL  Duplex imaging reveals thrombus noted at the distal common femoral at the bifurcation.  Minimal to no flow noted in the profunda or superficial femoral arteries until distal femoral.  Waveforms in distal femoral, popliteal, posterior tibial and anterior tibial arteries are monophasic.  Left CFA is triphasic.   Sherren Kerns, RVS 08/12/2012    ABI completed:    RIGHT    LEFT    PRESSURE WAVEFORM  PRESSURE WAVEFORM  BRACHIAL 119 Triphasic BRACHIAL Unable to obtain due to IV placement. Triphasic  DP  Unable to insonate DP 141 Biphasic  AT   AT    PT 61 Dampened monophasic PT 122 Biphasic  PER   PER    GREAT TOE  NA GREAT TOE  NA    RIGHT LEFT  ABI 0.51 1.18   The right ABI is suggestive of moderate, borderline severe arterial insufficiency. The left ABI is within normal limits. Unable to insonate the right dorsalis pedis artery.   08/12/2012 12:16 PM Gertie Fey, RVT, RDCS, RDMS

## 2012-08-12 NOTE — Progress Notes (Signed)
On- call cardiology  Patient presented with STEMI s/p PCI to LAD. He had LV apical thrombus and is on Brilinta, Coumadin and Lovenox (since coumadin is sub-therapeutic). Right groin dressing was removed for the first time today and nursing staff noted expanding hematoma per nursing assessment. On exam, there is a 4 cm hematoma in right groin with bruit. He has good distal pulse  He has no back pain.He has already received, ASA, Brilinta, Lovenox and Coumadin. I will hold all anti-coagulants and obtain a CT-pelvis with contrast to see if he has bleeding/fistula. Will check CBC.  Gemma Payor, MD

## 2012-08-13 DIAGNOSIS — I2589 Other forms of chronic ischemic heart disease: Secondary | ICD-10-CM

## 2012-08-13 DIAGNOSIS — I749 Embolism and thrombosis of unspecified artery: Secondary | ICD-10-CM

## 2012-08-13 DIAGNOSIS — I2510 Atherosclerotic heart disease of native coronary artery without angina pectoris: Secondary | ICD-10-CM

## 2012-08-13 LAB — CBC
MCV: 88.3 fL (ref 78.0–100.0)
Platelets: 167 10*3/uL (ref 150–400)
RDW: 12.2 % (ref 11.5–15.5)
WBC: 5.8 10*3/uL (ref 4.0–10.5)

## 2012-08-13 LAB — HEPARIN LEVEL (UNFRACTIONATED)
Heparin Unfractionated: 0.25 IU/mL — ABNORMAL LOW (ref 0.30–0.70)
Heparin Unfractionated: 0.52 IU/mL (ref 0.30–0.70)

## 2012-08-13 LAB — PROTIME-INR
INR: 1.28 (ref 0.00–1.49)
Prothrombin Time: 15.7 seconds — ABNORMAL HIGH (ref 11.6–15.2)

## 2012-08-13 MED ORDER — HEPARIN (PORCINE) IN NACL 100-0.45 UNIT/ML-% IJ SOLN
1000.0000 [IU]/h | INTRAMUSCULAR | Status: DC
  Administered 2012-08-13: 1050 [IU]/h via INTRAVENOUS
  Administered 2012-08-14 (×2): 1000 [IU]/h via INTRAVENOUS
  Filled 2012-08-13 (×3): qty 250

## 2012-08-13 NOTE — Progress Notes (Signed)
ANTICOAGULATION CONSULT NOTE - Follow Up Consult  Pharmacy Consult for Lovenox + Coumadin>>>Integrilin + Heparin Indication: LV thrombus, new femoral thrombus  No Known Allergies  Patient Measurements: Height: 6\' 1"  (185.4 cm) Weight: 149 lb (67.586 kg) IBW/kg (Calculated) : 79.9  Vital Signs: Temp: 98.6 F (37 C) (06/15 2043) Temp src: Oral (06/15 2043) BP: 105/62 mmHg (06/15 2043) Pulse Rate: 76 (06/15 2043)  Labs:  Recent Labs  08/11/12 0034 08/11/12 0430 08/12/12 0505 08/12/12 0805 08/12/12 1803 08/13/12 0149  HGB 13.3  --  13.3  --   --  12.5*  HCT 38.8*  --  38.3*  --   --  36.4*  PLT 143*  --  170  --   --  167  LABPROT  --  16.7*  --  17.1*  --  15.7*  INR  --  1.39  --  1.43  --  1.28  HEPARINUNFRC  --   --   --   --  0.22* 0.25*    Estimated Creatinine Clearance: 88.7 ml/min (by C-G formula based on Cr of 0.9).  Assessment: 57 year old male on heparin/integrilin for LV thrombus, new femoral thrombus and recent LAD stent. Anticipated vascular surgery on Wed. Heparin level remains low at 0.25. No bleeding issues noted by nursing. No issues with hematoma noted today (did have growth overnight) will continue to follow.  Heparin level goal = 0.3-0.5 while on integrilin  Plan: 1) Integrilin 93mcg/kg/min 2) Increase heparin to 1100 units/hr 3) 6 hour heparin level 4) Daily heparin level and CBC  Christoper Fabian, PharmD, BCPS Clinical pharmacist, pager [phone] 08/13/2012 2:37 AM

## 2012-08-13 NOTE — Progress Notes (Signed)
1100 Noted that pt on bedrest for femoral artery repair Weds.  We will follow up after surgery. Luetta Nutting RNBSN

## 2012-08-13 NOTE — Progress Notes (Signed)
ANTICOAGULATION CONSULT NOTE - Follow Up Consult  Pharmacy Consult for Lovenox + Coumadin>>>Integrilin + Heparin Indication: LV thrombus, new femoral thrombus  No Known Allergies  Patient Measurements: Height: 6\' 1"  (185.4 cm) Weight: 148 lb 13 oz (67.5 kg) IBW/kg (Calculated) : 79.9  Vital Signs: Temp: 98.5 F (36.9 C) (06/16 0534) Temp src: Oral (06/16 0534) BP: 100/66 mmHg (06/16 0653) Pulse Rate: 63 (06/16 0534)  Labs:  Recent Labs  08/11/12 0034 08/11/12 0430 08/12/12 0505 08/12/12 0805 08/12/12 1803 08/13/12 0149 08/13/12 0924  HGB 13.3  --  13.3  --   --  12.5*  --   HCT 38.8*  --  38.3*  --   --  36.4*  --   PLT 143*  --  170  --   --  167  --   LABPROT  --  16.7*  --  17.1*  --  15.7*  --   INR  --  1.39  --  1.43  --  1.28  --   HEPARINUNFRC  --   --   --   --  0.22* 0.25* 0.34    Estimated Creatinine Clearance: 88.5 ml/min (by C-G formula based on Cr of 0.9).  Assessment: 57 year old male on heparin/integrilin for LV thrombus, new femoral thrombus and recent LAD stent. Anticipated vascular surgery on Wed. Heparin level now at goal. No bleeding issues noted by nursing. No issues with hematoma today per nursing-will continue to follow.  Heparin level goal = 0.3-0.5 while on integrilin  Plan: 1) Cont Integrilin at 13mcg/kg/min 2) Cont heparin at 1100 units/hr 3) 6 hour heparin level to confirm 4) Daily heparin level and CBC  Wendie Simmer, PharmD, BCPS Clinical Pharmacist  Pager: [phone]

## 2012-08-13 NOTE — Progress Notes (Signed)
ANTICOAGULATION CONSULT NOTE - Follow Up Consult  Pharmacy Consult for Lovenox + Coumadin>>>Integrilin + Heparin Indication: LV thrombus, new femoral thrombus  No Known Allergies  Patient Measurements: Height: 6\' 1"  (185.4 cm) Weight: 152 lb 12.5 oz (69.3 kg) IBW/kg (Calculated) : 79.9  Vital Signs: Temp: 98.3 F (36.8 C) (06/16 1400) Temp src: Oral (06/16 1400) BP: 102/67 mmHg (06/16 1704) Pulse Rate: 80 (06/16 1724)  Labs:  Recent Labs  08/11/12 0034 08/11/12 0430 08/12/12 0505 08/12/12 0805  08/13/12 0149 08/13/12 0924 08/13/12 1601  HGB 13.3  --  13.3  --   --  12.5*  --   --   HCT 38.8*  --  38.3*  --   --  36.4*  --   --   PLT 143*  --  170  --   --  167  --   --   LABPROT  --  16.7*  --  17.1*  --  15.7*  --   --   INR  --  1.39  --  1.43  --  1.28  --   --   HEPARINUNFRC  --   --   --   --   < > 0.25* 0.34 0.52  < > = values in this interval not displayed.  Estimated Creatinine Clearance: 90.9 ml/min (by C-G formula based on Cr of 0.9).  Assessment: 57 year old male on heparin/integrilin for LV thrombus, new femoral thrombus and recent LAD stent. Anticipated vascular surgery on Wed. Heparin level now slightly above goal (HL= 0.52).   Goal Heparin level goal = 0.3-0.5 while on integrilin  Plan:  -Decrease heparin to 1050 units/hr -Will recheck a heparin level in am  Harland German, Pharm D 08/13/2012 5:36 PM

## 2012-08-13 NOTE — Progress Notes (Signed)
Patient Name: Edward Cooper Date of Encounter: 08/13/2012    Principal Problem:   ST elevation myocardial infarction (STEMI) of anterior wall, initial episode of care Active Problems:   CAD (coronary artery disease)   Tobacco abuse   LV (left ventricular) mural thrombus with acute MI   Non-occlusive thrombus   Hypertension    Ventricular tachycardia   Groin hematoma   Cardiomyopathy, ischemic    SUBJECTIVE  Patient c/o resolving 2/10 pain at right groin. Is on bed rest. Is bored and wants to leave hospital asap. Denies chest pain, sob, palpitations, abdominal pain, n/v.  CURRENT MEDS . atorvastatin  80 mg Oral q1800  . carvedilol  6.25 mg Oral BID WC  . feeding supplement  237 mL Oral BID BM  . lisinopril  5 mg Oral Daily  . pantoprazole  40 mg Oral Daily  . sodium chloride  3 mL Intravenous Q12H    OBJECTIVE  Filed Vitals:   2012-08-26 1325 08/26/2012 2043 08/13/12 0534 08/13/12 0653  BP: 104/69 105/62 90/54 100/66  Pulse: 75 76 63   Temp: 98.5 F (36.9 C) 98.6 F (37 C) 98.5 F (36.9 C)   TempSrc: Oral Oral Oral   Resp: 18 20 17    Height:      Weight:   148 lb 13 oz (67.5 kg)   SpO2: 100% 98% 97%     Intake/Output Summary (Last 24 hours) at 08/13/12 0733 Last data filed at 08/13/12 0537  Gross per 24 hour  Intake 2064.78 ml  Output   1200 ml  Net 864.78 ml   Filed Weights   08/11/12 0500 26-Aug-2012 0500 08/13/12 0534  Weight: 146 lb (66.225 kg) 149 lb (67.586 kg) 148 lb 13 oz (67.5 kg)    PHYSICAL EXAM  General: Pleasant, NAD. Neuro: Alert and oriented X 3. Moves all extremities spontaneously. Psych: Normal affect. HEENT:  Normal  Neck: Supple without bruits or JVD. Lungs:  Resp regular and unlabored, CTA. Heart: RRR no s3, s4, or murmurs. Abdomen: Soft, non-tender, non-distended, BS + x 4.  Extremities: No clubbing, cyanosis or edema. DP/PT ?non palp. R fem cath site with ecchymosis and quarter sized hematoma. No bleeding.  Bruit that was present  yesterday has resolved.  DP nonpalp on right, 1+ on left.  Accessory Clinical Findings  CBC  Recent Labs  August 26, 2012 0505 08/13/12 0149  WBC 6.7 5.8  HGB 13.3 12.5*  HCT 38.3* 36.4*  MCV 88.0 88.3  PLT 170 167   TELE  Nsr, 71 bpm  Radiology/Studies  Lower Extremity Duplex  26-Aug-2012: ARTERIAL Duplex imaging reveals thrombus noted at the distal common femoral at the bifurcation. Minimal to no flow noted in the profunda or superficial femoral arteries until distal femoral. Waveforms in distal femoral, popliteal, posterior tibial and anterior tibial arteries are monophasic. Left CFA is triphasic.   Ct Pelvis W Contrast  08/26/2012   *RADIOLOGY REPORT*  Clinical Data:  Post cardiac cath with expanding the right groin hematoma.  CT PELVIS WITH CONTRAST    IMPRESSION: Small fluid collection around the right common femoral artery, measuring 2.2 cm, is consistent with a small hematoma.  Punctate densities along the anterior of the hematoma could represent small areas of bleeding but there is no evidence for a large pseudoaneurysm.  There is focal thrombus within the distal common femoral artery and involving the proximal superficial femoral artery.  These results were called by telephone on 2012-08-26 at 07:05 a.m. to Dr. Brion Aliment, who verbally acknowledged  these results.   Original Report Authenticated By: Richarda Overlie, M.D.   ASSESSMENT AND PLAN  1. Acute anterior STEMI/CAD: s/p PCI/DES of the LAD on 6/10. No chest pain.  Brilinta, coumadin and LMWH stopped 6/15. Continue Integrilin and Heparin infusions.  Cont statin, bb.  He is not on asa, as this was d/c'd in the setting of brilinta and coumadin.  2. ICM: EF 35-40%. Euvolemic on exam. Continue bb and acei.  3. R groin hematoma with evidence of femoral thrombus. On bedrest.  Tentative plan for vasc surgery on Wednesday after brilinta wash out.  Cont heparin.  4. LV thrombus:  Cont heparin.  Will need to be bridged back to coumadin following  vasc surgery.  5. Tobacco Abuse: cessation advised.   6. NSVT: resolved. Cont bb  7. HTN: stable  Signed, Nicolasa Ducking NP Patient seen and examined and history reviewed. Agree with above findings and plan. Events of this weekend noted. New groin hematoma and thrombosis of the right remoral artery. Plan surgery Wed. After Brilinta wash out. Covering with IV heparin and Integrelin now. Post op I would reload with Brilinta 180 mg then 90 mg bid before stopping IV Integrelin. Will need to bridge Heparin to Coumadin post op for LV thrombus. Fortunately he has done well from a cardiac standpoint. Appreciate vascular surgery input.  Theron Arista [facility] 08/13/2012 12:06 PM

## 2012-08-13 NOTE — Progress Notes (Signed)
Patient ID: Edward Cooper, male   DOB: 17-Jan-1956, 57 y.o.   MRN: [id number] Vascular Surgery Progress Note  Subjective: Thrombus and right common femoral artery-status post anterior myocardial infarction with LAD stent and known atrial thrombus. Brilinta being held. On Integrilin drip and heparin drip  Objective:  Filed Vitals:   08/13/12 0653  BP: 100/66  Pulse:   Temp:   Resp:     General alert and oriented x3 in no apparent stress Lungs rhonchi or wheezing Right inguinal area with stable small hematoma proximally 2 cm in diameter to 3+ femoral pulse. Very weak dorsalis pedis pulse right foot 1+. No pain in right foot and numbness in right foot   Labs:  Recent Labs Lab 08/07/12 1845 08/08/12 0014 08/09/12 0355  CREATININE 0.79 0.77 0.90    Recent Labs Lab 08/07/12 1845 08/08/12 0014 08/09/12 0355  NA 138 136 134*  K 3.9 4.6 3.8  CL 99 97 97  CO2 18* 27 29  BUN 10 7 9   CREATININE 0.79 0.77 0.90  GLUCOSE 161* 128* 105*  CALCIUM 9.1 9.8 9.7    Recent Labs Lab 08/11/12 0034 08/12/12 0505 08/13/12 0149  WBC 6.9 6.7 5.8  HGB 13.3 13.3 12.5*  HCT 38.8* 38.3* 36.4*  PLT 143* 170 167    Recent Labs Lab 08/11/12 0430 08/12/12 0805 08/13/12 0149  INR 1.39 1.43 1.28    I/O last 3 completed shifts: In: 2067.8 [P.O.:720; I.V.:1347.8] Out: 1200 [Urine:1200]  Imaging: Ct Pelvis W Contrast  08/12/2012   *RADIOLOGY REPORT*  Clinical Data:  Post cardiac cath with expanding the right groin hematoma.  CT PELVIS WITH CONTRAST  Technique:  Multidetector CT imaging of the pelvis was performed using the standard protocol following the bolus administration of intravenous contrast.  Contrast: 80mL OMNIPAQUE IOHEXOL 300 MG/ML  SOLN  Comparison:   None.  Findings:  There is contrast within the arterial system.  There is fluid and stranding in the right groin consistent with recent catheterization.  There is thrombus in the distal right common femoral artery and extending  into the proximal superficial femoral artery.  The thrombus roughly measures 1.5 cm in length.  There is flow in the superficial femoral artery beyond the thrombus. Incidentally, there is an early takeoff of a femoral profunda branch above the thrombus.  The fluid collection around the right common femoral artery measures 2.1 x 2.2 cm.  Small punctate densities along the anterior aspect of the hematoma on image 36. The small punctate densities may represent contrast and bleeding but no evidence for a large pseudoaneurysm.  The iliac arteries are patent.  No gross abnormality to the prostate, seminal vesicles or urinary bladder.  There is no significant free fluid within the pelvis.  No acute bony abnormality.  IMPRESSION: Small fluid collection around the right common femoral artery, measuring 2.2 cm, is consistent with a small hematoma.  Punctate densities along the anterior of the hematoma could represent small areas of bleeding but there is no evidence for a large pseudoaneurysm.  There is focal thrombus within the distal common femoral artery and involving the proximal superficial femoral artery.  These results were called by telephone on 08/12/2012 at 07:05 a.m. to Dr. Brion Aliment, who verbally acknowledged these results.   Original Report Authenticated By: Richarda Overlie, M.D.    Assessment/Plan:   LOS: 6 days  s/p Procedure(s): LEFT HEART CATHETERIZATION WITH CORONARY ANGIOGRAM PERCUTANEOUS CORONARY STENT INTERVENTION (PCI-S)  Plan right femoral thrombectomy and patch angioplasty on Wednesday or  Will plan to DC Integrilin drip at 5 AM Wednesday morning and DC heparin drip on call to the OR  Continue to hold Filomena Jungling, MD 08/13/2012 8:49 AM

## 2012-08-14 ENCOUNTER — Encounter (HOSPITAL_COMMUNITY): Payer: Self-pay | Admitting: Anesthesiology

## 2012-08-14 LAB — BASIC METABOLIC PANEL
BUN: 12 mg/dL (ref 6–23)
Chloride: 99 mEq/L (ref 96–112)
Creatinine, Ser: 0.83 mg/dL (ref 0.50–1.35)
GFR calc Af Amer: >90 mL/min (ref >90)
GFR calc non Af Amer: >90 mL/min (ref >90)

## 2012-08-14 LAB — PROTIME-INR: Prothrombin Time: 13.7 seconds (ref 11.6–15.2)

## 2012-08-14 LAB — CBC
MCH: 30.6 pg (ref 26.0–34.0)
MCV: 88.3 fL (ref 78.0–100.0)
Platelets: 180 10*3/uL (ref 150–400)
RBC: 4.12 MIL/uL — ABNORMAL LOW (ref 4.22–5.81)

## 2012-08-14 MED ORDER — DEXTROSE 5 % IV SOLN
1.5000 g | INTRAVENOUS | Status: AC
  Filled 2012-08-14: qty 1.5

## 2012-08-14 MED FILL — Perflutren Lipid Microsphere IV Susp 1.1 MG/ML: INTRAVENOUS | Qty: 10 | Status: AC

## 2012-08-14 NOTE — Progress Notes (Signed)
Patient ID: Edward Cooper, male   DOB: [birth year], 57 y.o.   MRN: [id number] Vascular Surgery Progress Note  Subjective: Patient status post acute myocardial infarction 7 days ago with LAD stent. Now with thrombus in right common femoral artery. Patient currently on Integrilin drip and heparin drip.Brilinta on hold since Saturday. Denies any pain or numbness in the right foot. No change in right inguinal area per patient.  Objective:  Filed Vitals:   08/14/12 0830  BP: 110/70  Pulse: 72  Temp:   Resp:     General well-developed well-nourished thin male in no apparent stress alert and oriented x3 Right leg with stable small hematoma right inguinal area measuring 2 x 2 centimeters with 3+ femoral pulse. No palpable pulse right foot today. I could adequately perfused.   Labs:  Recent Labs Lab 08/07/12 1845 08/08/12 0014 08/09/12 0355  CREATININE 0.79 0.77 0.90    Recent Labs Lab 08/07/12 1845 08/08/12 0014 08/09/12 0355  NA 138 136 134*  K 3.9 4.6 3.8  CL 99 97 97  CO2 18* 27 29  BUN 10 7 9   CREATININE 0.79 0.77 0.90  GLUCOSE 161* 128* 105*  CALCIUM 9.1 9.8 9.7    Recent Labs Lab 08/12/12 0505 08/13/12 0149 08/14/12 0510  WBC 6.7 5.8 6.0  HGB 13.3 12.5* 12.6*  HCT 38.3* 36.4* 36.4*  PLT 170 167 180    Recent Labs Lab 08/12/12 0805 08/13/12 0149 08/14/12 0510  INR 1.43 1.28 1.06    I/O last 3 completed shifts: In: 2661.8 [P.O.:1320; I.V.:1341.8] Out: 3450 [Urine:3450]  Imaging: No results found.  Assessment/Plan:    LOS: 7 days  s/p Procedure(s): LEFT HEART CATHETERIZATION WITH CORONARY ANGIOGRAM PERCUTANEOUS CORONARY STENT INTERVENTION (PCI-S)  Scheduled for right femoral thrombectomy and probable patch angioplasty tomorrow a.m. Need to discontinue Integrilin drip at 5 AM stat Continue heparin drip and we'll discontinue on call to OR Discuss situation with patient and he is agreeable to proceed in a.m.   Josephina Gip, MD 08/14/2012 9:16  AM

## 2012-08-14 NOTE — Progress Notes (Signed)
ANTICOAGULATION CONSULT NOTE - Follow Up Consult  Pharmacy Consult for Lovenox + Coumadin>>>Integrilin + Heparin Indication: LV thrombus, new femoral thrombus  No Known Allergies  Patient Measurements: Height: 6\' 1"  (185.4 cm) Weight: 152 lb 5.4 oz (69.1 kg) IBW/kg (Calculated) : 79.9  Vital Signs: Temp: 98.1 F (36.7 C) (06/17 0534) Temp src: Oral (06/17 0534) BP: 94/58 mmHg (06/17 0739) Pulse Rate: 75 (06/17 0534)  Labs:  Recent Labs  08/12/12 0505 08/12/12 0805  08/13/12 0149 08/13/12 0924 08/13/12 1601 08/14/12 0510  HGB 13.3  --   --  12.5*  --   --  12.6*  HCT 38.3*  --   --  36.4*  --   --  36.4*  PLT 170  --   --  167  --   --  180  LABPROT  --  17.1*  --  15.7*  --   --  13.7  INR  --  1.43  --  1.28  --   --  1.06  HEPARINUNFRC  --   --   < > 0.25* 0.34 0.52 0.61  < > = values in this interval not displayed.  Estimated Creatinine Clearance: 90.6 ml/min (by C-G formula based on Cr of 0.9).  Assessment: 57 year old male on heparin/integrilin for LV thrombus, new femoral thrombus and recent LAD stent. Anticipated vascular surgery on Wed. Heparin level now above goal=0.61.   Goal Heparin level goal = 0.3-0.5 while on integrilin  Plan:  Decrease heparin to 1000 units/hr Check heparin level in 6 hours.  Wendie Simmer, PharmD, BCPS Clinical Pharmacist  Pager: [phone]

## 2012-08-14 NOTE — Progress Notes (Signed)
pts BP 90/58; pt asymptomatic, per Lucile Crater, PA hold lisinopril for today; will continue to monitor

## 2012-08-14 NOTE — Progress Notes (Signed)
    SUBJECTIVE: No chest pain or SOB.   BP 110/70  Pulse 72  Temp(Src) 98.1 F (36.7 C) (Oral)  Resp 18  Ht 6\' 1"  (1.854 m)  Wt 152 lb 5.4 oz (69.1 kg)  BMI 20.1 kg/m2  SpO2 100%  Intake/Output Summary (Last 24 hours) at 08/14/12 0911 Last data filed at 08/14/12 4098  Gross per 24 hour  Intake   1200 ml  Output   2450 ml  Net  -1250 ml    PHYSICAL EXAM General: Well developed, well nourished, in no acute distress. Alert and oriented x 3.  Psych:  Good affect, responds appropriately Neck: No JVD. No masses noted.  Lungs: Clear bilaterally with no wheezes or rhonci noted.  Heart: RRR with no murmurs noted. Abdomen: Bowel sounds are present. Soft, non-tender.  Extremities: No lower extremity edema. Right groin TTP, hematoma present.   LABS: CBC:  Recent Labs  08/13/12 0149 08/14/12 0510  WBC 5.8 6.0  HGB 12.5* 12.6*  HCT 36.4* 36.4*  MCV 88.3 88.3  PLT 167 180   Current Meds: . atorvastatin  80 mg Oral q1800  . carvedilol  6.25 mg Oral BID WC  . [START ON 08/15/2012] cefUROXime (ZINACEF)  IV  1.5 g Intravenous On Call to OR  . feeding supplement  237 mL Oral BID BM  . lisinopril  5 mg Oral Daily  . pantoprazole  40 mg Oral Daily  . sodium chloride  3 mL Intravenous Q12H     ASSESSMENT AND PLAN:  1. Acute anterior STEMI/CAD: s/p PCI/DES of the LAD on 08/07/12. No chest pain. Brilinta, coumadin and LMWH stopped 6/15. Continue Integrilin and Heparin infusions while awaiting surgery on right femoral artery. Cont statin, bb. He is not on asa, as this was d/c'd in the setting of brilinta and coumadin. Will stop Integrilin in am.   2. ICM: EF 35-40%. Euvolemic on exam. Continue beta blocker and Ace-inh.    3. R groin hematoma with evidence of femoral thrombus: On bedrest. Tentative plan for vasc surgery on Wednesday after brilinta wash out. Cont heparin drip while awaiting surgery.  4. LV thrombus: Cont heparin. Will need to be bridged back to coumadin following  vasc surgery.   5. Tobacco Abuse: Cessation advised.   6. NSVT: resolved. Cont beta blocker  7. HTN: stable    Edward Cooper,Edward Cooper  6/17/20149:11 AM

## 2012-08-14 NOTE — Progress Notes (Signed)
ANTICOAGULATION CONSULT NOTE - Follow Up Consult  Pharmacy Consult for Lovenox + Coumadin>>>Integrilin + Heparin Indication: LV thrombus, new femoral thrombus  No Known Allergies  Labs:  Recent Labs  08/12/12 0505 08/12/12 0805  08/13/12 0149  08/13/12 1601 08/14/12 0510 08/14/12 1443  HGB 13.3  --   --  12.5*  --   --  12.6*  --   HCT 38.3*  --   --  36.4*  --   --  36.4*  --   PLT 170  --   --  167  --   --  180  --   LABPROT  --  17.1*  --  15.7*  --   --  13.7  --   INR  --  1.43  --  1.28  --   --  1.06  --   HEPARINUNFRC  --   --   < > 0.25*  < > 0.52 0.61 0.40  < > = values in this interval not displayed.  Estimated Creatinine Clearance: 90.6 ml/min (by C-G formula based on Cr of 0.9).  Assessment: 57 year old male on heparin/integrilin for LV thrombus, new femoral thrombus and recent LAD stent. Anticipated vascular surgery on Wed. Heparin level now at goal at 0.40  Goal Heparin level goal = 0.3-0.5 while on integrilin  Plan:  Continue heparin at 1000 units/hr Follow up AM labs  Thank you. Okey Regal, PharmD [phone]

## 2012-08-15 ENCOUNTER — Encounter (HOSPITAL_COMMUNITY)
Admission: EM | Disposition: A | Discharge status: Home / Self Care | Payer: Self-pay | Source: Home / Self Care | Attending: Cardiology

## 2012-08-15 DIAGNOSIS — I2109 ST elevation (STEMI) myocardial infarction involving other coronary artery of anterior wall: Secondary | ICD-10-CM

## 2012-08-15 DIAGNOSIS — I2510 Atherosclerotic heart disease of native coronary artery without angina pectoris: Secondary | ICD-10-CM

## 2012-08-15 DIAGNOSIS — I739 Peripheral vascular disease, unspecified: Secondary | ICD-10-CM

## 2012-08-15 HISTORY — PX: LEFT HEART CATHETERIZATION WITH CORONARY ANGIOGRAM: SHX5451

## 2012-08-15 LAB — CBC
HCT: 34.3 % — ABNORMAL LOW (ref 39.0–52.0)
HCT: 33.6 % — ABNORMAL LOW (ref 39.0–52.0)
Hemoglobin: 11.8 g/dL — ABNORMAL LOW (ref 13.0–17.0)
MCV: 87.5 fL (ref 78.0–100.0)
MCV: 88 fL (ref 78.0–100.0)
RBC: 3.82 MIL/uL — ABNORMAL LOW (ref 4.22–5.81)
RDW: 12.2 % (ref 11.5–15.5)
WBC: 4.8 10*3/uL (ref 4.0–10.5)
WBC: 7.3 10*3/uL (ref 4.0–10.5)

## 2012-08-15 LAB — BASIC METABOLIC PANEL
BUN: 11 mg/dL (ref 6–23)
Chloride: 100 mEq/L (ref 96–112)
Creatinine, Ser: 0.84 mg/dL (ref 0.50–1.35)
GFR calc Af Amer: >90 mL/min (ref >90)
Glucose, Bld: 97 mg/dL (ref 70–99)

## 2012-08-15 SURGERY — LEFT HEART CATHETERIZATION WITH CORONARY ANGIOGRAM
Anesthesia: LOCAL

## 2012-08-15 SURGERY — CANCELLED PROCEDURE
Site: Leg Upper | Laterality: Right

## 2012-08-15 MED ORDER — MIDAZOLAM HCL 2 MG/2ML IJ SOLN
INTRAMUSCULAR | Status: AC
  Filled 2012-08-15: qty 2

## 2012-08-15 MED ORDER — BIVALIRUDIN 250 MG IV SOLR
INTRAVENOUS | Status: AC
  Filled 2012-08-15: qty 250

## 2012-08-15 MED ORDER — LISINOPRIL 2.5 MG PO TABS
2.5000 mg | ORAL_TABLET | Freq: Every day | ORAL | Status: DC
  Administered 2012-08-15 – 2012-08-22 (×6): 2.5 mg via ORAL
  Filled 2012-08-15 (×8): qty 1

## 2012-08-15 MED ORDER — OXYCODONE-ACETAMINOPHEN 5-325 MG PO TABS
1.0000 | ORAL_TABLET | ORAL | Status: DC | PRN
  Administered 2012-08-15: 2 via ORAL
  Filled 2012-08-15: qty 2

## 2012-08-15 MED ORDER — HEPARIN (PORCINE) IN NACL 100-0.45 UNIT/ML-% IJ SOLN
800.0000 [IU]/h | INTRAMUSCULAR | Status: DC
  Administered 2012-08-15: 1000 [IU]/h via INTRAVENOUS
  Administered 2012-08-16 – 2012-08-17 (×2): 1100 [IU]/h via INTRAVENOUS
  Administered 2012-08-18: 1000 [IU]/h via INTRAVENOUS
  Administered 2012-08-18 – 2012-08-20 (×3): 900 [IU]/h via INTRAVENOUS
  Administered 2012-08-22: 800 [IU]/h via INTRAVENOUS
  Administered 2012-08-22: 900 [IU]/h via INTRAVENOUS
  Filled 2012-08-15 (×12): qty 250

## 2012-08-15 MED ORDER — SODIUM CHLORIDE 0.9 % IV SOLN
INTRAVENOUS | Status: DC
  Administered 2012-08-17: 22:00:00 via INTRAVENOUS

## 2012-08-15 MED ORDER — CARVEDILOL 3.125 MG PO TABS
3.1250 mg | ORAL_TABLET | Freq: Two times a day (BID) | ORAL | Status: DC
  Administered 2012-08-15 – 2012-08-22 (×13): 3.125 mg via ORAL
  Filled 2012-08-15 (×16): qty 1

## 2012-08-15 MED ORDER — HEPARIN (PORCINE) IN NACL 2-0.9 UNIT/ML-% IJ SOLN
INTRAMUSCULAR | Status: AC
  Filled 2012-08-15: qty 1000

## 2012-08-15 MED ORDER — MORPHINE SULFATE 2 MG/ML IJ SOLN: 4.0000 mg | Freq: Once | INTRAMUSCULAR | Status: DC

## 2012-08-15 MED ORDER — TICAGRELOR 90 MG PO TABS
90.0000 mg | ORAL_TABLET | Freq: Two times a day (BID) | ORAL | Status: DC
Start: 2012-08-16 — End: 2012-08-22
  Administered 2012-08-16 – 2012-08-22 (×13): 90 mg via ORAL
  Filled 2012-08-15 (×14): qty 1

## 2012-08-15 MED ORDER — AMIODARONE HCL 150 MG/3ML IV SOLN
INTRAVENOUS | Status: AC
  Filled 2012-08-15: qty 6

## 2012-08-15 MED ORDER — FENTANYL CITRATE 0.05 MG/ML IJ SOLN
INTRAMUSCULAR | Status: AC
  Filled 2012-08-15: qty 2

## 2012-08-15 MED ORDER — EPTIFIBATIDE 75 MG/100ML IV SOLN
2.0000 ug/kg/min | INTRAVENOUS | Status: AC
  Administered 2012-08-15: 2 ug/kg/min via INTRAVENOUS
  Filled 2012-08-15 (×2): qty 100

## 2012-08-15 MED ORDER — MORPHINE SULFATE 2 MG/ML IJ SOLN: 2.0000 mg | INTRAMUSCULAR | Status: DC | PRN

## 2012-08-15 MED ORDER — LIDOCAINE HCL (PF) 1 % IJ SOLN
INTRAMUSCULAR | Status: AC
  Filled 2012-08-15: qty 30

## 2012-08-15 MED ORDER — VERAPAMIL HCL 2.5 MG/ML IV SOLN
INTRAVENOUS | Status: AC
  Filled 2012-08-15: qty 2

## 2012-08-15 MED ORDER — ASPIRIN 81 MG PO CHEW
CHEWABLE_TABLET | ORAL | Status: AC
  Filled 2012-08-15: qty 4

## 2012-08-15 MED ORDER — EPTIFIBATIDE BOLUS VIA INFUSION
180.0000 ug/kg | Freq: Once | INTRAVENOUS | Status: DC
  Filled 2012-08-15 (×3): qty 16

## 2012-08-15 MED ORDER — NITROGLYCERIN 0.4 MG SL SUBL: 0.4000 mg | SUBLINGUAL_TABLET | SUBLINGUAL | Status: DC | PRN

## 2012-08-15 MED ORDER — ASPIRIN 81 MG PO CHEW
81.0000 mg | CHEWABLE_TABLET | Freq: Every day | ORAL | Status: DC
  Administered 2012-08-16 – 2012-08-22 (×7): 81 mg via ORAL
  Filled 2012-08-15 (×7): qty 1

## 2012-08-15 MED ORDER — EPTIFIBATIDE 75 MG/100ML IV SOLN
INTRAVENOUS | Status: AC
  Filled 2012-08-15: qty 100

## 2012-08-15 MED ORDER — TICAGRELOR 90 MG PO TABS
ORAL_TABLET | ORAL | Status: AC
  Filled 2012-08-15: qty 2

## 2012-08-15 MED ORDER — NITROGLYCERIN 0.2 MG/ML ON CALL CATH LAB
INTRAVENOUS | Status: AC
  Filled 2012-08-15: qty 1

## 2012-08-15 MED ORDER — SODIUM CHLORIDE 0.9 % IV SOLN: INTRAVENOUS | Status: AC

## 2012-08-15 MED ORDER — ENSURE COMPLETE PO LIQD: 237.0000 mL | Freq: Two times a day (BID) | ORAL | Status: DC | PRN

## 2012-08-15 SURGICAL SUPPLY — 36 items
BANDAGE ESMARK 6X9 LF (GAUZE/BANDAGES/DRESSINGS) IMPLANT
BNDG ESMARK 6X9 LF (GAUZE/BANDAGES/DRESSINGS)
CANISTER SUCTION 2500CC (MISCELLANEOUS) ×2 IMPLANT
CLIP TI MEDIUM 24 (CLIP) ×2 IMPLANT
CLIP TI WIDE RED SMALL 24 (CLIP) ×2 IMPLANT
CLOTH BEACON ORANGE TIMEOUT ST (SAFETY) ×2 IMPLANT
COVER SURGICAL LIGHT HANDLE (MISCELLANEOUS) ×2 IMPLANT
DERMABOND ADVANCED (GAUZE/BANDAGES/DRESSINGS) ×1
DERMABOND ADVANCED .7 DNX12 (GAUZE/BANDAGES/DRESSINGS) ×1 IMPLANT
DRAIN SNY 10X20 3/4 PERF (WOUND CARE) IMPLANT
DRAPE WARM FLUID 44X44 (DRAPE) ×2 IMPLANT
DRSG COVADERM 4X8 (GAUZE/BANDAGES/DRESSINGS) IMPLANT
ELECT REM PT RETURN 9FT ADLT (ELECTROSURGICAL) ×2
ELECTRODE REM PT RTRN 9FT ADLT (ELECTROSURGICAL) ×1 IMPLANT
EVACUATOR SILICONE 100CC (DRAIN) IMPLANT
GLOVE SS BIOGEL STRL SZ 7 (GLOVE) ×1 IMPLANT
GLOVE SUPERSENSE BIOGEL SZ 7 (GLOVE) ×1
GOWN STRL NON-REIN LRG LVL3 (GOWN DISPOSABLE) ×4 IMPLANT
KIT BASIN OR (CUSTOM PROCEDURE TRAY) ×2 IMPLANT
KIT ROOM TURNOVER OR (KITS) ×2 IMPLANT
NS IRRIG 1000ML POUR BTL (IV SOLUTION) ×4 IMPLANT
PACK PERIPHERAL VASCULAR (CUSTOM PROCEDURE TRAY) ×2 IMPLANT
PAD ARMBOARD 7.5X6 YLW CONV (MISCELLANEOUS) ×4 IMPLANT
PADDING CAST COTTON 6X4 STRL (CAST SUPPLIES) IMPLANT
STAPLER VISISTAT 35W (STAPLE) IMPLANT
SUT PROLENE 5 0 C 1 24 (SUTURE) ×2 IMPLANT
SUT PROLENE 6 0 BV (SUTURE) IMPLANT
SUT VIC AB 2-0 CTX 36 (SUTURE) ×2 IMPLANT
SUT VIC AB 3-0 SH 27 (SUTURE) ×1
SUT VIC AB 3-0 SH 27X BRD (SUTURE) ×1 IMPLANT
SYR TB 1ML LUER SLIP (SYRINGE) ×2 IMPLANT
TOWEL OR 17X24 6PK STRL BLUE (TOWEL DISPOSABLE) ×4 IMPLANT
TOWEL OR 17X26 10 PK STRL BLUE (TOWEL DISPOSABLE) ×2 IMPLANT
TRAY FOLEY CATH 14FRSI W/METER (CATHETERS) ×2 IMPLANT
UNDERPAD 30X30 INCONTINENT (UNDERPADS AND DIAPERS) ×2 IMPLANT
WATER STERILE IRR 1000ML POUR (IV SOLUTION) ×2 IMPLANT

## 2012-08-15 NOTE — Progress Notes (Signed)
NUTRITION FOLLOW UP  Intervention:    Ensure Complete twice daily PRN (350 kcals, 13 gm protein per 8 fl oz bottle)  RD to continue to follow  Nutrition Dx:   Inadequate oral intake, resolved  Goal:   Oral intake with meals & supplements to meet >/= 90% of estimated nutrition needs, met  Monitor:   PO & supplemental intake, weight, labs, I/O's  Assessment:   Chart reviewed. CT scan revealed small hematoma surrounding right common femoral artery.  Plan for OR today for right femoral thrombectomy and probable patch angioplasty.  Patient's appetite much improved.  PO intake at 90-100% per flowsheet records.  RD to change supplement frequency to as needed basis.  Height: Ht Readings from Last 1 Encounters:  08/13/12 6\' 1"  (1.854 m)    Weight Status:   Wt Readings from Last 1 Encounters:  08/15/12 142 lb 9.6 oz (64.683 kg)    Re-estimated needs:  Kcal: 1800-2000 Protein: 90-100 gm Fluid: 1.8-2.0 L  Skin: Intact  Diet Order: NPO   Intake/Output Summary (Last 24 hours) at 08/15/12 0847 Last data filed at 08/15/12 0600  Gross per 24 hour  Intake 1293.67 ml  Output   1550 ml  Net -256.33 ml    Last BM: 6/15  Labs:   Recent Labs Lab 08/09/12 0355 08/14/12 1443 08/15/12 0530  NA 134* 133* 134*  K 3.8 4.2 4.0  CL 97 99 100  CO2 29 27 25   BUN 9 12 11   CREATININE 0.90 0.83 0.84  CALCIUM 9.7 9.4 9.3  GLUCOSE 105* 116* 97    Scheduled Meds: . atorvastatin  80 mg Oral q1800  . carvedilol  3.125 mg Oral BID WC  . cefUROXime (ZINACEF)  IV  1.5 g Intravenous On Call to OR  . feeding supplement  237 mL Oral BID BM  . lisinopril  2.5 mg Oral Daily  . pantoprazole  40 mg Oral Daily  . sodium chloride  3 mL Intravenous Q12H    Continuous Infusions: . eptifibatide Stopped (08/15/12 0518)  . heparin 1,000 Units/hr (08/14/12 1405)    Maureen Chatters, RD, LDN Pager #: [phone] After-Hours Pager #: [phone]

## 2012-08-15 NOTE — Progress Notes (Signed)
Edward Son, CRNA give pt a total of 3 SL NTG split Q 3 minutes apart starting at 10:23. Patient given 2 mg Morphine followed by an additional 2 mg 10:23 and 10:25

## 2012-08-15 NOTE — Progress Notes (Signed)
ANTICOAGULATION CONSULT NOTE - Follow Up Consult  Pharmacy Consult for Lovenox + Coumadin>>>Integrilin + Heparin Indication: LV thrombus, new femoral thrombus  No Known Allergies  Labs:  Recent Labs  08/13/12 0149  08/14/12 0510 08/14/12 1443 08/15/12 0530  HGB 12.5*  --  12.6*  --  11.8*  HCT 36.4*  --  36.4*  --  34.3*  PLT 167  --  180  --  207  LABPROT 15.7*  --  13.7  --  13.3  INR 1.28  --  1.06  --  1.02  HEPARINUNFRC 0.25*  < > 0.61 0.40 0.47  CREATININE  --   --   --  0.83 0.84  < > = values in this interval not displayed.  Estimated Creatinine Clearance: 90.9 ml/min (by C-G formula based on Cr of 0.84).  Assessment: 57 year old male on heparin/integrilin for LV thrombus, new femoral thrombus and recent LAD stent. Anticipated vascular surgery today. Heparin level now at goal at 0.47-heparin to be turned off today for surgery later.   Goal Heparin level goal = 0.3-0.5 while on integrilin  Plan:  Continue heparin at 1000 units/hr until dced for surgery.  Follow up resume heparin/coumadin and brilinta after surgery.   Wendie Simmer, PharmD, BCPS Clinical Pharmacist  Pager: [phone]

## 2012-08-15 NOTE — Progress Notes (Signed)
ANTICOAGULATION CONSULT NOTE - Follow Up Consult  Pharmacy Consult for Integrilin + Heparin Indication: LV thrombus, new femoral thrombus  No Known Allergies  Labs:  Recent Labs  08/13/12 0149  08/14/12 0510 08/14/12 1443 08/15/12 0530  HGB 12.5*  --  12.6*  --  11.8*  HCT 36.4*  --  36.4*  --  34.3*  PLT 167  --  180  --  207  LABPROT 15.7*  --  13.7  --  13.3  INR 1.28  --  1.06  --  1.02  HEPARINUNFRC 0.25*  < > 0.61 0.40 0.47  CREATININE  --   --   --  0.83 0.84  < > = values in this interval not displayed.  Estimated Creatinine Clearance: 90.9 ml/min (by C-G formula based on Cr of 0.84).  Assessment: 57 year old male previously on heparin/integrilin for LV thrombus, new femoral thrombus and recent LAD stent. Pt clotted stent this AM when integrilin was dc'd for vascular surgery. To restart heparin 6 hours post-sheath removal to continue treatment of apical thrombus. He is also continuing on integrilin x 18 hours post-cath.    Goal Heparin level goal = 0.3-0.5 while on integrilin  Plan:  1. Restart heparin at 1730 today at 1000 units/hr 2. Check an 8 hour heparin level 3. Daily heparin level and CBC 4. F/u plans for oral anticoagulation  Lysle Pearl, PharmD, BCPS Pager # [phone] 08/15/2012 4:42 PM

## 2012-08-15 NOTE — CV Procedure (Signed)
Cardiac Catheterization Operative Report  Edward Cooper [id number] 6/18/201411:31 AM No primary provider on file.  Procedure Performed:  1. Left Heart Catheterization 2. Selective Coronary Angiography 3. Left ventricular pressures 4. PTCA with balloon angioplasty of the proximal LAD stented segment 5. Thrombectomy of the occluded proximal LAD in the previously stented segment.  6. Distal aortogram with visualization of bilateral iliac arteries and bilateral common femoral arteries.   Operator: Verne Carrow, MD  Arterial access site:  Right radial artery.   Indication:   57 yo male with history of recent acute anterior MI with placement of 2 overlapping DES in the ostium/proximal LAD. Also noted to have LV thrombus and cath complications including right common femoral artery thrombus. His Brilinta was held last week to allow washout for right common femoral artery thrombectomy today. He has been on Integrilin over the last week. Integrilin stopped this am before he went to the OR and he developed chest pain around 10:20am. EKG consistent with acute ST elevation anterior MI.                     Procedure Details: Emergency consent obtained.  The patient was brought to the cath lab from the OR holding area. The patient was further sedated with Versed and Fentanyl. The right wrist was assessed with an Allens test which was positive. The right wrist was prepped and draped in a sterile fashion. 1% lidocaine was used for local anesthesia. Using the modified Seldinger access technique, a hybrid 5/6 French sheath was placed in the right radial artery. 3 mg Verapamil was given through the sheath. He was given a bolus of Angiomax and a drip was started. He was given a double bolus of Integrilin in the cath lab and a drip was started. He was given 180 mg Brilinta po x 1. A JR4 catheter was used to engage and visualize the RCA. I then engaged the left main with a XB LAD 3.5 guiding catheter.  Angiography demonstrated complete occlusion of the ostial LAD in the stent.   PCI Note: Medications as above. ACT over 200. BMW wire down LAD. 2.5 x 15 mm balloon x 2 in ostial/proximal. LAD. Flow re-established down LAD. I then used a Priority thrombectomy catheter to perform aspiration thrombectomy. 2 runs with this device. I then used a 3.25 x 15 mm [place] balloon x 2 to dilate the stented segment. There was an excellent angiographic result with excellent flow down the vessel. The procedure was complicated by 2 episodes of ventricular fibrillation requiring defibrillation occurring immediately after flow was re-established. Amiodarone 300 mg IV given x 1. I had planned on using an IVUS catheter to confirm stent expansion but none of the available IVUS machines could be started. After a long delay in trying to start an IVUS machine, we elected to stop the procedure.    A pigtail catheter was used to measure LV pressures.  I then performed a distal aortogram and right common femoral artery angiogram. The sheath was removed from the right radial artery and a Terumo hemostasis band was applied at the arteriotomy site on the right wrist.    There were no immediate complications. The patient was taken to the recovery area in stable condition.   Hemodynamic Findings: Central aortic pressure: 118/78 Left ventricular pressure: 112/12/19  Angiographic Findings:  Left main: No obstructive disease.   Left Anterior Descending Artery: Large vessel that courses to the apex. 100% ostial occlusion in the previously placed stent.  Circumflex Artery: Moderate to large caliber vessel with moderate caliber obtuse marginal branch. No obstructive disease.   Right Coronary Artery: Moderate to large caliber dominant vessel with diffuse 50% mid vessel stenosis.   Left Ventricular Angiogram: Deferred.   Distal Aortogram: No aneurysm or stenosis distal aorta. Iliac arteries are patent. Right common femoral artery is  patent with no obvious filling defects.   Impression: 1. Acute anterior STEMI secondary to occlusion of the stented segment of the ostial LAD 2. Successful PTCA/thrombectomy of the ostial LAD stented segment 3. Ventricular fibrillation arrest during the procedure  Recommendations: He will need dual anti-platelet therapy with ASA and Brilinta. Will continue Integrilin drip for 18 hours. Angiomax stopped in cath lab.        Complications:  None. The patient tolerated the procedure well.

## 2012-08-15 NOTE — Progress Notes (Signed)
Patient Name: Edward Cooper Date of Encounter: 08/15/2012    Principal Problem:   ST elevation myocardial infarction (STEMI) of anterior wall, initial episode of care Active Problems:   Hypertension    Tobacco abuse   Ventricular tachycardia   LV (left ventricular) mural thrombus with acute MI   CAD (coronary artery disease)   Cardiomyopathy, ischemic   Groin hematoma   Non-occlusive thrombus    SUBJECTIVE Denies any chest pain or SOB. Right groin without significant discomfort. No change. For OR today.  CURRENT MEDS . atorvastatin  80 mg Oral q1800  . carvedilol  6.25 mg Oral BID WC  . cefUROXime (ZINACEF)  IV  1.5 g Intravenous On Call to OR  . feeding supplement  237 mL Oral BID BM  . lisinopril  5 mg Oral Daily  . pantoprazole  40 mg Oral Daily  . sodium chloride  3 mL Intravenous Q12H    OBJECTIVE  Filed Vitals:   08/14/12 1300 08/14/12 1726 08/14/12 2100 08/15/12 0519  BP: 101/62 103/58 116/68 106/61  Pulse: 71 72 72   Temp: 98.5 F (36.9 C)  98.4 F (36.9 C) 98.1 F (36.7 C)  TempSrc: Oral  Oral Oral  Resp: 18     Height:      Weight:    142 lb 9.6 oz (64.683 kg)  SpO2: 100%  100% 99%    Intake/Output Summary (Last 24 hours) at 08/15/12 0744 Last data filed at 08/15/12 0600  Gross per 24 hour  Intake 1533.67 ml  Output   1550 ml  Net -16.33 ml   Filed Weights   08/13/12 1704 08/14/12 0359 08/15/12 0519  Weight: 152 lb 12.5 oz (69.3 kg) 152 lb 5.4 oz (69.1 kg) 142 lb 9.6 oz (64.683 kg)    PHYSICAL EXAM  General: Pleasant, NAD. Neuro: Alert and oriented X 3. Moves all extremities spontaneously. Psych: Normal affect. HEENT:  Normal  Neck: Supple without bruits or JVD. Lungs:  Resp regular and unlabored, CTA. Heart: RRR no s3, s4, or murmurs. Abdomen: Soft, non-tender, non-distended, BS + x 4.  Extremities: No clubbing, cyanosis or edema. DP/PT ?non palp. R fem cath site with ecchymosis and small hematoma. No bleeding.   DP nonpalp on right,  1+ on left.  Accessory Clinical Findings  CBC  Recent Labs  08/14/12 0510 08/15/12 0530  WBC 6.0 4.8  HGB 12.6* 11.8*  HCT 36.4* 34.3*  MCV 88.3 87.5  PLT 180 207   TELE  Nsr, 71 bpm  Radiology/Studies  Lower Extremity Duplex  09-08-2012: ARTERIAL Duplex imaging reveals thrombus noted at the distal common femoral at the bifurcation. Minimal to no flow noted in the profunda or superficial femoral arteries until distal femoral. Waveforms in distal femoral, popliteal, posterior tibial and anterior tibial arteries are monophasic. Left CFA is triphasic.   Ct Pelvis W Contrast  08-Sep-2012   *RADIOLOGY REPORT*  Clinical Data:  Post cardiac cath with expanding the right groin hematoma.  CT PELVIS WITH CONTRAST    IMPRESSION: Small fluid collection around the right common femoral artery, measuring 2.2 cm, is consistent with a small hematoma.  Punctate densities along the anterior of the hematoma could represent small areas of bleeding but there is no evidence for a large pseudoaneurysm.  There is focal thrombus within the distal common femoral artery and involving the proximal superficial femoral artery.  These results were called by telephone on 2012/09/08 at 07:05 a.m. to Dr. Brion Aliment, who verbally acknowledged these results.  Original Report Authenticated By: Richarda Overlie, M.D.   ASSESSMENT AND PLAN  1. Acute anterior STEMI/CAD: s/p PCI/DES of the LAD on 6/10. No chest pain.  Brilinta, coumadin and LMWH stopped 6/15. Continue  Heparin infusions.  Integrelin on hold for OR today. Cont statin, bb.  He is not on asa, as this was d/c'd in the setting of brilinta and coumadin. Would reload with Brilinta 180 mg po post op when ok with vascular surgery.   2. ICM: EF 35-40%. Euvolemic on exam. Continue bb and acei. ACEi was held yesterday due to low BP- will reduce dose.  3. R groin hematoma with evidence of femoral thrombus. On bedrest.  Plan for vasc surgery today after brilinta wash out.  Cont  heparin.  4. LV thrombus:  Cont heparin.  Will need to be bridged back to coumadin following vasc surgery.  5. Tobacco Abuse: cessation advised.   6. NSVT: resolved. Cont bb  7. HTN: stable  [place] Royal [facility] 08/15/2012 7:44 AM

## 2012-08-15 NOTE — Progress Notes (Signed)
Pt. Arrived to OR Holding Area c/o chest pain.  Vitals signs obtained, Stat EKG done and Dr. Jean Rosenthal notified.  Code STEMI called.  IV started 18G inserted right hand.  Dr. [place] in to see pt. And instructed to proceed to cath lab.  Transported to cath lab via stretcher with O2 at 10 liters face mask.

## 2012-08-15 NOTE — Progress Notes (Signed)
pts BP 90/60 manually, HR NSR 72; pt asymptomatic, Dr.Jordan made aware, new orders received; will continue to monitor

## 2012-08-15 NOTE — Progress Notes (Signed)
Patient ID: Edward Cooper, male   DOB: [birth year], 57 y.o.   MRN: [id number] Vascular Surgery Progress Note  Subjective: Rethrombosis of LAD stent today. Patient was in route to the OR for surgery right lower extremity when developed severe chest pain and diaphoresis. Taken to Cath Lab and thrombosis of LAD stent had occurred. Able to get reopened by Dr. Camillo Flaming. Obtain angiogram right lower extremity during procedure which did not reveal significant filling defect in right common femoral artery with rapid flow in this area Objective:  Filed Vitals:   08/15/12 1330  BP: 117/77  Pulse: 116  Temp:   Resp: 22    Right lower extremity stable with no palpable pulse. Adequately perfused. Denies pain or numbness in the right foot.  Patient is alert and oriented x3 in no apparent distress at this point-no diaphoresis   Labs:  Recent Labs Lab 08/09/12 0355 08/14/12 1443 08/15/12 0530  CREATININE 0.90 0.83 0.84    Recent Labs Lab 08/09/12 0355 08/14/12 1443 08/15/12 0530  NA 134* 133* 134*  K 3.8 4.2 4.0  CL 97 99 100  CO2 29 27 25   BUN 9 12 11   CREATININE 0.90 0.83 0.84  GLUCOSE 105* 116* 97  CALCIUM 9.7 9.4 9.3    Recent Labs Lab 08/13/12 0149 08/14/12 0510 08/15/12 0530  WBC 5.8 6.0 4.8  HGB 12.5* 12.6* 11.8*  HCT 36.4* 36.4* 34.3*  PLT 167 180 207    Recent Labs Lab 08/13/12 0149 08/14/12 0510 08/15/12 0530  INR 1.28 1.06 1.02    I/O last 3 completed shifts: In: 1533.7 [P.O.:480; I.V.:1053.7] Out: 2850 [Urine:2850]  Imaging: No results found.  Assessment/Plan:    LOS: 8 days  s/p Procedure(s): LEFT HEART CATHETERIZATION WITH CORONARY ANGIOGRAM  Patient's right lower extremity in stable. Plan is to wait 4-6 weeks before addressing this unless he develops ischemia. Clearly he is very dependent on either Brilinta or Integrilin. We'll check ABIs and schedule patient to see me in office in 4-6 weeks with duplex scan of right femoral artery   Josephina Gip, MD 08/15/2012 1:45 PM

## 2012-08-16 ENCOUNTER — Telehealth: Payer: Self-pay | Admitting: Vascular Surgery

## 2012-08-16 ENCOUNTER — Other Ambulatory Visit: Payer: Self-pay | Admitting: *Deleted

## 2012-08-16 DIAGNOSIS — I739 Peripheral vascular disease, unspecified: Secondary | ICD-10-CM

## 2012-08-16 DIAGNOSIS — I743 Embolism and thrombosis of arteries of the lower extremities: Secondary | ICD-10-CM

## 2012-08-16 DIAGNOSIS — Z48812 Encounter for surgical aftercare following surgery on the circulatory system: Secondary | ICD-10-CM

## 2012-08-16 LAB — BASIC METABOLIC PANEL
BUN: 12 mg/dL (ref 6–23)
Chloride: 103 mEq/L (ref 96–112)
GFR calc Af Amer: >90 mL/min (ref >90)
Glucose, Bld: 90 mg/dL (ref 70–99)
Potassium: 4.1 mEq/L (ref 3.5–5.1)

## 2012-08-16 LAB — CBC
HCT: 34.8 % — ABNORMAL LOW (ref 39.0–52.0)
Hemoglobin: 11.8 g/dL — ABNORMAL LOW (ref 13.0–17.0)
WBC: 6.5 10*3/uL (ref 4.0–10.5)

## 2012-08-16 LAB — PROTIME-INR: INR: 1.01 (ref 0.00–1.49)

## 2012-08-16 MED ORDER — WARFARIN - PHARMACIST DOSING INPATIENT
Freq: Every day | Status: DC
  Administered 2012-08-17 – 2012-08-19 (×3)

## 2012-08-16 MED ORDER — WARFARIN SODIUM 7.5 MG PO TABS
7.5000 mg | ORAL_TABLET | Freq: Once | ORAL | Status: AC
  Administered 2012-08-16: 7.5 mg via ORAL
  Filled 2012-08-16: qty 1

## 2012-08-16 MED FILL — Sodium Chloride IV Soln 0.9%: INTRAVENOUS | Qty: 50 | Status: AC

## 2012-08-16 NOTE — Progress Notes (Signed)
CARDIAC REHAB PHASE I   PRE:  Rate/Rhythm: 64 SR    BP: sitting 87/35, standing 104/60    SaO2:   MODE:  Ambulation: 350 ft   POST:  Rate/Rhythm: 85 SR    BP: sitting 120/68     SaO2:   Tolerated well with quick pace. Denied CP or SOB. BP low initially in bed but increased with activity. Denied dizziness. Pt anxious to d/c. Return to bed. Will f/u. 1035-1100   Elissa Lovett [place] CES, ACSM 08/16/2012 10:57 AM

## 2012-08-16 NOTE — Telephone Encounter (Addendum)
Message copied by Rosalyn Charters on Thu Aug 16, 2012 11:13 AM ------      Message from: Lorin Mercy K      Created: Thu Aug 16, 2012 10:44 AM      Regarding: schedule                   ----- Message -----         From: Dara Lords, PA-C         Sent: 08/16/2012   9:34 AM           To: Sharee Pimple, CMA            This pt needs to fu with Dr. Hart Rochester in 5 weeks with ABI's and a duplex of right femoral artery.            Thanks,      Lelon Mast ------  notified patient of fu appt. on 09-18-12 2:30 with dr. Hart Rochester

## 2012-08-16 NOTE — Progress Notes (Signed)
Patient ID: Edward Cooper, male   DOB: August 05, 1955, 57 y.o.   MRN: [id number] Vascular Surgery Progress Note  Subjective: Patient states right foot is not causing pain or numbness. Like to be discharged soon as possible. Denies chest pain this a.m.  Objective:  Filed Vitals:   08/16/12 0743  BP: 87/69  Pulse: 60  Temp: 98.5 F (36.9 C)  Resp: 11    General alert and oriented x3 in no apparent distress Right inguinal area and changed with 2 centimeter hematoma 3+ femoral pulse No palpable pulses right foot. Motion and sensation right foot intact   Labs:  Recent Labs Lab 08/14/12 1443 08/15/12 0530 08/16/12 0410  CREATININE 0.83 0.84 0.85    Recent Labs Lab 08/14/12 1443 08/15/12 0530 08/16/12 0410  NA 133* 134* 136  K 4.2 4.0 4.1  CL 99 100 103  CO2 27 25 25   BUN 12 11 12   CREATININE 0.83 0.84 0.85  GLUCOSE 116* 97 90  CALCIUM 9.4 9.3 9.2    Recent Labs Lab 08/15/12 0530 08/15/12 2200 08/16/12 0410  WBC 4.8 7.3 6.5  HGB 11.8* 11.7* 11.8*  HCT 34.3* 33.6* 34.8*  PLT 207 226 216    Recent Labs Lab 08/14/12 0510 08/15/12 0530 08/16/12 0410  INR 1.06 1.02 1.01    I/O last 3 completed shifts: In: 2151.2 [P.O.:840; I.V.:1311.2] Out: 3525 [Urine:3525]  Imaging: No results found.  Assessment/Plan:   LOS: 9 days  s/p Procedure(s): LEFT HEART CATHETERIZATION WITH CORONARY ANGIOGRAM  Will not plan any procedure right lower extremity for at least 4-6 weeks. Discussed this with patient. I will see him in office in 4-6 weeks with duplex scan of right femoral artery and ABIs. ABIs ordered yesterday and are currently pending   Josephina Gip, MD 08/16/2012 7:52 AM

## 2012-08-16 NOTE — Progress Notes (Signed)
VASCULAR LAB PRELIMINARY  ARTERIAL  ABI completed:    RIGHT    LEFT    PRESSURE WAVEFORM  PRESSURE WAVEFORM  BRACHIAL 105 Triphasic BRACHIAL  Unable to insonate due to IV placement.  DP 41 Dampened monophasic. DP    AT   AT    PT  Unable to insonate. PT    PER   PER    GREAT TOE  NA GREAT TOE  NA    RIGHT LEFT  ABI 0.39 Not evaluated.   This is a repeat ABI. When compared to the previous study on 08/12/2012, the right ABI is reduced from 0.51 to 0.39, however the previous ABI was calculated using the posterior tibial artery which was not insonated today. Being that the right dorsalis pedis artery was not insonated during prior evaluation, unable to determine if there is a significant change in ABI of the right lower extremity.   08/16/2012 2:46 PM Gertie Fey, RVT, RDCS, RDMS

## 2012-08-16 NOTE — Progress Notes (Signed)
ANTICOAGULATION CONSULT NOTE - Follow Up Consult  Pharmacy Consult for Integrilin + Heparin > warfarin Indication: LV thrombus, new femoral thrombus  No Known Allergies  Labs:  Recent Labs  08/14/12 0510 08/14/12 1443 08/15/12 0530 08/15/12 2200 08/16/12 0410  HGB 12.6*  --  11.8* 11.7* 11.8*  HCT 36.4*  --  34.3* 33.6* 34.8*  PLT 180  --  207 226 216  LABPROT 13.7  --  13.3  --  13.2  INR 1.06  --  1.02  --  1.01  HEPARINUNFRC 0.61 0.40 0.47  --  0.34  CREATININE  --  0.83 0.84  --  0.85    Estimated Creatinine Clearance: 94.2 ml/min (by C-G formula based on Cr of 0.85).  Assessment: 57 year old male admitted 08/07/2012 with STEMI with LAD stent, developed RCFA clot, and subsequent LAD stent thrombosis.  Pharmacy consulted to resume heparin and now warfarin for new LV thrombus.  Coag: LV Thrombus, heparin + warfarin; Heparin level at goal, CBC stable, integrilin off at 5am, baseline INR wnl.   CV:  CAD: ASA, atorva, lisinopril, ticagrelor: BP and HR wnl   Goal Heparin level goal = 0.3-0.7, INR 2-3  Plan:  1. Increase heparin to 1100 units/hr 2. Daily heparin level and CBC 3. Warfarin 7.5 mg PO x 1 today, daily INR.  Thank you for allowing pharmacy to be a part of this patients care team.  Lovenia Kim Pharm.D., BCPS Clinical Pharmacist 08/16/2012 1:24 PM Pager: [phone] Phone: [phone]

## 2012-08-16 NOTE — Progress Notes (Addendum)
Cardiology Clinic Note  Name:  Edward Cooper   DOB:  1955-08-26   MRN:  [id number] Date of Encounter: 08/16/2012, 8:34 AM Primary Care Provider:  No primary provider on file. Primary Cardiologist:  Dr. [place]   HPI: Patient is not complaining of any chest pain, sob, n/v, diaphoresis or palpitations. Has noted intermittent paresthesias over right greater trochanter. No numbness/tingling or pain in right LE. He is upset with what happened and wants to go home.  Problem List Past Medical History  Diagnosis Date  . Tobacco use   . Myocardial infarction   . Coronary artery disease   . GERD (gastroesophageal reflux disease)   . Seizures   . Alcoholism    Past Surgical History  Procedure Laterality Date  . None    . Tonsillectomy      Most Recent Cardiac Studies: 2D ECHO 08/08/12 Left ventricle: Akinesis of the anterior wall and the anterior septum. Apex is akinetic or dyskinetic. Findings are most consistent with apical clot. The cavity size was normal. Wall thickness was increased in a pattern of mild LVH. Systolic function was moderately reduced. The estimated ejection fraction was in the range of 35% to 40%. Transthoracic echocardiography. M-mode, complete 2D, spectral Doppler, and color Doppler. Height: Height: 185.4cm. Height: 73in. Weight: Weight: 66.8kg. Weight: 147lb. Body mass index: BMI: 19.4kg/m^2. Body surface area: BSA: 1.25m^2. Blood pressure: 135/68. Patient status: Inpatient. Location: Bedside.     Allergies No Known Allergies  Home Medications Current Facility-Administered Medications  Medication Dose Route Frequency Provider Last Rate Last Dose  . 0.9 %  sodium chloride infusion  250 mL Intravenous PRN Joline Salt Barrett, PA-C 10 mL/hr at 08/14/12 1405 250 mL at 08/14/12 1405  . 0.9 %  sodium chloride infusion   Intravenous Continuous Kathleene Hazel, MD 10 mL/hr at 08/15/12 1730    . acetaminophen (TYLENOL) tablet 650 mg  650 mg Oral Q4H PRN Joline Salt  Barrett, PA-C      . ALPRAZolam Prudy Feeler) tablet 0.25 mg  0.25 mg Oral BID PRN Joline Salt Barrett, PA-C      . aspirin chewable tablet 81 mg  81 mg Oral Daily Kathleene Hazel, MD      . atorvastatin (LIPITOR) tablet 80 mg  80 mg Oral q1800 Rhonda G Barrett, PA-C   80 mg at 08/15/12 1725  . carvedilol (COREG) tablet 3.125 mg  3.125 mg Oral BID WC Soua Caltagirone M [place], MD   3.125 mg at 08/15/12 1726  . eptifibatide (INTEGRILIN) 0.75 mg/mL bolus via infusion 11,600 mcg  180 mcg/kg Intravenous Once Blakely Gluth M [place], MD      . feeding supplement (ENSURE COMPLETE) liquid 237 mL  237 mL Oral BID PRN Ailene Ards, RD      . heparin ADULT infusion 100 units/mL (25000 units/250 mL)  1,000 Units/hr Intravenous Continuous Drake Leach Rumbarger, RPH 10 mL/hr at 08/15/12 1727 1,000 Units/hr at 08/15/12 1727  . lisinopril (PRINIVIL,ZESTRIL) tablet 2.5 mg  2.5 mg Oral Daily Oniel Meleski M [place], MD   2.5 mg at 08/15/12 1300  . magnesium hydroxide (MILK OF MAGNESIA) suspension 30 mL  30 mL Oral Daily PRN Makinsey Pepitone M [place], MD   30 mL at 08/11/12 1007  . morphine 2 MG/ML injection 2 mg  2 mg Intravenous Q1H PRN Kathleene Hazel, MD      . morphine 2 MG/ML injection 4 mg  4 mg Intravenous Once E. Jairo Ben, MD      . nitroGLYCERIN (NITROSTAT) SL  tablet 0.4 mg  0.4 mg Sublingual Q5 Min x 3 PRN Joline Salt Barrett, PA-C   0.4 mg at 08/15/12 1042  . ondansetron (ZOFRAN) injection 4 mg  4 mg Intravenous Q6H PRN Rhonda G Barrett, PA-C      . oxyCODONE-acetaminophen (PERCOCET/ROXICET) 5-325 MG per tablet 1-2 tablet  1-2 tablet Oral Q4H PRN Kathleene Hazel, MD   2 tablet at 08/15/12 1300  . pantoprazole (PROTONIX) EC tablet 40 mg  40 mg Oral Daily Harry Shuck M [place], MD   40 mg at 08/15/12 1300  . sodium chloride 0.9 % injection 3 mL  3 mL Intravenous Q12H Rhonda G Barrett, PA-C   3 mL at 08/15/12 2151  . sodium chloride 0.9 % injection 3 mL  3 mL Intravenous PRN Rhonda G Barrett, PA-C      . Ticagrelor (BRILINTA)  tablet 90 mg  90 mg Oral BID Kathleene Hazel, MD      . zolpidem [facility]) tablet 5 mg  5 mg Oral QHS PRN Darrol Jump, PA-C        History   Social History  . Marital Status: Divorced    Spouse Name: N/A    Number of Children: N/A  . Years of Education: N/A   Occupational History  . Unemployed    Social History Main Topics  . Smoking status: Current Every Day Smoker -- 0.50 packs/day for 40 years  . Smokeless tobacco: Not on file  . Alcohol Use: 3.6 oz/week    6 Cans of beer per week  . Drug Use: No  . Sexually Active: Not on file   Other Topics Concern  . Not on file   Social History Narrative   Patient lives alone in a trailer. There is no family history of premature coronary artery disease in either parent or any other siblings.     History reviewed. No pertinent family history.  Review of Systems:  General: negative for chills, fever, night sweats or weight changes Cardiovascular: see above Dermatological: negative for new rash Respiratory: negative for cough or wheezing Urologic: negative for hematuria Abdominal: negative for nausea, vomiting, diarrhea, bright red blood per rectum, melena, or hematemesis Neurologic: negative for visual changes, syncope, or dizziness All other systems reviewed and are otherwise negative except as noted above.  Physical Exam: Blood pressure 87/69, pulse 60, temperature 98.5 F (36.9 C), temperature source Oral, resp. rate 11, height 6\' 1"  (1.854 m), weight 149 lb 7.6 oz (67.8 kg), SpO2 98.00%. Wt Readings from Last 3 Encounters:  08/16/12 149 lb 7.6 oz (67.8 kg)  08/16/12 149 lb 7.6 oz (67.8 kg)  08/16/12 149 lb 7.6 oz (67.8 kg)    General: Well developed, well nourished, in no acute distress. Head: Normocephalic, atraumatic, sclera non-icteric, no xanthomas, nares are without discharge.  Neck: Negative for carotid bruits. JVD not elevated. Lungs: Clear bilaterally to auscultation without wheezes, rales, or rhonchi.  Breathing is unlabored. Heart: RRR with S1 S2. No murmurs, rubs, or gallops appreciated. Abdomen: Soft, non-tender, non-distended with normoactive bowel sounds. No hepatomegaly. No rebound/guarding. No obvious abdominal masses. Msk:  Strength and tone appear normal for age. Extremities: No clubbing or cyanosis. No edema.  Distal pedal pulses are non palp on right and 2+ on left. R fem cath site, small non tender hematoma Neuro: Alert and oriented X 3. No facial asymmetry. No focal deficit. Moves all extremities spontaneously. Psych:  Responds to questions appropriately with a normal affect.   Accessory Clinical Findings:  EKG - nsr, 63 bpm, t wave abnormality anterolateral leads   Assessment & Plan:  1. Acute anterior STEMI/CAD: s/p PCI/DES of the LAD on 6/10, and second acute anterior STEMI, secondary to occlusion of the stented segmented following cessation of integrelin prior to scheduled vascular surgery 08/15/12 for right femoral thrombectomy, successful PTCA/thrombectomy of the LAD performed. Was on integrilin x 18 hours after procedure. On Heparin infusion, was loaded with 180 mg of Brilinta in cath lab, to resume asa today. Cont statin, bb. Plan to stay in unit today and ambulate with cardiac rehab. Will transfer to the floor tomorrow.  2. ICM: EF 35-40%. Euvolemic on exam. Continue bb and acei. Acei dose was reduced yesterday due to low BP. BP still soft.   3. R groin hematoma. Distal aortogram showed patent iliac and right common femoral arteries with no obvious filling defects.  Vascular surgery plan to wait 4-6 week before addressing if necessary unless he develops ischemia.  ABI's pending.  4. LV Thrombus: continue heparin. Will bridge back to coumadin.  5. Tobacco Abuse: cessation advised  5. NSVT: resolved. Continue bb  6. HTN: stable.   Darliss Cheney, PA-S Ronie Spies, PA-C 08/16/2012, 8:34 AM  Patient seen and examined and history reviewed. Agree with above findings  and plan. Patient doing well post repeat PCI for LAD reocclusion yesterday. No recurrent angina. I reviewed prior Echo. He does have a definite LV thrombus and needs anticoagulation with Heparin>>coumadin. Continue dual antiplatelet therapy with ASA and brilinta for one month then stop ASA. Adjust beta blocker and ACEi as BP allows. Given normal femoral angiogram yesterday I don't think he'll require any therapy for his leg other than medication.  Theron Arista [facility] 08/16/2012 12:57 PM

## 2012-08-17 LAB — CBC
HCT: 34.2 % — ABNORMAL LOW (ref 39.0–52.0)
Hemoglobin: 11.7 g/dL — ABNORMAL LOW (ref 13.0–17.0)
RDW: 12.4 % (ref 11.5–15.5)
WBC: 8 10*3/uL (ref 4.0–10.5)

## 2012-08-17 LAB — PROTIME-INR: INR: 0.95 (ref 0.00–1.49)

## 2012-08-17 MED ORDER — WARFARIN SODIUM 7.5 MG PO TABS
7.5000 mg | ORAL_TABLET | Freq: Once | ORAL | Status: AC
  Administered 2012-08-17: 7.5 mg via ORAL
  Filled 2012-08-17: qty 1

## 2012-08-17 NOTE — Progress Notes (Signed)
CARDIAC REHAB PHASE I   PRE:  Rate/Rhythm: 69 SR    BP: sitting 103/71    SaO2:   MODE:  Ambulation: 700 ft   POST:  Rate/Rhythm: 84 SR    BP: sitting 104/74     SaO2:   Tolerated well with fast pace. No c/o, feels good. Can walk independently. Reviewed diet and began discussing ex however pt sts he is going to have to ride his bicycle to get places. Encouraged pt to take it easy for several weeks after d/c. Will continue to discuss. [id number]   Elissa Lovett [place] CES, ACSM 08/17/2012 12:37 PM

## 2012-08-17 NOTE — Progress Notes (Signed)
Paged Dr. Terressa Koyanagi for BP of 82/56 (63). No new orders. Will continue to monitor.

## 2012-08-17 NOTE — Progress Notes (Addendum)
ANTICOAGULATION CONSULT NOTE - Follow Up Consult  Pharmacy Consult for Heparin > warfarin Indication: LV thrombus, new femoral thrombus  No Known Allergies  Labs:  Recent Labs  08/14/12 1443  08/15/12 0530 08/15/12 2200 08/16/12 0410 08/17/12 0440  HGB  --   < > 11.8* 11.7* 11.8* 11.7*  HCT  --   < > 34.3* 33.6* 34.8* 34.2*  PLT  --   < > 207 226 216 237  LABPROT  --   --  13.3  --  13.2 12.6  INR  --   --  1.02  --  1.01 0.95  HEPARINUNFRC 0.40  --  0.47  --  0.34 0.57  CREATININE 0.83  --  0.84  --  0.85  --   < > = values in this interval not displayed.  Estimated Creatinine Clearance: 94.4 ml/min (by C-G formula based on Cr of 0.85).  Assessment: 57 year old male admitted 08/07/2012 with STEMI with LAD stent, developed RCFA clot, and subsequent LAD stent thrombosis.  Pharmacy consulted to resume heparin and warfarin for new LV thrombus.  Heparin level at goal, CBC stable.  INR essentially unchanged after 1 dose of Coumadin.  Groin hematoma noted per MD notes, per RN looks stable today.   Goal Heparin level goal = 0.3-0.7, INR 2-3  Plan:  1. Increase heparin to 1100 units/hr. 2. Repeat warfarin 7.5 mg PO x 1 today, daily INR. 3. Daily INR, CBC, and heparin level.  Thank you for allowing pharmacy to be a part of this patients care team.  Tad Moore, BCPS  Clinical Pharmacist Pager [phone]  08/17/2012 10:37 AM

## 2012-08-17 NOTE — Progress Notes (Addendum)
Cardiology Clinic Note  Name:  Edward Cooper   DOB:  09-Jan-1956   MRN:  [id number] Date of Encounter: 08/17/2012, 2:48 PM Primary Care Provider:  No primary provider on file. Primary Cardiologist:  Dr. [place]   HPI: Patient is not complaining of any chest pain, sob, n/v, diaphoresis or palpitations. Anxious to go home.  Problem List Past Medical History  Diagnosis Date  . Tobacco use   . Myocardial infarction   . Coronary artery disease   . GERD (gastroesophageal reflux disease)   . Seizures   . Alcoholism    Past Surgical History  Procedure Laterality Date  . None    . Tonsillectomy      Most Recent Cardiac Studies: 2D ECHO 08/08/12 Left ventricle: Akinesis of the anterior wall and the anterior septum. Apex is akinetic or dyskinetic. Findings are most consistent with apical clot. The cavity size was normal. Wall thickness was increased in a pattern of mild LVH. Systolic function was moderately reduced. The estimated ejection fraction was in the range of 35% to 40%. Transthoracic echocardiography. M-mode, complete 2D, spectral Doppler, and color Doppler. Height: Height: 185.4cm. Height: 73in. Weight: Weight: 66.8kg. Weight: 147lb. Body mass index: BMI: 19.4kg/m^2. Body surface area: BSA: 1.110m^2. Blood pressure: 135/68. Patient status: Inpatient. Location: Bedside.     Allergies No Known Allergies  Home Medications Current Facility-Administered Medications  Medication Dose Route Frequency Provider Last Rate Last Dose  . 0.9 %  sodium chloride infusion  250 mL Intravenous PRN Joline Salt Barrett, PA-C 10 mL/hr at 08/14/12 1405 250 mL at 08/14/12 1405  . 0.9 %  sodium chloride infusion   Intravenous Continuous Kathleene Hazel, MD 10 mL/hr at 08/17/12 0700    . acetaminophen (TYLENOL) tablet 650 mg  650 mg Oral Q4H PRN Rhonda G Barrett, PA-C      . ALPRAZolam Prudy Feeler) tablet 0.25 mg  0.25 mg Oral BID PRN Joline Salt Barrett, PA-C      . aspirin chewable tablet 81 mg  81  mg Oral Daily Kathleene Hazel, MD   81 mg at 08/17/12 0917  . atorvastatin (LIPITOR) tablet 80 mg  80 mg Oral q1800 Rhonda G Barrett, PA-C   80 mg at 08/16/12 1747  . carvedilol (COREG) tablet 3.125 mg  3.125 mg Oral BID WC Ok Anis, NP   3.125 mg at 08/17/12 0919  . eptifibatide (INTEGRILIN) 0.75 mg/mL bolus via infusion 11,600 mcg  180 mcg/kg Intravenous Once Peter M [place], MD      . feeding supplement (ENSURE COMPLETE) liquid 237 mL  237 mL Oral BID PRN Ailene Ards, RD      . heparin ADULT infusion 100 units/mL (25000 units/250 mL)  1,100 Units/hr Intravenous Continuous Peter M [place], MD 11 mL/hr at 08/17/12 0700 1,100 Units/hr at 08/17/12 0700  . lisinopril (PRINIVIL,ZESTRIL) tablet 2.5 mg  2.5 mg Oral Daily Ok Anis, NP   2.5 mg at 08/15/12 1300  . magnesium hydroxide (MILK OF MAGNESIA) suspension 30 mL  30 mL Oral Daily PRN Peter M [place], MD   30 mL at 08/16/12 1012  . morphine 2 MG/ML injection 2 mg  2 mg Intravenous Q1H PRN Kathleene Hazel, MD      . morphine 2 MG/ML injection 4 mg  4 mg Intravenous Once E. Jairo Ben, MD      . nitroGLYCERIN (NITROSTAT) SL tablet 0.4 mg  0.4 mg Sublingual Q5 Min x 3 PRN Joline Salt Barrett, PA-C   0.4  mg at 08/15/12 1042  . ondansetron (ZOFRAN) injection 4 mg  4 mg Intravenous Q6H PRN Rhonda G Barrett, PA-C      . oxyCODONE-acetaminophen (PERCOCET/ROXICET) 5-325 MG per tablet 1-2 tablet  1-2 tablet Oral Q4H PRN Kathleene Hazel, MD   2 tablet at 08/15/12 1300  . pantoprazole (PROTONIX) EC tablet 40 mg  40 mg Oral Daily Peter M [place], MD   40 mg at 08/17/12 0917  . sodium chloride 0.9 % injection 3 mL  3 mL Intravenous Q12H Rhonda G Barrett, PA-C   3 mL at 08/16/12 2200  . sodium chloride 0.9 % injection 3 mL  3 mL Intravenous PRN Rhonda G Barrett, PA-C      . Ticagrelor (BRILINTA) tablet 90 mg  90 mg Oral BID Kathleene Hazel, MD   90 mg at 08/17/12 0917  . warfarin (COUMADIN) tablet 7.5 mg   7.5 mg Oral ONCE-1800 Gwenlyn Found Carney, RPH      . Warfarin - Pharmacist Dosing Inpatient   Does not apply q1800 Peter M [place], MD      . zolpidem [facility]) tablet 5 mg  5 mg Oral QHS PRN Darrol Jump, PA-C        History   Social History  . Marital Status: Divorced    Spouse Name: N/A    Number of Children: N/A  . Years of Education: N/A   Occupational History  . Unemployed    Social History Main Topics  . Smoking status: Current Every Day Smoker -- 0.50 packs/day for 40 years  . Smokeless tobacco: Not on file  . Alcohol Use: 3.6 oz/week    6 Cans of beer per week  . Drug Use: No  . Sexually Active: Not on file   Other Topics Concern  . Not on file   Social History Narrative   Patient lives alone in a trailer. There is no family history of premature coronary artery disease in either parent or any other siblings.     History reviewed. No pertinent family history.  Review of Systems:  General: negative for chills, fever, night sweats or weight changes Cardiovascular: see above Dermatological: negative for new rash Respiratory: negative for cough or wheezing Urologic: negative for hematuria Abdominal: negative for nausea, vomiting, diarrhea, bright red blood per rectum, melena, or hematemesis Neurologic: negative for visual changes, syncope, or dizziness All other systems reviewed and are otherwise negative except as noted above.  Physical Exam: Blood pressure 104/74, pulse 65, temperature 98.7 F (37.1 C), temperature source Oral, resp. rate 18, height 6\' 1"  (1.854 m), weight 149 lb 14.6 oz (68 kg), SpO2 96.00%. Wt Readings from Last 3 Encounters:  08/17/12 149 lb 14.6 oz (68 kg)  08/17/12 149 lb 14.6 oz (68 kg)  08/17/12 149 lb 14.6 oz (68 kg)    General: Well developed, well nourished, in no acute distress. Head: Normocephalic, atraumatic, sclera non-icteric, no xanthomas, nares are without discharge.  Neck: Negative for carotid bruits. JVD not  elevated. Lungs: Clear bilaterally to auscultation without wheezes, rales, or rhonchi. Breathing is unlabored. Heart: RRR with S1 S2. No murmurs, rubs, or gallops appreciated. Abdomen: Soft, non-tender, non-distended with normoactive bowel sounds. No hepatomegaly. No rebound/guarding. No obvious abdominal masses. Msk:  Strength and tone appear normal for age. Extremities: No clubbing or cyanosis. No edema.  Distal pedal pulses are non palp on right and 2+ on left. R fem cath site, small non tender hematoma Neuro: Alert and oriented X 3. No  facial asymmetry. No focal deficit. Moves all extremities spontaneously. Psych:  Responds to questions appropriately with a normal affect.   Accessory Clinical Findings:  EKG - nsr, 63 bpm, ASMI, t wave abnormality anterolateral leads   Assessment & Plan:  1. Acute anterior STEMI/CAD: s/p PCI/DES of the LAD on 6/10, with reocclusion  of the stented segmented following cessation of integrelin prior to scheduled vascular surgery 08/15/12 for right femoral thrombectomy, successful PTCA/thrombectomy of the LAD performed.  On Heparin infusion. Continue ASA and Brilinta. Cont statin, bb. Continue cardiac Rehab. Transfer to telemetry today.  2. ICM: EF 35-40%. Euvolemic on exam. Continue bb and acei. Titration limited by low BP.  3. R groin hematoma. Distal aortogram 6/18 showed patent iliac and right common femoral arteries with no obvious filling defects.  Hematoma resolved. Plan conservative management.   4. LV Thrombus: continue heparin. Will bridge back to coumadin. Home when INR therapeutic.  5. Tobacco Abuse: cessation advised  5. NSVT: resolved. Continue bb  6. HTN: stable.    Theron Arista [facility] 08/17/2012 2:48 PM  I spoke with Mr. Noreene Filbert father at the patient's request. He tells me that Mr. Lucien Mons is a long time alcoholic. He told me that his son is shocked by his recent medical illness and is planning to quit.   Peter [place] MD, [facility]

## 2012-08-18 LAB — CBC
Hemoglobin: 11.6 g/dL — ABNORMAL LOW (ref 13.0–17.0)
MCHC: 33.1 g/dL (ref 30.0–36.0)
Platelets: 246 10*3/uL (ref 150–400)

## 2012-08-18 LAB — HEPARIN LEVEL (UNFRACTIONATED)
Heparin Unfractionated: 0.8 IU/mL — ABNORMAL HIGH (ref 0.30–0.70)
Heparin Unfractionated: 0.78 IU/mL — ABNORMAL HIGH (ref 0.30–0.70)

## 2012-08-18 LAB — PROTIME-INR
INR: 1.09 (ref 0.00–1.49)
Prothrombin Time: 14 seconds (ref 11.6–15.2)

## 2012-08-18 MED ORDER — WARFARIN SODIUM 10 MG PO TABS
10.0000 mg | ORAL_TABLET | Freq: Once | ORAL | Status: AC
  Administered 2012-08-18: 10 mg via ORAL
  Filled 2012-08-18: qty 1

## 2012-08-18 NOTE — Progress Notes (Signed)
ANTICOAGULATION CONSULT NOTE - Follow Up Consult  Pharmacy Consult for Heparin Indication: LV thrombus, new femoral thrombus  No Known Allergies  Labs:  Recent Labs  08/16/12 0410 08/17/12 0440 08/18/12 0545 08/18/12 1415  HGB 11.8* 11.7* 11.6*  --   HCT 34.8* 34.2* 35.0*  --   PLT 216 237 246  --   LABPROT 13.2 12.6 14.0  --   INR 1.01 0.95 1.09  --   HEPARINUNFRC 0.34 0.57 0.80* 0.78*  CREATININE 0.85  --   --   --     Estimated Creatinine Clearance: 96.9 ml/min (by C-G formula based on Cr of 0.85).  Assessment: 57 year old male admitted 08/07/2012 with STEMI with LAD stent, developed RCFA clot, and subsequent LAD stent thrombosis.  Pharmacy consulted to resume heparin and warfarin for new LV thrombus.  Heparin level above goal,  CBC stable. Groin hematoma noted per MD notes, per RN looks smaller today.  No issues/problems with heparin infusion per RN.     Goal Heparin level goal = 0.3-0.7  Plan:  Decrease Heparin to 900 units/hr Check heparin level 6 hours after rate change. Daily heparin level/cbc  Wendie Simmer, PharmD, BCPS Clinical Pharmacist  Pager: [phone]

## 2012-08-18 NOTE — Progress Notes (Signed)
ANTICOAGULATION CONSULT NOTE - Follow Up Consult  Pharmacy Consult for Heparin > warfarin Indication: LV thrombus, new femoral thrombus  No Known Allergies  Labs:  Recent Labs  08/16/12 0410 08/17/12 0440 08/18/12 0545  HGB 11.8* 11.7* 11.6*  HCT 34.8* 34.2* 35.0*  PLT 216 237 246  LABPROT 13.2 12.6 14.0  INR 1.01 0.95 1.09  HEPARINUNFRC 0.34 0.57 0.80*  CREATININE 0.85  --   --     Estimated Creatinine Clearance: 96.9 ml/min (by C-G formula based on Cr of 0.85).  Assessment: 57 year old male admitted 08/07/2012 with STEMI with LAD stent, developed RCFA clot, and subsequent LAD stent thrombosis.  Pharmacy consulted to resume heparin and warfarin for new LV thrombus.  Heparin level above goal,  CBC stable. Groin hematoma noted per MD notes, per RN looks smaller today.  No issues/problems with heparin infusion per RN.  INR still below goal.     Goal Heparin level goal = 0.3-0.7, INR 2-3  Plan:  Decrease Heparin to 1000 units/hr Check heparin level 6 hours after rate change. Coumadin 10mg  po x 1 dose tonight. Daily INR, CBC, and heparin level.  Wendie Simmer, PharmD, BCPS Clinical Pharmacist  Pager: [phone]

## 2012-08-18 NOTE — Progress Notes (Signed)
[facility]  Daily Progress Note  Assessment/Planning: Resolved R femoral thrombosis, RLE PAD   ABI results noted: unclear if the change in ABI results due to embolization to tibial arteries or baseline PAD  At this point, pt is relatively asx from the leg viewpoint.  Follow-up in the office in 4-6 weeks with Dr. Hart Rochester  Subjective    No chest pain today or leg pain  Objective Filed Vitals:   08/17/12 1130 08/17/12 1800 08/17/12 2100 08/18/12 0452  BP: 104/74 100/69 95/57 101/58  Pulse: 65 76 75 73  Temp: 98.7 F (37.1 C) 98.7 F (37.1 C) 98.7 F (37.1 C) 98.7 F (37.1 C)  TempSrc: Oral Oral Oral Oral  Resp: 18 16 16 18   Height:      Weight:    153 lb 12.8 oz (69.763 kg)  SpO2: 96% 96% 99% 99%    Intake/Output Summary (Last 24 hours) at 08/18/12 0905 Last data filed at 08/18/12 1610  Gross per 24 hour  Intake    687 ml  Output      0 ml  Net    687 ml    PULM  CTAB CV  RRR GI  soft, NTND VASC  R femoral nodule palpable, palpable femoral pulse, no R pedal pulse, R foot appears perfused  Laboratory CBC    Component Value Date/Time   WBC 7.6 08/18/2012 0545   HGB 11.6* 08/18/2012 0545   HCT 35.0* 08/18/2012 0545   PLT 246 08/18/2012 0545    BMET    Component Value Date/Time   NA 136 08/16/2012 0410   K 4.1 08/16/2012 0410   CL 103 08/16/2012 0410   CO2 25 08/16/2012 0410   GLUCOSE 90 08/16/2012 0410   BUN 12 08/16/2012 0410   CREATININE 0.85 08/16/2012 0410   CALCIUM 9.2 08/16/2012 0410   GFRNONAA >90 08/16/2012 0410   GFRAA >90 08/16/2012 0410    Leonides Sake, MD [facility] Office: [phone] Pager: [phone]  08/18/2012, 9:05 AM

## 2012-08-18 NOTE — Progress Notes (Signed)
Patient ID: Sanders Manninen, male   DOB: 07-31-1955, 57 y.o.   MRN: [id number]  Cardiology Clinic Note  Name:  Yobany Vroom   DOB:  01/11/1956   MRN:  [id number] Date of Encounter: 08/18/2012, 9:51 AM Primary Care Provider:  No primary provider on file. Primary Cardiologist:  Dr. [place]   HPI: Confused about clots in stent and leg.  Wants to connect the two Tried to explain  Cannot go home with subRx INR  Problem List Past Medical History  Diagnosis Date  . Tobacco use   . Myocardial infarction   . Coronary artery disease   . GERD (gastroesophageal reflux disease)   . Seizures   . Alcoholism    Past Surgical History  Procedure Laterality Date  . None    . Tonsillectomy      Most Recent Cardiac Studies: 2D ECHO 08/08/12 Left ventricle: Akinesis of the anterior wall and the anterior septum. Apex is akinetic or dyskinetic. Findings are most consistent with apical clot. The cavity size was normal. Wall thickness was increased in a pattern of mild LVH. Systolic function was moderately reduced. The estimated ejection fraction was in the range of 35% to 40%. Transthoracic echocardiography. M-mode, complete 2D, spectral Doppler, and color Doppler. Height: Height: 185.4cm. Height: 73in. Weight: Weight: 66.8kg. Weight: 147lb. Body mass index: BMI: 19.4kg/m^2. Body surface area: BSA: 1.62m^2. Blood pressure: 135/68. Patient status: Inpatient. Location: Bedside.     Allergies No Known Allergies  Home Medications Current Facility-Administered Medications  Medication Dose Route Frequency Provider Last Rate Last Dose  . 0.9 %  sodium chloride infusion  250 mL Intravenous PRN Joline Salt Barrett, PA-C 10 mL/hr at 08/14/12 1405 250 mL at 08/14/12 1405  . 0.9 %  sodium chloride infusion   Intravenous Continuous Kathleene Hazel, MD 10 mL/hr at 08/17/12 2153    . acetaminophen (TYLENOL) tablet 650 mg  650 mg Oral Q4H PRN Rhonda G Barrett, PA-C      . ALPRAZolam Prudy Feeler) tablet 0.25 mg   0.25 mg Oral BID PRN Joline Salt Barrett, PA-C      . aspirin chewable tablet 81 mg  81 mg Oral Daily Kathleene Hazel, MD   81 mg at 08/17/12 0917  . atorvastatin (LIPITOR) tablet 80 mg  80 mg Oral q1800 Rhonda G Barrett, PA-C   80 mg at 08/17/12 1717  . carvedilol (COREG) tablet 3.125 mg  3.125 mg Oral BID WC Ok Anis, NP   3.125 mg at 08/18/12 0849  . feeding supplement (ENSURE COMPLETE) liquid 237 mL  237 mL Oral BID PRN Ailene Ards, RD      . heparin ADULT infusion 100 units/mL (25000 units/250 mL)  1,000 Units/hr Intravenous Continuous Buelah Rennie M [place], MD 10 mL/hr at 08/18/12 0827 1,000 Units/hr at 08/18/12 0827  . lisinopril (PRINIVIL,ZESTRIL) tablet 2.5 mg  2.5 mg Oral Daily Ok Anis, NP   2.5 mg at 08/15/12 1300  . magnesium hydroxide (MILK OF MAGNESIA) suspension 30 mL  30 mL Oral Daily PRN Zaylee Cornia M [place], MD   30 mL at 08/16/12 1012  . morphine 2 MG/ML injection 2 mg  2 mg Intravenous Q1H PRN Kathleene Hazel, MD      . morphine 2 MG/ML injection 4 mg  4 mg Intravenous Once E. Jairo Ben, MD      . nitroGLYCERIN (NITROSTAT) SL tablet 0.4 mg  0.4 mg Sublingual Q5 Min x 3 PRN Rhonda G Barrett, PA-C   0.4 mg at  08/15/12 1042  . ondansetron (ZOFRAN) injection 4 mg  4 mg Intravenous Q6H PRN Rhonda G Barrett, PA-C      . oxyCODONE-acetaminophen (PERCOCET/ROXICET) 5-325 MG per tablet 1-2 tablet  1-2 tablet Oral Q4H PRN Kathleene Hazel, MD   2 tablet at 08/15/12 1300  . pantoprazole (PROTONIX) EC tablet 40 mg  40 mg Oral Daily Lurlene Ronda M [place], MD   40 mg at 08/17/12 0917  . sodium chloride 0.9 % injection 3 mL  3 mL Intravenous Q12H Rhonda G Barrett, PA-C   3 mL at 08/17/12 1719  . sodium chloride 0.9 % injection 3 mL  3 mL Intravenous PRN Rhonda G Barrett, PA-C      . Ticagrelor (BRILINTA) tablet 90 mg  90 mg Oral BID Kathleene Hazel, MD   90 mg at 08/17/12 2150  . warfarin (COUMADIN) tablet 10 mg  10 mg Oral ONCE-1800 Shauntia Levengood M [place], MD       . Warfarin - Pharmacist Dosing Inpatient   Does not apply q1800 Makoa Satz M [place], MD      . zolpidem [facility]) tablet 5 mg  5 mg Oral QHS PRN Darrol Jump, PA-C        Physical Exam: Blood pressure 101/58, pulse 73, temperature 98.7 F (37.1 C), temperature source Oral, resp. rate 18, height 6\' 1"  (1.854 m), weight 153 lb 12.8 oz (69.763 kg), SpO2 99.00%. Wt Readings from Last 3 Encounters:  08/18/12 153 lb 12.8 oz (69.763 kg)  08/18/12 153 lb 12.8 oz (69.763 kg)  08/18/12 153 lb 12.8 oz (69.763 kg)    General: Well developed, well nourished, in no acute distress. Head: Normocephalic, atraumatic, sclera non-icteric, no xanthomas, nares are without discharge.  Neck: Negative for carotid bruits. JVD not elevated. Lungs: Clear bilaterally to auscultation without wheezes, rales, or rhonchi. Breathing is unlabored. Heart: RRR with S1 S2. No murmurs, rubs, or gallops appreciated. Abdomen: Soft, non-tender, non-distended with normoactive bowel sounds. No hepatomegaly. No rebound/guarding. No obvious abdominal masses. Msk:  Strength and tone appear normal for age. Extremities: No clubbing or cyanosis. No edema.  Distal pedal pulses are non palp on right and 2+ on left. R fem cath site, small non tender hematoma Neuro: Alert and oriented X 3. No facial asymmetry. No focal deficit. Moves all extremities spontaneously. Psych:  Responds to questions appropriately with a normal affect.   Accessory Clinical Findings:  EKG - nsr, 63 bpm, ASMI, t wave abnormality anterolateral leads   Assessment & Plan:  1. Acute anterior STEMI/CAD: s/p PCI/DES of the LAD on 6/10, with reocclusion  of the stented segmented following cessation of integrelin prior to scheduled vascular surgery 08/15/12 for right femoral thrombectomy, successful PTCA/thrombectomy of the LAD performed.  On Heparin infusion. Continue ASA and Brilinta. Cont statin, bb. Continue cardiac Rehab.    2. ICM: EF 35-40%. Euvolemic on exam.  Continue bb and acei. Titration limited by low BP.  3. R groin hematoma. Distal aortogram 6/18 showed patent iliac and right common femoral arteries with no obvious filling defects.  Hematoma resolved. Plan conservative management. No bruit on exam  4. LV Thrombus: continue heparin. Will bridge back to coumadin. Home when INR therapeutic. 10 mg written for today  5. Tobacco Abuse: cessation advised  5. NSVT: resolved. Continue bb  6. HTN: stable.    Theron Arista [facility] 08/18/2012 9:51 AM

## 2012-08-18 NOTE — Progress Notes (Signed)
CARDIAC REHAB PHASE I   PRE:  Rate/Rhythm: 77 sinus frequent PACs  BP:  Sitting: 114/62   SaO2: 100 RA  MODE:  Ambulation: 1300 ft   POST:  Rate/Rhythem: 73  BP:  Sitting: 126/72   SaO2: 97 RA  Pt ambulated 1300 ft with assist x1.  Pt tolerated walk well without any complaints.  Pt returned to bedside after walk with family in room.  Finished d/c education discussing exercise and NTG guidelines.  Pt declined Cardiac Rehab Phase II stating that it was too far for him to go via his bike.  Pt encouraged to walk with staff over weekend.  We will follow up on Monday. Fabio Pierce, MA, ACSM RCEP [phone]  Hazle Nordmann

## 2012-08-18 NOTE — Progress Notes (Signed)
ANTICOAGULATION CONSULT NOTE - Follow Up Consult  Pharmacy Consult for heparin Indication: LV thrombus w/ new femoral thrombus  Labs:  Recent Labs  08/16/12 0410 08/17/12 0440 08/18/12 0545 08/18/12 1415 08/18/12 2106  HGB 11.8* 11.7* 11.6*  --   --   HCT 34.8* 34.2* 35.0*  --   --   PLT 216 237 246  --   --   LABPROT 13.2 12.6 14.0  --   --   INR 1.01 0.95 1.09  --   --   HEPARINUNFRC 0.34 0.57 0.80* 0.78* 0.43  CREATININE 0.85  --   --   --   --     Assessment/Plan:  57yo male now therapeutic on heparin after rate decreases.  Will continue gtt at current rate and confirm stable with am labs.  Vernard Gambles, PharmD, BCPS  08/18/2012,11:02 PM

## 2012-08-19 LAB — CBC
HCT: 34.1 % — ABNORMAL LOW (ref 39.0–52.0)
MCH: 30.2 pg (ref 26.0–34.0)
MCHC: 33.4 g/dL (ref 30.0–36.0)
MCV: 90.2 fL (ref 78.0–100.0)
RDW: 12.7 % (ref 11.5–15.5)

## 2012-08-19 LAB — PROTIME-INR: INR: 1.29 (ref 0.00–1.49)

## 2012-08-19 MED ORDER — WARFARIN SODIUM 10 MG PO TABS
10.0000 mg | ORAL_TABLET | Freq: Once | ORAL | Status: AC
  Administered 2012-08-19: 10 mg via ORAL
  Filled 2012-08-19: qty 1

## 2012-08-19 NOTE — Progress Notes (Signed)
ANTICOAGULATION CONSULT NOTE - Follow Up Consult  Pharmacy Consult for Heparin and Coumadin Indication: LV thrombus, new femoral thrombus  No Known Allergies  Labs:  Recent Labs  08/17/12 0440 08/18/12 0545 08/18/12 1415 08/18/12 2106 08/19/12 0508  HGB 11.7* 11.6*  --   --  11.4*  HCT 34.2* 35.0*  --   --  34.1*  PLT 237 246  --   --  260  LABPROT 12.6 14.0  --   --  15.8*  INR 0.95 1.09  --   --  1.29  HEPARINUNFRC 0.57 0.80* 0.78* 0.43 0.54    Estimated Creatinine Clearance: 96.7 ml/min (by C-G formula based on Cr of 0.85).  Assessment: 57 year old male admitted 08/07/2012 with STEMI with LAD stent, developed RCFA clot, and subsequent LAD stent thrombosis.  Pharmacy consulted to resume heparin and warfarin for new LV thrombus.  Heparin level at goal, CBC stable. Groin hematoma resolved. No issues/problems with heparin infusion per RN.  INR still subtherapeutic, dose increased yesterday.     Goal Heparin level goal = 0.3-0.7 INR 2-3  Plan:  Cont Heparin at 900 units/hr Daily heparin level/cbc and PT/INR.  Coumadin 10mg  po x 1 dose tonight  Wendie Simmer, PharmD, BCPS Clinical Pharmacist  Pager: [phone]

## 2012-08-19 NOTE — Progress Notes (Signed)
Patient ID: Edward Cooper, male   DOB: 09-21-1955, 57 y.o.   MRN: [id number]  Cardiology Clinic Note  Name:  Eliam Snapp   DOB:  1955-07-17   MRN:  [id number] Date of Encounter: 08/19/2012, 8:47 AM Primary Care Provider:  No primary provider on file. Primary Cardiologist:  Dr. [place]   HPI: More resined to fact that he will be here likely until Tuesday for INR  Problem List Past Medical History  Diagnosis Date  . Tobacco use   . Myocardial infarction   . Coronary artery disease   . GERD (gastroesophageal reflux disease)   . Seizures   . Alcoholism    Past Surgical History  Procedure Laterality Date  . None    . Tonsillectomy      Most Recent Cardiac Studies: 2D ECHO 08/08/12 Left ventricle: Akinesis of the anterior wall and the anterior septum. Apex is akinetic or dyskinetic. Findings are most consistent with apical clot. The cavity size was normal. Wall thickness was increased in a pattern of mild LVH. Systolic function was moderately reduced. The estimated ejection fraction was in the range of 35% to 40%. Transthoracic echocardiography. M-mode, complete 2D, spectral Doppler, and color Doppler. Height: Height: 185.4cm. Height: 73in. Weight: Weight: 66.8kg. Weight: 147lb. Body mass index: BMI: 19.4kg/m^2. Body surface area: BSA: 1.9m^2. Blood pressure: 135/68. Patient status: Inpatient. Location: Bedside.     Allergies No Known Allergies  Home Medications Current Facility-Administered Medications  Medication Dose Route Frequency Provider Last Rate Last Dose  . 0.9 %  sodium chloride infusion  250 mL Intravenous PRN Joline Salt Barrett, PA-C 10 mL/hr at 08/14/12 1405 250 mL at 08/14/12 1405  . 0.9 %  sodium chloride infusion   Intravenous Continuous Kathleene Hazel, MD 10 mL/hr at 08/17/12 2153    . acetaminophen (TYLENOL) tablet 650 mg  650 mg Oral Q4H PRN Rhonda G Barrett, PA-C      . ALPRAZolam Prudy Feeler) tablet 0.25 mg  0.25 mg Oral BID PRN Joline Salt Barrett, PA-C       . aspirin chewable tablet 81 mg  81 mg Oral Daily Kathleene Hazel, MD   81 mg at 08/18/12 1053  . atorvastatin (LIPITOR) tablet 80 mg  80 mg Oral q1800 Rhonda G Barrett, PA-C   80 mg at 08/18/12 1722  . carvedilol (COREG) tablet 3.125 mg  3.125 mg Oral BID WC Ok Anis, NP   3.125 mg at 08/18/12 1722  . feeding supplement (ENSURE COMPLETE) liquid 237 mL  237 mL Oral BID PRN Ailene Ards, RD      . heparin ADULT infusion 100 units/mL (25000 units/250 mL)  900 Units/hr Intravenous Continuous Arvada Seaborn M [place], MD 9 mL/hr at 08/18/12 1512 900 Units/hr at 08/18/12 1512  . lisinopril (PRINIVIL,ZESTRIL) tablet 2.5 mg  2.5 mg Oral Daily Ok Anis, NP   2.5 mg at 08/18/12 1048  . magnesium hydroxide (MILK OF MAGNESIA) suspension 30 mL  30 mL Oral Daily PRN Kyian Obst M [place], MD   30 mL at 08/16/12 1012  . morphine 2 MG/ML injection 2 mg  2 mg Intravenous Q1H PRN Kathleene Hazel, MD      . morphine 2 MG/ML injection 4 mg  4 mg Intravenous Once E. Jairo Ben, MD      . nitroGLYCERIN (NITROSTAT) SL tablet 0.4 mg  0.4 mg Sublingual Q5 Min x 3 PRN Joline Salt Barrett, PA-C   0.4 mg at 08/15/12 1042  . ondansetron (ZOFRAN) injection 4 mg  4 mg Intravenous Q6H PRN Rhonda G Barrett, PA-C      . oxyCODONE-acetaminophen (PERCOCET/ROXICET) 5-325 MG per tablet 1-2 tablet  1-2 tablet Oral Q4H PRN Kathleene Hazel, MD   2 tablet at 08/15/12 1300  . pantoprazole (PROTONIX) EC tablet 40 mg  40 mg Oral Daily Demaris Bousquet M [place], MD   40 mg at 08/18/12 1048  . sodium chloride 0.9 % injection 3 mL  3 mL Intravenous Q12H Rhonda G Barrett, PA-C   3 mL at 08/18/12 1049  . sodium chloride 0.9 % injection 3 mL  3 mL Intravenous PRN Rhonda G Barrett, PA-C      . Ticagrelor (BRILINTA) tablet 90 mg  90 mg Oral BID Kathleene Hazel, MD   90 mg at 08/18/12 2210  . warfarin (COUMADIN) tablet 10 mg  10 mg Oral ONCE-1800 Anylah Scheib M [place], MD      . Warfarin - Pharmacist Dosing Inpatient    Does not apply q1800 Ilaisaane Marts M [place], MD      . zolpidem [facility]) tablet 5 mg  5 mg Oral QHS PRN Darrol Jump, PA-C        Physical Exam: Blood pressure 86/52, pulse 73, temperature 97.9 F (36.6 C), temperature source Oral, resp. rate 18, height 6\' 1"  (1.854 m), weight 153 lb 5.6 oz (69.56 kg), SpO2 100.00%. Wt Readings from Last 3 Encounters:  08/19/12 153 lb 5.6 oz (69.56 kg)  08/19/12 153 lb 5.6 oz (69.56 kg)  08/19/12 153 lb 5.6 oz (69.56 kg)    General: Well developed, well nourished, in no acute distress. Head: Normocephalic, atraumatic, sclera non-icteric, no xanthomas, nares are without discharge.  Neck: Negative for carotid bruits. JVD not elevated. Lungs: Clear bilaterally to auscultation without wheezes, rales, or rhonchi. Breathing is unlabored. Heart: RRR with S1 S2. No murmurs, rubs, or gallops appreciated. Abdomen: Soft, non-tender, non-distended with normoactive bowel sounds. No hepatomegaly. No rebound/guarding. No obvious abdominal masses. Msk:  Strength and tone appear normal for age. Extremities: No clubbing or cyanosis. No edema.  Distal pedal pulses are non palp on right and 2+ on left. R fem cath site, small non tender hematoma Neuro: Alert and oriented X 3. No facial asymmetry. No focal deficit. Moves all extremities spontaneously. Psych:  Responds to questions appropriately with a normal affect.   Accessory Clinical Findings:  EKG - nsr, 63 bpm, ASMI, t wave abnormality anterolateral leads   Assessment & Plan:  1. Acute anterior STEMI/CAD: s/p PCI/DES of the LAD on 6/10, with reocclusion  of the stented segmented following cessation of integrelin prior to scheduled vascular surgery 08/15/12 for right femoral thrombectomy, successful PTCA/thrombectomy of the LAD performed.  On Heparin infusion. Continue ASA and Brilinta. Cont statin, bb. Continue cardiac Rehab.    2. ICM: EF 35-40%. Euvolemic on exam. Continue bb and acei. Titration limited by low BP.  3.  R groin hematoma. Distal aortogram 6/18 showed patent iliac and right common femoral arteries with no obvious filling defects.  Hematoma resolved. Plan conservative management. No bruit on exam  4. LV Thrombus: continue heparin. Will bridge back to coumadin. Home when INR therapeutic. 10 mg written for today  5. Tobacco Abuse: cessation advised  5. NSVT: resolved. Continue bb  6. HTN: stable.   INR starting to move 1.2 today Suspect it will be Rx Tuesday   Alvester Eads [facility] 08/19/2012 8:47 AM

## 2012-08-20 LAB — CBC
Hemoglobin: 11.6 g/dL — ABNORMAL LOW (ref 13.0–17.0)
MCH: 30.1 pg (ref 26.0–34.0)
Platelets: 250 10*3/uL (ref 150–400)
RBC: 3.86 MIL/uL — ABNORMAL LOW (ref 4.22–5.81)

## 2012-08-20 LAB — PROTIME-INR
INR: 1.49 (ref 0.00–1.49)
Prothrombin Time: 17.6 seconds — ABNORMAL HIGH (ref 11.6–15.2)

## 2012-08-20 LAB — HEPARIN LEVEL (UNFRACTIONATED): Heparin Unfractionated: 0.35 IU/mL (ref 0.30–0.70)

## 2012-08-20 MED ORDER — WARFARIN SODIUM 2.5 MG PO TABS
12.5000 mg | ORAL_TABLET | Freq: Once | ORAL | Status: AC
  Administered 2012-08-20: 12.5 mg via ORAL
  Filled 2012-08-20: qty 1

## 2012-08-20 NOTE — Progress Notes (Signed)
CARDIAC REHAB PHASE I   PRE:  Rate/Rhythm: 77 SR    BP: sitting 100/54    SaO2:   MODE:  Ambulation: 1200 ft   POST:  Rate/Rhythm: 88 SR    BP: sitting 120/60     SaO2:   Tolerated very well, no c/o except boredom. Reinforced smoking cessation. He sts hes going to try his best and is interested in nicotine patch when he goes home. Sts he will not make his "old lady" smoke outside instead of inside. [id number]   Elissa Lovett [place] CES, ACSM 08/20/2012 12:03 PM

## 2012-08-20 NOTE — Progress Notes (Signed)
Patient ID: Gunther Zawadzki, male   DOB: Jun 07, 1955, 57 y.o.   MRN: [id number]  Cardiology Clinic Note  Name:  Miguelangel Korn   DOB:  05-Sep-1955   MRN:  [id number] Date of Encounter: 08/20/2012, 12:03 PM Primary Care Provider:  No primary provider on file. Primary Cardiologist:  Dr. Swaziland   HPI: Doing well. No complaints. Had a good weekend. No chest pain or SOB.  Problem List Past Medical History  Diagnosis Date  . Tobacco use   . Myocardial infarction   . Coronary artery disease   . GERD (gastroesophageal reflux disease)   . Seizures   . Alcoholism    Past Surgical History  Procedure Laterality Date  . None    . Tonsillectomy      Most Recent Cardiac Studies: 2D ECHO 08/08/12 Left ventricle: Akinesis of the anterior wall and the anterior septum. Apex is akinetic or dyskinetic. Findings are most consistent with apical clot. The cavity size was normal. Wall thickness was increased in a pattern of mild LVH. Systolic function was moderately reduced. The estimated ejection fraction was in the range of 35% to 40%. Transthoracic echocardiography. M-mode, complete 2D, spectral Doppler, and color Doppler. Height: Height: 185.4cm. Height: 73in. Weight: Weight: 66.8kg. Weight: 147lb. Body mass index: BMI: 19.4kg/m^2. Body surface area: BSA: 1.52m^2. Blood pressure: 135/68. Patient status: Inpatient. Location: Bedside.     Allergies No Known Allergies  Home Medications Current Facility-Administered Medications  Medication Dose Route Frequency Provider Last Rate Last Dose  . 0.9 %  sodium chloride infusion  250 mL Intravenous PRN Joline Salt Barrett, PA-C 10 mL/hr at 08/14/12 1405 250 mL at 08/14/12 1405  . 0.9 %  sodium chloride infusion   Intravenous Continuous Kathleene Hazel, MD 10 mL/hr at 08/17/12 2153    . acetaminophen (TYLENOL) tablet 650 mg  650 mg Oral Q4H PRN Rhonda G Barrett, PA-C      . ALPRAZolam Prudy Feeler) tablet 0.25 mg  0.25 mg Oral BID PRN Joline Salt Barrett, PA-C       . aspirin chewable tablet 81 mg  81 mg Oral Daily Kathleene Hazel, MD   81 mg at 08/20/12 0953  . atorvastatin (LIPITOR) tablet 80 mg  80 mg Oral q1800 Rhonda G Barrett, PA-C   80 mg at 08/19/12 1759  . carvedilol (COREG) tablet 3.125 mg  3.125 mg Oral BID WC Ok Anis, NP   3.125 mg at 08/20/12 0740  . feeding supplement (ENSURE COMPLETE) liquid 237 mL  237 mL Oral BID PRN Ailene Ards, RD      . heparin ADULT infusion 100 units/mL (25000 units/250 mL)  900 Units/hr Intravenous Continuous Peter M Swaziland, MD 9 mL/hr at 08/19/12 1943 900 Units/hr at 08/19/12 1943  . lisinopril (PRINIVIL,ZESTRIL) tablet 2.5 mg  2.5 mg Oral Daily Ok Anis, NP   2.5 mg at 08/20/12 0953  . magnesium hydroxide (MILK OF MAGNESIA) suspension 30 mL  30 mL Oral Daily PRN Peter M Swaziland, MD   30 mL at 08/16/12 1012  . morphine 2 MG/ML injection 2 mg  2 mg Intravenous Q1H PRN Kathleene Hazel, MD      . morphine 2 MG/ML injection 4 mg  4 mg Intravenous Once E. Jairo Ben, MD      . nitroGLYCERIN (NITROSTAT) SL tablet 0.4 mg  0.4 mg Sublingual Q5 Min x 3 PRN Joline Salt Barrett, PA-C   0.4 mg at 08/15/12 1042  . ondansetron (ZOFRAN) injection 4 mg  4 mg Intravenous Q6H PRN Rhonda G Barrett, PA-C      . oxyCODONE-acetaminophen (PERCOCET/ROXICET) 5-325 MG per tablet 1-2 tablet  1-2 tablet Oral Q4H PRN Kathleene Hazel, MD   2 tablet at 08/15/12 1300  . pantoprazole (PROTONIX) EC tablet 40 mg  40 mg Oral Daily Peter M Swaziland, MD   40 mg at 08/20/12 0953  . sodium chloride 0.9 % injection 3 mL  3 mL Intravenous Q12H Rhonda G Barrett, PA-C   3 mL at 08/19/12 2156  . sodium chloride 0.9 % injection 3 mL  3 mL Intravenous PRN Rhonda G Barrett, PA-C      . Ticagrelor (BRILINTA) tablet 90 mg  90 mg Oral BID Kathleene Hazel, MD   90 mg at 08/20/12 0953  . warfarin (COUMADIN) tablet 12.5 mg  12.5 mg Oral ONCE-1800 Ann Held, [facility]      . Warfarin - Pharmacist Dosing  Inpatient   Does not apply q1800 Peter M Swaziland, MD      . zolpidem [facility]) tablet 5 mg  5 mg Oral QHS PRN Darrol Jump, PA-C        Physical Exam: Blood pressure 101/62, pulse 77, temperature 98.2 F (36.8 C), temperature source Oral, resp. rate 20, height 6\' 1"  (1.854 m), weight 151 lb 9.6 oz (68.765 kg), SpO2 98.00%. Wt Readings from Last 3 Encounters:  08/20/12 151 lb 9.6 oz (68.765 kg)  08/20/12 151 lb 9.6 oz (68.765 kg)  08/20/12 151 lb 9.6 oz (68.765 kg)    General: Well developed, well nourished, in no acute distress. Head: Normocephalic, atraumatic, sclera non-icteric, no xanthomas, nares are without discharge.  Neck: Negative for carotid bruits. JVD not elevated. Lungs: Clear bilaterally to auscultation without wheezes, rales, or rhonchi. Breathing is unlabored. Heart: RRR with S1 S2. No murmurs, rubs, or gallops appreciated. Abdomen: Soft, non-tender, non-distended with normoactive bowel sounds. No hepatomegaly. No rebound/guarding. No obvious abdominal masses. Msk:  Strength and tone appear normal for age. Extremities: No clubbing or cyanosis. No edema.  Distal pedal pulses are non palp on right and 2+ on left. R fem cath site, small non tender hematoma Neuro: Alert and oriented X 3. No facial asymmetry. No focal deficit. Moves all extremities spontaneously. Psych:  Responds to questions appropriately with a normal affect.   Accessory Clinical Findings:  EKG - nsr, 63 bpm, ASMI, t wave abnormality anterolateral leads   Assessment & Plan:  1. Acute anterior STEMI/CAD: s/p PCI/DES of the LAD on 6/10, with reocclusion  of the stented segmented following cessation of integrelin prior to scheduled vascular surgery 08/15/12 for right femoral thrombectomy, successful PTCA/thrombectomy of the LAD performed.  On Heparin infusion. Continue ASA and Brilinta. Cont statin, bb. Continue cardiac Rehab.    2. ICM: EF 35-40%. Euvolemic on exam. Continue bb and acei. Titration limited  by low BP.  3. R groin hematoma. Distal aortogram 6/18 showed patent iliac and right common femoral arteries with no obvious filling defects.  Hematoma resolved. Plan conservative management. No bruit on exam  4. LV Thrombus: continue heparin. Will bridge back to coumadin. Home when INR therapeutic. INR 1.49 today.  5. Tobacco Abuse: cessation advised  5. NSVT: resolved. Continue bb  6. HTN: stable.    Theron Arista [facility] 08/20/2012 12:03 PM

## 2012-08-20 NOTE — Progress Notes (Signed)
ANTICOAGULATION CONSULT NOTE - Follow Up Consult  Pharmacy Consult for Warfarin Indication: LV thrombus, new femoral thrombus  No Known Allergies  Patient Measurements: Height: 6\' 1"  (185.4 cm) Weight: 151 lb 9.6 oz (68.765 kg) IBW/kg (Calculated) : 79.9 Heparin Dosing Weight: 68.7 kg  Vital Signs: Temp: 98.2 F (36.8 C) (06/23 0615) Temp src: Oral (06/23 0615) BP: 101/62 mmHg (06/23 0953) Pulse Rate: 77 (06/23 0615)  Labs:  Recent Labs  08/18/12 0545  08/18/12 2106 08/19/12 0508 08/20/12 0438  HGB 11.6*  --   --  11.4* 11.6*  HCT 35.0*  --   --  34.1* 34.8*  PLT 246  --   --  260 250  LABPROT 14.0  --   --  15.8* 17.6*  INR 1.09  --   --  1.29 1.49  HEPARINUNFRC 0.80*  < > 0.43 0.54 0.35  < > = values in this interval not displayed.  Estimated Creatinine Clearance: 95.6 ml/min (by C-G formula based on Cr of 0.85).   Assessment: 57 y.o. M who continues on heparin bridge to therapeutic INR with warfarin for LV thrombus. Heparin level is therapeutic this morning (HL 0.35 << 0.54, goal of 0.3-0.7). INR this morning remains SUBtherapeutic however is trending up slowly (INR 1.49 << 1.29, goal of 2-3). Hgb/Hct/Plt stable. No s/sx of bleeding noted.  Noted discharge plans are pending a therapeutic INR. Will increase warfarin dose slightly today to help with this. The patient has been educated on warfarin this admission.  Goal of Therapy:  INR 2-3 Heparin level 0.3-0.7 units/ml Monitor platelets by anticoagulation protocol: Yes   Plan:  1. Continue heparin at current rate of 900 units/hr (9 ml/hr) 2. Warfarin 12.5 mg x 1 dose at 1800 today 3. Will continue to monitor for any signs/symptoms of bleeding and will follow up with heparin level and PT/INR in the a.m.   Georgina Pillion, PharmD, BCPS Clinical Pharmacist Pager: [phone] 08/20/2012 10:55 AM

## 2012-08-21 LAB — PROTIME-INR: Prothrombin Time: 20.3 seconds — ABNORMAL HIGH (ref 11.6–15.2)

## 2012-08-21 LAB — CBC
MCH: 30.2 pg (ref 26.0–34.0)
MCV: 89.3 fL (ref 78.0–100.0)
Platelets: 263 10*3/uL (ref 150–400)
RDW: 12.7 % (ref 11.5–15.5)
WBC: 9.2 10*3/uL (ref 4.0–10.5)

## 2012-08-21 LAB — HEPARIN LEVEL (UNFRACTIONATED): Heparin Unfractionated: 0.44 IU/mL (ref 0.30–0.70)

## 2012-08-21 MED ORDER — WARFARIN SODIUM 2.5 MG PO TABS
12.5000 mg | ORAL_TABLET | Freq: Once | ORAL | Status: AC
  Administered 2012-08-21: 12.5 mg via ORAL
  Filled 2012-08-21: qty 1

## 2012-08-21 NOTE — Progress Notes (Signed)
ANTICOAGULATION CONSULT NOTE - Follow Up Consult  Pharmacy Consult for Warfarin Indication: LV thrombus, new femoral thrombus  No Known Allergies  Patient Measurements: Height: 6\' 1"  (185.4 cm) Weight: 151 lb 9.6 oz (68.765 kg) IBW/kg (Calculated) : 79.9 Heparin Dosing Weight: 68.7 kg  Vital Signs: Temp: 98.2 F (36.8 C) (06/24 0500) Temp src: Oral (06/24 0500) BP: 98/63 mmHg (06/24 0500) Pulse Rate: 77 (06/24 0500)  Labs:  Recent Labs  08/19/12 0508 08/20/12 0438 08/21/12 0435  HGB 11.4* 11.6* 12.1*  HCT 34.1* 34.8* 35.8*  PLT 260 250 263  LABPROT 15.8* 17.6* 20.3*  INR 1.29 1.49 1.81*  HEPARINUNFRC 0.54 0.35 0.44    Estimated Creatinine Clearance: 95.6 ml/min (by C-G formula based on Cr of 0.85).   Assessment: 57 y.o. M who continues on heparin bridge to therapeutic INR with warfarin for LV thrombus. Heparin level is therapeutic this morning (HL 0.44 << 0.35, goal of 0.3-0.7). INR this morning remains SUBtherapeutic however is trending up nicely (INR 1.81 << 1.49, goal of 2-3). Hgb/Hct/Plt stable. No s/sx of bleeding noted.  Noted discharge plans are pending a therapeutic INR. It is expected that the patient will reach therapeutic range tomorrow (6/25). The patient has been educated on warfarin this admission.  Goal of Therapy:  INR 2-3 Heparin level 0.3-0.7 units/ml Monitor platelets by anticoagulation protocol: Yes   Plan:  1. Continue heparin at current rate of 900 units/hr (9 ml/hr) 2. Warfarin 12.5 mg x 1 dose at 1800 today 3. Will continue to monitor for any signs/symptoms of bleeding and will follow up with heparin level and PT/INR in the a.m.   Georgina Pillion, PharmD, BCPS Clinical Pharmacist Pager: [phone] 08/21/2012 11:07 AM

## 2012-08-21 NOTE — Progress Notes (Signed)
CARDIAC REHAB PHASE I   PRE:  Rate/Rhythm: 71 SR  BP:  Supine:   Sitting: 88/42 left and right arm      Standing:    SaO2: RA  MODE:  Ambulation: 1600 ft   POST:  Rate/Rhythem: 71   BP:  Supine:   Sitting: 100/48  Standing:    SaO2: 100 RA  1406-1430 Patient tolerated walk well without chest pain or SOB.  Patient walked with a fast pace and gait steady.  Patient was educated on sitting up slowly on the side of the bed before standing. Patient is back to the side of the bed after walk with call light in reach.   Lindaann Slough M

## 2012-08-21 NOTE — Progress Notes (Signed)
Patient ID: Edward Cooper, male   DOB: 06-May-1955, 57 y.o.   MRN: [id number]  Cardiology Clinic Note  Name:  Edward Cooper   DOB:  06/03/1955   MRN:  [id number] Date of Encounter: 08/21/2012, 10:26 AM Primary Care Provider:  No primary provider on file. Primary Cardiologist:  Dr. Swaziland   HPI: Doing well. No complaints. No chest pain or SOB.  Problem List Past Medical History  Diagnosis Date  . Tobacco use   . Myocardial infarction   . Coronary artery disease   . GERD (gastroesophageal reflux disease)   . Seizures   . Alcoholism    Past Surgical History  Procedure Laterality Date  . None    . Tonsillectomy      Most Recent Cardiac Studies: 2D ECHO 08/08/12 Left ventricle: Akinesis of the anterior wall and the anterior septum. Apex is akinetic or dyskinetic. Findings are most consistent with apical clot. The cavity size was normal. Wall thickness was increased in a pattern of mild LVH. Systolic function was moderately reduced. The estimated ejection fraction was in the range of 35% to 40%. Transthoracic echocardiography. M-mode, complete 2D, spectral Doppler, and color Doppler. Height: Height: 185.4cm. Height: 73in. Weight: Weight: 66.8kg. Weight: 147lb. Body mass index: BMI: 19.4kg/m^2. Body surface area: BSA: 1.2m^2. Blood pressure: 135/68. Patient status: Inpatient. Location: Bedside.     Allergies No Known Allergies  Home Medications Current Facility-Administered Medications  Medication Dose Route Frequency Provider Last Rate Last Dose  . 0.9 %  sodium chloride infusion  250 mL Intravenous PRN Joline Salt Barrett, PA-C 10 mL/hr at 08/14/12 1405 250 mL at 08/14/12 1405  . 0.9 %  sodium chloride infusion   Intravenous Continuous Kathleene Hazel, MD 10 mL/hr at 08/17/12 2153    . acetaminophen (TYLENOL) tablet 650 mg  650 mg Oral Q4H PRN Rhonda G Barrett, PA-C      . ALPRAZolam Prudy Feeler) tablet 0.25 mg  0.25 mg Oral BID PRN Joline Salt Barrett, PA-C      . aspirin  chewable tablet 81 mg  81 mg Oral Daily Kathleene Hazel, MD   81 mg at 08/21/12 5093  . atorvastatin (LIPITOR) tablet 80 mg  80 mg Oral q1800 Rhonda G Barrett, PA-C   80 mg at 08/20/12 1705  . carvedilol (COREG) tablet 3.125 mg  3.125 mg Oral BID WC Ok Anis, NP   3.125 mg at 08/21/12 0737  . feeding supplement (ENSURE COMPLETE) liquid 237 mL  237 mL Oral BID PRN Ailene Ards, RD      . heparin ADULT infusion 100 units/mL (25000 units/250 mL)  900 Units/hr Intravenous Continuous Peter M Swaziland, MD 9 mL/hr at 08/20/12 2202 900 Units/hr at 08/20/12 2202  . lisinopril (PRINIVIL,ZESTRIL) tablet 2.5 mg  2.5 mg Oral Daily Ok Anis, NP   2.5 mg at 08/21/12 0923  . magnesium hydroxide (MILK OF MAGNESIA) suspension 30 mL  30 mL Oral Daily PRN Peter M Swaziland, MD   30 mL at 08/16/12 1012  . morphine 2 MG/ML injection 2 mg  2 mg Intravenous Q1H PRN Kathleene Hazel, MD      . morphine 2 MG/ML injection 4 mg  4 mg Intravenous Once E. Jairo Ben, MD      . nitroGLYCERIN (NITROSTAT) SL tablet 0.4 mg  0.4 mg Sublingual Q5 Min x 3 PRN Joline Salt Barrett, PA-C   0.4 mg at 08/15/12 1042  . ondansetron (ZOFRAN) injection 4 mg  4 mg Intravenous Q6H  PRN Joline Salt Barrett, PA-C      . oxyCODONE-acetaminophen (PERCOCET/ROXICET) 5-325 MG per tablet 1-2 tablet  1-2 tablet Oral Q4H PRN Kathleene Hazel, MD   2 tablet at 08/15/12 1300  . pantoprazole (PROTONIX) EC tablet 40 mg  40 mg Oral Daily Peter M Swaziland, MD   40 mg at 08/21/12 2956  . sodium chloride 0.9 % injection 3 mL  3 mL Intravenous Q12H Rhonda G Barrett, PA-C   3 mL at 08/19/12 2156  . sodium chloride 0.9 % injection 3 mL  3 mL Intravenous PRN Rhonda G Barrett, PA-C      . Ticagrelor (BRILINTA) tablet 90 mg  90 mg Oral BID Kathleene Hazel, MD   90 mg at 08/21/12 2130  . Warfarin - Pharmacist Dosing Inpatient   Does not apply q1800 Peter M Swaziland, MD      . zolpidem [facility]) tablet 5 mg  5 mg Oral QHS PRN  Darrol Jump, PA-C        Physical Exam: Blood pressure 98/63, pulse 77, temperature 98.2 F (36.8 C), temperature source Oral, resp. rate 18, height 6\' 1"  (1.854 m), weight 151 lb 9.6 oz (68.765 kg), SpO2 100.00%. Wt Readings from Last 3 Encounters:  08/20/12 151 lb 9.6 oz (68.765 kg)  08/20/12 151 lb 9.6 oz (68.765 kg)  08/20/12 151 lb 9.6 oz (68.765 kg)    General: Well developed, well nourished, in no acute distress. Head: Normocephalic, atraumatic, sclera non-icteric, no xanthomas, nares are without discharge.  Neck: Negative for carotid bruits. JVD not elevated. Lungs: Clear bilaterally to auscultation without wheezes, rales, or rhonchi. Breathing is unlabored. Heart: RRR with S1 S2. No murmurs, rubs, or gallops appreciated. Abdomen: Soft, non-tender, non-distended with normoactive bowel sounds. No hepatomegaly. No rebound/guarding. No obvious abdominal masses. Msk:  Strength and tone appear normal for age. Extremities: No clubbing or cyanosis. No edema.  Distal pedal pulses are non palp on right and 2+ on left. R fem cath site, small non tender hematoma Neuro: Alert and oriented X 3. No facial asymmetry. No focal deficit. Moves all extremities spontaneously. Psych:  Responds to questions appropriately with a normal affect.   Accessory Clinical Findings:  EKG - nsr, 63 bpm, ASMI, t wave abnormality anterolateral leads   Assessment & Plan:  1. Acute anterior STEMI/CAD: s/p PCI/DES of the LAD on 6/10, with reocclusion  of the stented segmented following cessation of integrelin prior to scheduled vascular surgery 08/15/12 for right femoral thrombectomy, successful PTCA/thrombectomy of the LAD performed.  On Heparin infusion. Continue ASA and Brilinta. Cont statin, bb. Continue cardiac Rehab.    2. ICM: EF 35-40%. Euvolemic on exam. Continue bb and acei. Titration limited by low BP.  3. R groin hematoma. Distal aortogram 6/18 showed patent iliac and right common femoral arteries  with no obvious filling defects.  Hematoma resolved. Plan conservative management. No bruit on exam  4. LV Thrombus: continue heparin. Will bridge back to coumadin. Home when INR therapeutic. INR 1.81 today. Anticipate DC tomorrow.  5. Tobacco Abuse: cessation advised  5. NSVT: resolved. Continue bb  6. HTN: stable.    Theron Arista [facility] 08/21/2012 10:26 AM

## 2012-08-22 ENCOUNTER — Telehealth: Payer: Self-pay

## 2012-08-22 ENCOUNTER — Encounter (HOSPITAL_COMMUNITY): Payer: Self-pay | Admitting: Physician Assistant

## 2012-08-22 ENCOUNTER — Other Ambulatory Visit: Payer: Self-pay | Admitting: *Deleted

## 2012-08-22 LAB — CBC
HCT: 35.8 % — ABNORMAL LOW (ref 39.0–52.0)
MCHC: 33.8 g/dL (ref 30.0–36.0)
MCV: 88.6 fL (ref 78.0–100.0)
Platelets: 257 10*3/uL (ref 150–400)
RDW: 12.6 % (ref 11.5–15.5)

## 2012-08-22 LAB — PROTIME-INR: INR: 2.26 — ABNORMAL HIGH (ref 0.00–1.49)

## 2012-08-22 MED ORDER — ATORVASTATIN CALCIUM 80 MG PO TABS: 80.0000 mg | ORAL_TABLET | Freq: Every day | ORAL | Status: DC

## 2012-08-22 MED ORDER — TICAGRELOR 90 MG PO TABS: 90.0000 mg | ORAL_TABLET | Freq: Two times a day (BID) | ORAL | Status: DC

## 2012-08-22 MED ORDER — NITROGLYCERIN 0.4 MG SL SUBL: 0.4000 mg | SUBLINGUAL_TABLET | SUBLINGUAL | Status: DC | PRN

## 2012-08-22 MED ORDER — PANTOPRAZOLE SODIUM 40 MG PO TBEC: 40.0000 mg | DELAYED_RELEASE_TABLET | Freq: Every day | ORAL | Status: DC

## 2012-08-22 MED ORDER — WARFARIN SODIUM 10 MG PO TABS
10.0000 mg | ORAL_TABLET | Freq: Once | ORAL | Status: DC
  Filled 2012-08-22: qty 1

## 2012-08-22 MED ORDER — LISINOPRIL 2.5 MG PO TABS: 2.5000 mg | ORAL_TABLET | Freq: Every day | ORAL | Status: DC

## 2012-08-22 MED ORDER — ASPIRIN 81 MG PO TABS: 81.0000 mg | ORAL_TABLET | Freq: Every day | ORAL | Status: AC

## 2012-08-22 MED ORDER — CARVEDILOL 3.125 MG PO TABS: 3.1250 mg | ORAL_TABLET | Freq: Two times a day (BID) | ORAL | Status: DC

## 2012-08-22 MED ORDER — WARFARIN SODIUM 10 MG PO TABS: 10.0000 mg | ORAL_TABLET | ORAL | Status: DC

## 2012-08-22 NOTE — Telephone Encounter (Signed)
TCM  

## 2012-08-22 NOTE — Progress Notes (Signed)
ANTICOAGULATION CONSULT NOTE - Follow Up Consult  Pharmacy Consult for heparin Indication: LV thrombus w/ new femoral thrombus  Labs:  Recent Labs  08/20/12 0438 08/21/12 0435 08/22/12 0425  HGB 11.6* 12.1* 12.1*  HCT 34.8* 35.8* 35.8*  PLT 250 263 257  LABPROT 17.6* 20.3* 24.0*  INR 1.49 1.81* 2.26*  HEPARINUNFRC 0.35 0.44 0.75*    Assessment: 57yo male now supratherapeutic on heparin after two levels at goal though trending up; also with therapeutic INR with plan to D/C home.  Goal of Therapy:  Heparin level 0.3-0.7 units/ml   Plan:  Will decrease heparin gtt to 800 units/hr and f/u with discharge.  Vernard Gambles, PharmD, BCPS  08/22/2012,5:22 AM

## 2012-08-22 NOTE — Progress Notes (Signed)
CARE MANAGEMENT NOTE 08/22/2012  Patient:  Edward Cooper,Edward Cooper   Account Number:  [id number]  Date Initiated:  08/08/2012  Documentation initiated by:  Junius Creamer  Subjective/Objective Assessment:   adm w mi     Action/Plan:   lives w fam   Anticipated DC Date:  08/22/2012   Anticipated DC Plan:  HOME W HOME HEALTH SERVICES      DC Planning Services  CM consult  Medication Assistance      Choice offered to / List presented to:          [facility] arranged  HH-1 RN      [facility] agency  [facility]   Status of service:  Completed, signed off Medicare Important Message given?   (If response is "NO", the following Medicare IM given date fields will be blank) Date Medicare IM given:   Date Additional Medicare IM given:    Discharge Disposition:  HOME W HOME HEALTH SERVICES  Per UR Regulation:  Reviewed for med. necessity/level of care/duration of stay  If discussed at Long Length of Stay Meetings, dates discussed:   08/14/2012  08/21/2012    Comments:  08/22/12- 1325- Donn Pierini RN, BSN [phone] Pt for d/c today- has been set up with [facility] in [place]. however will need PT/INR check on this Friday 08/24/12- [facility] unable to see pt this Friday but the [place] office can- pt states that he can not get back to [place] due to transportation issues. Have asked [facility] if they can assist with [facility] RN for PT/INR checks for 08/24/12 and possible another until pt can be seen in the [facility] clinic in [place]- appointment for 08/30/12- spoke with Lupita Leash with [facility] who has agreed to referral - HH-RN order in epic- spoke with pt regarding plan and pt is agreeable and appreciative. HH will start 08/24/12.  08-21-12 [address], [place] [phone] CM did speak to pt in reference to transportation and f/u visits for PT/INR checks. Pt does not have a PCP and CM did provide pt with information to the [facility] in [place]. Pt states he  will f/u at the Cardiologists office for labs. Unsure of cost at this time per visit. If the cost will be to expensive he will need to go to the clinic for a sliding scale fee. Pt stated he will  be using Medicaid Transportation at the cost of 5.00 and he will call and set up appointments for pick up. CM did make pt aware that [employer] is not guaranteed to be approved. Cm will continue to monitor for disposition needs.  08-20-12  LV Thrombus: continue heparin. Will bridge back to coumadin. Home when INR therapeutic. INR 1.49 today. Tomi Bamberger, [place] [phone]   08-15-12 1:20pm Avie Arenas, RNBSN [phone] STEMI - emergent cath lab - Vfib - defibed.  tx to ICU post cath.  08-10-12 1205 Tomi Bamberger, RN,BSN [phone] CM did speak to pt and the [facility] was notified on 08-08-12. FC is looking at the case to see if pt is eligible for medicaid, however this may not be an easy task. CM did provide pt with list for PCP f/u in [place] and list that obtained the [facility] DSS #. The [facility] is in [place]. and pt will need to f/u with this resource. Pt states he rides a bike and does not drive. CM did provide pt with the application for eligibility for the [facility]. [employer] pt assistance form is on the chart for brilinta. MD please fill out and pt will need a 30 day free Rx no refills. Pt will have to fax remaining forms to [employer] with tax records. No further needs from CM at this time.   6/11 1237p debbie dowell rn,bsn spoke w pt. no ins or pcp at present. pt [place] in [place] gave pt 30day free and copay assist card for brilinta. pt assist form for brilinta in shadow chart for md to sign. gave pt pharm discount card that may help w brand names. gave pt [place] [employer] w inform on clinics to establish pcp.

## 2012-08-22 NOTE — Telephone Encounter (Signed)
D/c today 6/25 Will attempt TCM #1 6/26

## 2012-08-22 NOTE — Telephone Encounter (Signed)
Message copied by [facility], Duru Reiger E on Wed Aug 22, 2012 11:30 AM ------      Message from: Coralee Rud      Created: Wed Aug 22, 2012 11:26 AM      Regarding: transition of care/tcm/ph at [facility]       08/30/12 at 3:00 dr Kirke Corin ------

## 2012-08-22 NOTE — Progress Notes (Signed)
Patient Name: Edward Cooper Date of Encounter: 08/22/2012     Principal Problem:   ST elevation myocardial infarction (STEMI) of anterior wall, initial episode of care Active Problems:   Hypertension    Tobacco abuse   Ventricular tachycardia   LV (left ventricular) mural thrombus with acute MI   CAD (coronary artery disease)   Cardiomyopathy, ischemic   Groin hematoma   Non-occlusive thrombus    SUBJECTIVE:Patient is feeling good. No cp, sob, or palpitations.   OBJECTIVE  Filed Vitals:   08/21/12 0500 08/21/12 1353 08/21/12 2140 08/22/12 0520  BP: 98/63 101/59 105/60 103/60  Pulse: 77 83 82 84  Temp: 98.2 F (36.8 C) 98.6 F (37 C) 98.8 F (37.1 C) 98.7 F (37.1 C)  TempSrc: Oral  Oral Oral  Resp: 18 18 18 18   Height:      Weight:    68.7 kg (151 lb 7.3 oz)  SpO2: 100% 100% 100% 100%   No intake or output data in the 24 hours ending 08/22/12 0845 Weight change:   PHYSICAL EXAM  General:  Well developed, well nourished, in no acute distress. Head:  Normocephalic, atraumatic, sclera non-icteric, no xanthomas, nares are without discharge. Neck:  Supple without bruits or JVD. Lungs:   Resp regular and unlabored, CTA. Heart:RRR no s3, s4, or murmurs. Abdomen: Soft, non-tender, non-distended, BS + x 4.  Msk:  trength and tone appears normal for age. Extremities: No clubbing, cyanosis or edema. DP 1+ on right, 2+ on left. R fem cath site small non tender hematoma Neuro: Alert and oriented X 3. Moves all extremities spontaneously. Psych: Normal affect.  LABS:  Recent Labs     08/21/12  0435  08/22/12  0425  WBC  9.2  10.0  HGB  12.1*  12.1*  HCT  35.8*  35.8*  MCV  89.3  88.6  PLT  263  257   No results found for this basename: VITAMINB12, FOLATE, FERRITIN, TIBC, IRON, RETICCTPCT,  in the last 72 hours No results found for this basename: DDIMER,  in the last 72 hours  Recent Labs Lab 08/16/12 0410  NA 136  K 4.1  CL 103  CO2 25  BUN 12    CREATININE 0.85  CALCIUM 9.2  GLUCOSE 90   No results found for this basename: HGBA1C,  in the last 72 hours No results found for this basename: CKTOTAL, CKMB, CKMBINDEX, TROPONINI,  in the last 72 hours No components found with this basename: POCBNP,  No results found for this basename: CHOL, HDL, LDLCALC, TRIG, CHOLHDL, LDLDIRECT,  in the last 72 hours No results found for this basename: TSH, T4TOTAL, FREET3, T3FREE, THYROIDAB,  in the last 72 hours   TELE nsr, 72, occasional pac's  ECG nsr, 63bpm, ASMI, t wave abnormality anterolateral leads  Radiology/Studies:  Ct Pelvis W Contrast  08/12/2012   *RADIOLOGY REPORT*  Clinical Data:  Post cardiac cath with expanding the right groin hematoma.  CT PELVIS WITH CONTRAST  Technique:  Multidetector CT imaging of the pelvis was performed using the standard protocol following the bolus administration of intravenous contrast.  Contrast: 80mL OMNIPAQUE IOHEXOL 300 MG/ML  SOLN  Comparison:   None.  Findings:  There is contrast within the arterial system.  There is fluid and stranding in the right groin consistent with recent catheterization.  There is thrombus in the distal right common femoral artery and extending into the proximal superficial femoral artery.  The thrombus roughly measures 1.5 cm in  length.  There is flow in the superficial femoral artery beyond the thrombus. Incidentally, there is an early takeoff of a femoral profunda branch above the thrombus.  The fluid collection around the right common femoral artery measures 2.1 x 2.2 cm.  Small punctate densities along the anterior aspect of the hematoma on image 36. The small punctate densities may represent contrast and bleeding but no evidence for a large pseudoaneurysm.  The iliac arteries are patent.  No gross abnormality to the prostate, seminal vesicles or urinary bladder.  There is no significant free fluid within the pelvis.  No acute bony abnormality.  IMPRESSION: Small fluid collection  around the right common femoral artery, measuring 2.2 cm, is consistent with a small hematoma.  Punctate densities along the anterior of the hematoma could represent small areas of bleeding but there is no evidence for a large pseudoaneurysm.  There is focal thrombus within the distal common femoral artery and involving the proximal superficial femoral artery.  These results were called by telephone on 08/12/2012 at 07:05 a.m. to Dr. Brion Aliment, who verbally acknowledged these results.   Original Report Authenticated By: Richarda Overlie, M.D.   Portable Chest X-ray 1 View  08/07/2012   *RADIOLOGY REPORT*  Clinical Data: Myocardial infarction.  PORTABLE CHEST - 1 VIEW  Comparison: None.  Findings: The heart size is normal.  No edema or pulmonary consolidation is identified.  No pleural fluid is seen.  IMPRESSION: No active disease.   Original Report Authenticated By: Irish Lack, M.D.    Current Medications:  . aspirin  81 mg Oral Daily  . atorvastatin  80 mg Oral q1800  . carvedilol  3.125 mg Oral BID WC  . lisinopril  2.5 mg Oral Daily  . morphine  4 mg Intravenous Once  . pantoprazole  40 mg Oral Daily  . sodium chloride  3 mL Intravenous Q12H  . Ticagrelor  90 mg Oral BID  . Warfarin - Pharmacist Dosing Inpatient   Does not apply q1800    ASSESSMENT AND PLAN: 1. Acute anterior STEMI/CAD: s/p PCI/DES of the LAD on 6/10, with reocclusion of the stented segmented following cessation of integrelin prior to scheduled vascular surgery 08/15/12 for right femoral thrombectomy, successful PTCA/thrombectomy of the LAD performed. On Heparin infusion. Continue ASA and Brilinta. Cont statin, bb.   2. ICM: EF 35-40%. Euvolemic on exam. Continue bb and acei. Titration limited by low BP.   3. R groin hematoma. Distal aortogram 6/18 showed patent iliac and right common femoral arteries with no obvious filling defects. Hematoma resolved. Plan conservative management. No bruit on exam. Right pedal pulse had been  absent,  1+ noted today.  4. LV Thrombus: INR is now therapeutic at 2.26. D/c heparin. Continue on Coumadin.  5. Tobacco Abuse: cessation advised   5. NSVT: resolved. Continue bb   6. HTN: stable.   Will need appointment for Coumadin check on Friday 6/27 in [place] office and follow up visit in 10 days-2 weeks.  Signed, Darliss Cheney, PA-S R. Hurman Horn, PA-C 08/22/2012, 8:45 AM Patient seen and examined and history reviewed. Agree with above findings and plan. Coumadin now therapeutic. Can DC heparin and DC home today. Needs close follow up of coumadin and will need follow up visit in [place] office. Repeat Echo 3 months.  Theron Arista [facility] 08/22/2012 9:49 AM

## 2012-08-22 NOTE — Progress Notes (Signed)
ANTICOAGULATION CONSULT NOTE - Follow Up Consult  Pharmacy Consult for Warfarin Indication: LV thrombus, new femoral thrombus  No Known Allergies  Patient Measurements: Height: 6\' 1"  (185.4 cm) Weight: 151 lb 7.3 oz (68.7 kg) IBW/kg (Calculated) : 79.9 Heparin Dosing Weight: 68.7 kg  Vital Signs: Temp: 98.7 F (37.1 C) (06/25 0520) Temp src: Oral (06/25 0520) BP: 103/60 mmHg (06/25 0520) Pulse Rate: 84 (06/25 0520)  Labs:  Recent Labs  08/20/12 0438 08/21/12 0435 08/22/12 0425  HGB 11.6* 12.1* 12.1*  HCT 34.8* 35.8* 35.8*  PLT 250 263 257  LABPROT 17.6* 20.3* 24.0*  INR 1.49 1.81* 2.26*  HEPARINUNFRC 0.35 0.44 0.75*    Estimated Creatinine Clearance: 95.4 ml/min (by C-G formula based on Cr of 0.85).   Assessment: 57 y.o. M who continues on heparin bridge to therapeutic INR with warfarin for LV thrombus. Heparin level was slightly supratherapeutic this morning and rate was previously adjusted. INR this morning is therapeutic. Hgb/Hct/Plt stable. No s/sx of bleeding noted.  Goal of Therapy:  INR 2-3 Heparin level 0.3-0.7 units/ml Monitor platelets by anticoagulation protocol: Yes   Plan:  Recommend a discharge Coumadin regimen of 10mg  daily with a PT/INR check on Friday 6/27.  Estella Husk, Pharm.D., BCPS Clinical Pharmacist Phone: [phone] or [phone] Pager: [phone] 08/22/2012, 10:12 AM

## 2012-08-22 NOTE — Discharge Summary (Signed)
Patient seen and examined and history reviewed. Agree with above findings and plan. See earlier rounding note.   Edward Cooper 08/22/2012 3:54 PM

## 2012-08-22 NOTE — Discharge Summary (Signed)
Discharge Summary   Patient ID: Edward Cooper MRN: [id number], DOB/AGE: 1955/09/23 57 y.o. Admit date: 08/07/2012 D/C date:     08/22/2012  Primary Cardiologist: New to [place] this admission and seen mostly by dr. Sw[place], but should follow up with Dr. Kirke Corin in [facility]  Primary Discharge Diagnoses:  1. Acute anterior STEMI/newly diagnosed CAD - 08/07/12: anterior STEMI s/p DES x 2 to ostial LAD, thrombus extraction of mid LAD - 08/15/12: recurrent acute anterior STEMI with occlusion of LAD stent, (following cessation of integrellin prior to scheduled vascular surgery 08/15/12 for right femoral thrombectomy - later deferred), s/p PTCA/thrombectomy of LAD  2.  Ischemic cardiomyopathy EF 35-40% 3. LV thrombus by echo this admission 4. R groin hematoma - medical management for now, outpatient vascular followup 5. Focal thrombus within the distal common femoral artery and involving the proximal superficial femoral artery by CT 6/15 - medical management for now, outpatient vascular followup; R ABI 0.39 6. Tobacco abuse 7. NSVT - around time of first STEMI 8. Ventricular fibrillation - requiring defib x 2 at time of second STEMI 9. HTN, also with hypotension limiting med titration  Secondary Discharge Diagnoses:  1. H/o EtOH abuse  Hospital Course: Edward Cooper is a 57 y/o M with history of tobacco abuse and no prior history of CAD who presented to [facility] with chest pain and STEMI. He has not seen a physician in more than 5 years. The night prior to admission, he had substernal chest pain that he thought was indigestion. He was able to go to sleep but when he woke up the following day he was having severe chest pain. When his condition did not improve, he finally called EMS. Upon EMS arrival, he was in significant distress. His ECG was consistent with an anterior/septal STEMI. He was given aspirin 81 mg x4, sublingual nitroglycerin x2, and fentanyl 75 mcg. SBP was initially 180, and  dropped near 100 after the 2nd NTG. After the nitroglycerin, he had some ventricular tachycardia. This was captured on telemetry. It stopped prior to administration of amiodarone so this was not given. He was taken emergently to the cath lab which demonstrated a rupture plaque in the LAD ostium and thrombotic occlusion of the mid LAD. He underwent successful DES x 2 to the LAD ostium with thrombus extraction of the mid LAD. (Otherwise had nonobstructive disease in RCA.) LVEF was estimated at 40-45%. DAPT for 1 year was recommended. He was started on post-MI therapy including statin, ACEI, and BB. He was started on Brilinta. He was continued on IV integrellin for 18 hours and reduced dose angiomax for 2 hours. The following day he was noted to have NSVT and AIVR on telemetry felt related to MI and reperfusion, thus BB was increased. 2D Echo 08/08/12 showed EF 35-40% and apex that was akinetic/dyskinetic, with findings most consistent with apical thrombus. He was started on Lovenox and Coumadin with plan to discharge when INR was therapeutic. He was initially kept off ASA given need for Coumadin & Brilinta.   On 08/12/12 he developed an enlarging R groin hematoma. CT pelvis showed small fluid collection around RCFA 2.2cm c/w small hematoma, but no evidence of large pseudoaneurysm. There was focal thrombus within the distal common femoral artery and involving the proximal superficial femoral artery. H/H were stable. Dr. Graciela Husbands discussed the situation with Dr. Hart Rochester and Dr. Katrinka Blazing. The patient's Brilinta, Coumadin, and Lovenox were held. He was started back on integrillin and heparin. Dr. Hart Rochester consulted and recommended  repair of R femoral artery thrombectomy and angioplasty. ABIs were 0.51 on R, 1.18 on L. The tentative plan was for vascular surgery on 08/15/12 after Brilinta washout. His integrillin was placed on hold on 08/15/12 in prep for surgery. However, upon arrival to the OR holding area, the patient began  experiencing chest pain. EKG showed anterior STEMI and he was taken emergently back to the cath lab.  Repeat cath showed 100% occlusion of the stented segment of the ostial LAD, responsible for his acute anterior STEMI. He underwent successful PTCA/thrombectomy of this site. The patient sustained 2 episodes of ventricular fibrillation requiring defibrillation occurring immediately after flow was re-established. Amiodarone 300mg  IV was given x 1. He was placed back on ASA and Brilinta, and integrillin was continued for 18 hours. The patient was also placed back on heparin. Coumadin was restarted. Distal aortogram 6/18 showed patent iliac and right common femoral arteries with no obvious filling defects. Repeat ABIs on 08/16/12 showed R ABI 0.39 (per notes when compared to the previous study on 08/12/2012, the right ABI is reduced from 0.51 to 0.39, however the previous ABI was calculated using the posterior tibial artery which was not insonated today. Being that the right dorsalis pedis artery was not insonated during prior evaluation, unable to determine if there is a significant change in ABI of the right lower extremity.) The vascular team at this point decided to hold off on surgery and recommended f/u with Dr. Hart Rochester in 4-6 weeks (at this point the patient was asymptomatic from leg viewpoint). They left a message on the patient's discharge instructions saying they will arrange this appointment.  Further med titration for ICM was limited by hypotension. Dr. Swaziland received news from the patient's father that the patient is actually a long-time alcoholic. Complete cessation of both this and tobacco was advised. The remaining days of his hospitalization were awaiting INR to become therapeutic. He also progressed his activity with cardiac rehab. Today INR is 2.26. Heparin was discontinued. Dr. Swaziland has seen and examined the patient today and feels he is stable for discharge. Dr. Swaziland has recommended to  continue DAPT with ASA/Brilinta for 1 month then stop ASA (stop date = 09/14/12). He also recommends repeat echo in 3 months which can be arranged at followup. He will also need repeat LFTs/lipids at discretion of primary cardiologist given statin initiation. Per discussion with pharmacy, continue Coumadin 10mg  daily for now and f/u INR on 08/24/12 - our [facility] clinic is unable to accomodate any new Coumadin appointments until mid-next week, so the patient had home health arranged for his INR check on Friday 08/24/12 (he did not think he had any way to get to [facility] so this was the best option).   His first post-hospital visit will be with Dr. Kirke Corin next week per transition of care protocol. The patient was advised to monitor carefully for any signs of bleeding and call ASAP if he develops any.  Discharge Vitals: Blood pressure 103/60, pulse 84, temperature 98.7 F (37.1 C), temperature source Oral, resp. rate 18, height 6\' 1"  (1.854 m), weight 151 lb 7.3 oz (68.7 kg), SpO2 100.00%.  Labs: Lab Results  Component Value Date   WBC 10.0 08/22/2012   HGB 12.1* 08/22/2012   HCT 35.8* 08/22/2012   MCV 88.6 08/22/2012   PLT 257 08/22/2012     Recent Labs Lab 08/16/12 0410  NA 136  K 4.1  CL 103  CO2 25  BUN 12  CREATININE 0.85  CALCIUM 9.2  GLUCOSE 90    Lab Results  Component Value Date   CHOL 211* 08/08/2012   HDL 80 08/08/2012   LDLCALC 114* 08/08/2012   TRIG 87 08/08/2012     Diagnostic Studies/Procedures   Two cardiac catheterizations this admission, please see full report and above for summary.   2D Echo 08/08/12 Left ventricle: Akinesis of the anterior wall and the anterior septum. Apex is akinetic or dyskinetic. Findings are most consistent with apical clot. The cavity size was normal. Wall thickness was increased in a pattern of mild LVH. Systolic function was moderately reduced. The estimated ejection fraction was in the range of 35% to 40%.  Ct  Pelvis W Contrast 08/12/2012   *RADIOLOGY REPORT*  Clinical Data:  Post cardiac cath with expanding the right groin hematoma.  CT PELVIS WITH CONTRAST  Technique:  Multidetector CT imaging of the pelvis was performed using the standard protocol following the bolus administration of intravenous contrast.  Contrast: 80mL OMNIPAQUE IOHEXOL 300 MG/ML  SOLN  Comparison:   None.  Findings:  There is contrast within the arterial system.  There is fluid and stranding in the right groin consistent with recent catheterization.  There is thrombus in the distal right common femoral artery and extending into the proximal superficial femoral artery.  The thrombus roughly measures 1.5 cm in length.  There is flow in the superficial femoral artery beyond the thrombus. Incidentally, there is an early takeoff of a femoral profunda branch above the thrombus.  The fluid collection around the right common femoral artery measures 2.1 x 2.2 cm.  Small punctate densities along the anterior aspect of the hematoma on image 36. The small punctate densities may represent contrast and bleeding but no evidence for a large pseudoaneurysm.  The iliac arteries are patent.  No gross abnormality to the prostate, seminal vesicles or urinary bladder.  There is no significant free fluid within the pelvis.  No acute bony abnormality.  IMPRESSION: Small fluid collection around the right common femoral artery, measuring 2.2 cm, is consistent with a small hematoma.  Punctate densities along the anterior of the hematoma could represent small areas of bleeding but there is no evidence for a large pseudoaneurysm.  There is focal thrombus within the distal common femoral artery and involving the proximal superficial femoral artery.  These results were called by telephone on 08/12/2012 at 07:05 a.m. to Dr. Brion Aliment, who verbally acknowledged these results.   Original Report Authenticated By: Richarda Overlie, M.D.   Portable Chest X-ray 1 View  08/07/2012   *RADIOLOGY  REPORT*  Clinical Data: Myocardial infarction.  PORTABLE CHEST - 1 VIEW  Comparison: None.  Findings: The heart size is normal.  No edema or pulmonary consolidation is identified.  No pleural fluid is seen.  IMPRESSION: No active disease.   Original Report Authenticated By: Irish Lack, M.D.    Discharge Medications     Medication List    STOP taking these medications       ibuprofen 200 MG tablet  Commonly known as:  ADVIL,MOTRIN      TAKE these medications       aspirin 81 MG tablet  Take 1 tablet (81 mg total) by mouth daily. Take through 09/14/12 then STOP.     atorvastatin 80 MG tablet  Commonly known as:  LIPITOR  Take 1 tablet (80 mg total) by mouth daily.     calcium carbonate 500 MG chewable tablet  Commonly known as:  TUMS - dosed in  mg elemental calcium  Chew 1-2 tablets by mouth 3 (three) times daily as needed for heartburn.     carvedilol 3.125 MG tablet  Commonly known as:  COREG  Take 1 tablet (3.125 mg total) by mouth 2 (two) times daily with a meal.     lisinopril 2.5 MG tablet  Commonly known as:  PRINIVIL,ZESTRIL  Take 1 tablet (2.5 mg total) by mouth daily.     nitroGLYCERIN 0.4 MG SL tablet  Commonly known as:  NITROSTAT  Place 1 tablet (0.4 mg total) under the tongue every 5 (five) minutes as needed for chest pain (up to 3 doses).     pantoprazole 40 MG tablet  Commonly known as:  PROTONIX  Take 1 tablet (40 mg total) by mouth daily.     Ticagrelor 90 MG Tabs tablet  Commonly known as:  BRILINTA  Take 1 tablet (90 mg total) by mouth 2 (two) times daily.     warfarin 10 MG tablet  Commonly known as:  COUMADIN  Take 1 tablet (10 mg total) by mouth as directed. Take 1 tablet by mouth today (08/22/12) and tomorrow (08/23/12) then have Coumadin level checked on 08/24/12 to find out what your next dose schedule should be.        Disposition   The patient will be discharged in stable condition to home. Discharge Orders   Future Appointments  Provider Department Dept Phone   08/30/2012 3:00 PM Iran Ouch, MD [facility] [place] [phone]   09/18/2012 2:30 PM [facility] [phone]   09/18/2012 3:00 PM [facility] [phone]   09/18/2012 3:30 PM Pryor Ochoa, MD [facility] [phone]   Future Orders Complete By Expires     Diet - low sodium heart healthy  As directed     Discharge instructions  As directed     Comments:      IF YOU NOTICE ANY DARK STOOLS, BLACK TARRY STOOLS, BLOOD IN YOUR STOOL/URINE OR ANY UNUSUAL BLEEDING, CALL YOUR DOCTOR AS SOON AS POSSIBLE.  One of your heart tests showed weakness of the heart muscle this admission. This may make you more susceptible to weight gain from fluid retention, which can lead to symptoms that we call heart failure.  For patients with this condition, we give them these special instructions:  1. Follow a low-salt diet and watch your fluid intake. In general, you should not be taking in more than 2 liters of fluid per day (no more than 8 glasses per day). Some patients are restricted to less than 1.5 liters of fluid per day (no more than 6 glasses per day). This includes sources of water in foods like soup, coffee, tea, milk, etc. 2. Weigh yourself on the same scale at same time of day and keep a log. 3. Call your doctor: (Anytime you feel any of the following symptoms)  - 3-4 pound weight gain in 1-2 days or 2 pounds overnight  - Shortness of breath, with or without a dry hacking cough  - Swelling in the hands, feet or stomach  - If you have to sleep on extra pillows at night in order to breathe   IT IS IMPORTANT TO LET YOUR DOCTOR KNOW EARLY ON IF YOU ARE HAVING SYMPTOMS SO WE CAN HELP YOU!    Increase activity slowly  As directed     Comments:      No driving  until cleared by your heart doctor. No lifting over 10 lbs for 4 weeks. No sexual  activity for 4 weeks. Keep procedure site clean & dry. If you notice increased pain, swelling, bleeding or pus, call/return!  You may shower, but no soaking baths/hot tubs/pools for 1 week.      Follow-up Information   Follow up with Josephina Gip, MD In 4 weeks. (office will arrange - sent)    Contact information:   [address] [place] [address] [phone]       Follow up with [facility]. (Please have home health nurse check your coumadin level on Friday 08/24/12 and call results to [facility])    Contact information:   [facility] [place] OFFICE [address] [place] [address] [phone]      Follow up with Lorine Bears, MD. (08/30/12 at 3pm)    Contact information:   [facility] OFFICE [address] [place] [address] [phone]         Duration of Discharge Encounter: Greater than 30 minutes including physician and PA time.  Signed, Dayna Dunn PA-C 08/22/2012, 2:15 PM

## 2012-08-23 NOTE — Telephone Encounter (Signed)
tcm #1 attempt lmtcb

## 2012-08-24 ENCOUNTER — Telehealth: Payer: Self-pay | Admitting: *Deleted

## 2012-08-24 ENCOUNTER — Ambulatory Visit (INDEPENDENT_AMBULATORY_CARE_PROVIDER_SITE_OTHER): Payer: PRIVATE HEALTH INSURANCE | Admitting: Cardiovascular Disease

## 2012-08-24 DIAGNOSIS — I2129 ST elevation (STEMI) myocardial infarction involving other sites: Secondary | ICD-10-CM

## 2012-08-24 DIAGNOSIS — I8290 Acute embolism and thrombosis of unspecified vein: Secondary | ICD-10-CM

## 2012-08-24 DIAGNOSIS — I749 Embolism and thrombosis of unspecified artery: Secondary | ICD-10-CM

## 2012-08-24 DIAGNOSIS — Z7901 Long term (current) use of anticoagulants: Secondary | ICD-10-CM | POA: Insufficient documentation

## 2012-08-24 NOTE — Telephone Encounter (Signed)
Result noted, advised we will call pt see anticoagulation note.  Will have pt hold Coumadin today, then start taking 10mg  daily except 5mg  on Mondays.  Recheck on Wednesday 08/29/12.

## 2012-08-24 NOTE — Telephone Encounter (Signed)
lmtcb  TCM # 2 attempt

## 2012-08-24 NOTE — Telephone Encounter (Signed)
See below

## 2012-08-24 NOTE — Telephone Encounter (Signed)
Calling INR 3.9

## 2012-08-29 ENCOUNTER — Ambulatory Visit (INDEPENDENT_AMBULATORY_CARE_PROVIDER_SITE_OTHER): Payer: PRIVATE HEALTH INSURANCE | Admitting: Cardiovascular Disease

## 2012-08-29 DIAGNOSIS — Z7901 Long term (current) use of anticoagulants: Secondary | ICD-10-CM

## 2012-08-29 DIAGNOSIS — I8290 Acute embolism and thrombosis of unspecified vein: Secondary | ICD-10-CM

## 2012-08-29 DIAGNOSIS — I749 Embolism and thrombosis of unspecified artery: Secondary | ICD-10-CM

## 2012-08-29 DIAGNOSIS — I2129 ST elevation (STEMI) myocardial infarction involving other sites: Secondary | ICD-10-CM

## 2012-08-30 ENCOUNTER — Ambulatory Visit (INDEPENDENT_AMBULATORY_CARE_PROVIDER_SITE_OTHER): Payer: Medicaid Other | Admitting: Cardiovascular Disease

## 2012-08-30 ENCOUNTER — Encounter: Payer: Self-pay | Admitting: Cardiovascular Disease

## 2012-08-30 VITALS — BP 102/68 | HR 86 | Ht 73.0 in | Wt 152.5 lb

## 2012-08-30 DIAGNOSIS — F17200 Nicotine dependence, unspecified, uncomplicated: Secondary | ICD-10-CM

## 2012-08-30 DIAGNOSIS — Z720 Tobacco use: Secondary | ICD-10-CM

## 2012-08-30 DIAGNOSIS — I2589 Other forms of chronic ischemic heart disease: Secondary | ICD-10-CM

## 2012-08-30 DIAGNOSIS — I25810 Atherosclerosis of coronary artery bypass graft(s) without angina pectoris: Secondary | ICD-10-CM

## 2012-08-30 DIAGNOSIS — E785 Hyperlipidemia, unspecified: Secondary | ICD-10-CM

## 2012-08-30 DIAGNOSIS — I255 Ischemic cardiomyopathy: Secondary | ICD-10-CM

## 2012-08-30 DIAGNOSIS — I2510 Atherosclerotic heart disease of native coronary artery without angina pectoris: Secondary | ICD-10-CM

## 2012-08-30 NOTE — Progress Notes (Signed)
HPI  This is a 57 year old [place] American male who is here today for a followup visit. He had a recent prolonged hospitalization at [facility]. He presented last month with chest pain and was found to have anterior ST elevation. He underwent emergent cardiac catheterization to the right femoral artery which showed ostial LAD plaque rupture with large thrombus associated with distal embolization. He underwent thrombectomy and 2 drug-eluting stent placement to the LAD. Echocardiogram showed an ejection fraction of 35-40%. However, there was apical akinesia is with a thrombus. He was started on anticoagulation with heparin and warfarin. He developed a right groin hematoma. CT scan showed evidence of thrombus in the right common femoral artery extending into the SFA. ABI was reduced. Antiplatelet medications were held with bridging with Integrilin in anticipation for right common femoral artery surgical repair. Shortly after stopping Integrilin, the patient developed recurrent anterior ST elevation MI with ventricular fibrillation. He underwent another catheterization via the right radial artery which showed stent thrombosis. This was treated with thrombectomy and balloon angioplasty.  Distal runoff showed no evidence of clot in the femoral artery.  He was stabilized and ultimately discharged home. Overall he has been doing reasonably well. However, he is extremely anxious. There is reported history of excessive alcohol use in the past. He is determined to quit smoking and is taking his medications regularly. He applied for Medicaid and disability.   No Known Allergies   Current Outpatient Prescriptions on File Prior to Visit  Medication Sig Dispense Refill  . aspirin 81 MG tablet Take 1 tablet (81 mg total) by mouth daily. Take through 09/14/12 then STOP.      Marland Kitchen atorvastatin (LIPITOR) 80 MG tablet Take 1 tablet (80 mg total) by mouth daily.  30 tablet  5  . calcium carbonate (TUMS - DOSED IN MG ELEMENTAL  CALCIUM) 500 MG chewable tablet Chew 1-2 tablets by mouth 3 (three) times daily as needed for heartburn.      . carvedilol (COREG) 3.125 MG tablet Take 1 tablet (3.125 mg total) by mouth 2 (two) times daily with a meal.  60 tablet  5  . lisinopril (PRINIVIL,ZESTRIL) 2.5 MG tablet Take 1 tablet (2.5 mg total) by mouth daily.  30 tablet  5  . nitroGLYCERIN (NITROSTAT) 0.4 MG SL tablet Place 1 tablet (0.4 mg total) under the tongue every 5 (five) minutes as needed for chest pain (up to 3 doses).  25 tablet  4  . pantoprazole (PROTONIX) 40 MG tablet Take 1 tablet (40 mg total) by mouth daily.  30 tablet  5  . Ticagrelor (BRILINTA) 90 MG TABS tablet Take 1 tablet (90 mg total) by mouth 2 (two) times daily.  60 tablet  5  . warfarin (COUMADIN) 10 MG tablet Take 1 tablet (10 mg total) by mouth as directed. Take 1 tablet by mouth today (08/22/12) and tomorrow (08/23/12) then have Coumadin level checked on 08/24/12 to find out what your next dose schedule should be.  30 tablet  2   No current facility-administered medications on file prior to visit.     Past Medical History  Diagnosis Date  . Tobacco use   . GERD (gastroesophageal reflux disease)   . Seizures   . Alcoholism   . Ischemic cardiomyopathy     a. EF 35-40% by echo 07/2012.  . LV (left ventricular) mural thrombus     a. By echo 07/2012 at time of STEMI.  . Groin hematoma     a. R  groin hematoma post-cath 07/2012, conservative management.  . Thrombus     a. R femoral, incidentally noted on CT after cath 07/2012 - Focal thrombus within the distal common femoral artery and involving the proximal superficial femoral artery. Initially planned for surgery but this was deferred after 2nd MI from being off integrillin, DAPT. Med rx, OP f/u vascular.  Marland Kitchen NSVT (nonsustained ventricular tachycardia)     a. Around time of STEMI 08/07/12.  . Ventricular fibrillation     a. In cath lab at time of STEMI #2 08/15/12.  Marland Kitchen HTN (hypertension)     a. Soft BP  07/2012 limiting med titration.  . Coronary artery disease     a. 08/07/12: anterior STEMI s/p DES x 2 to ostial LAD, thrombus extraction of mid LAD - c/b R groin hematoma/focal R femoral thrombus. b.  08/15/12: recurrent acute anterior STEMI with occlusion of LAD stent, (following cessation of integrellin prior to scheduled vascular surgery 08/15/12 for right femoral thrombectomy - later deferred), s/p PTCA/thrombectomy of LAD.   Marland Kitchen Hyperlipidemia      Past Surgical History  Procedure Laterality Date  . None    . Tonsillectomy    . Cardiac catheterization    . Coronary angioplasty       History reviewed. No pertinent family history.   History   Social History  . Marital Status: Divorced    Spouse Name: N/A    Number of Children: N/A  . Years of Education: N/A   Occupational History  . Unemployed    Social History Main Topics  . Smoking status: Current Every Day Smoker -- 0.50 packs/day for 40 years  . Smokeless tobacco: Not on file  . Alcohol Use: 3.6 oz/week    6 Cans of beer per week  . Drug Use: No  . Sexually Active: Not on file   Other Topics Concern  . Not on file   Social History Narrative   Patient lives alone in a trailer. There is no family history of premature coronary artery disease in either parent or any other siblings.     PHYSICAL EXAM   BP 102/68  Pulse 86  Ht 6\' 1"  (1.854 m)  Wt 152 lb 8 oz (69.174 kg)  BMI 20.12 kg/m2 Constitutional: He is oriented to person, place, and time. He appears well-developed and well-nourished. No distress.  HENT: No nasal discharge.  Head: Normocephalic and atraumatic.  Eyes: Pupils are equal and round. Right eye exhibits no discharge. Left eye exhibits no discharge.  Neck: Normal range of motion. Neck supple. No JVD present. No thyromegaly present.  Cardiovascular: Normal rate, regular rhythm, normal heart sounds and. Exam reveals no gallop and no friction rub. No murmur heard.  Pulmonary/Chest: Effort normal and  breath sounds normal. No stridor. No respiratory distress. He has no wheezes. He has no rales. He exhibits no tenderness.  Abdominal: Soft. Bowel sounds are normal. He exhibits no distension. There is no tenderness. There is no rebound and no guarding.  Musculoskeletal: Normal range of motion. He exhibits no edema and no tenderness.  Neurological: He is alert and oriented to person, place, and time. Coordination normal.  Skin: Skin is warm and dry. No rash noted. He is not diaphoretic. No erythema. No pallor.  Psychiatric: He has a normal mood and affect. His behavior is normal. Judgment and thought content normal.  Right groin: There is a small scar tissue but no bruit or tenderness. Femoral pulse is palpable. Distal pulses on the right  side are weak.  Right radial pulse is diminished with no evidence of hematoma. Left radial pulse is normal     EKG: Sinus  Rhythm  -Extensive anterior-lateral injury.   -  T-abnormality  - Anterior ischemia.   ABNORMAL    ASSESSMENT AND PLAN

## 2012-08-30 NOTE — Patient Instructions (Addendum)
Start Nicotine Patch. Do not smoke.  Continue other medications.  Follow up in 1 month.

## 2012-08-30 NOTE — Assessment & Plan Note (Signed)
Continue treatment with high dose atorvastatin. He will need a followup lipid and liver profile in 4-6 weeks.

## 2012-08-30 NOTE — Assessment & Plan Note (Signed)
He wants to quit smoking and I gave him a prescription for nicotine patch.

## 2012-08-30 NOTE — Assessment & Plan Note (Signed)
The patient is doing reasonably well after a recent anterior ST elevation MI complicated by right groin hematoma and stent thrombosis. He is to continue dual antiplatelet therapy for at least one year. I discussed with him the importance of taking his medications regularly and he is trying to obtain Medicaid. He is clearly disabled based on his recent cardiac events. I provided him with samples for Brilinta.  I had a prolonged discussion with him about the importance of lifestyle changes.

## 2012-08-30 NOTE — Assessment & Plan Note (Signed)
Ejection fraction was 35-40% with apical thrombus. Continue anticoagulation with warfarin. Continue small dose carvedilol and lisinopril. These cannot be increased due to relatively low blood pressure. Repeat echocardiogram in 3 months.

## 2012-09-03 ENCOUNTER — Ambulatory Visit (INDEPENDENT_AMBULATORY_CARE_PROVIDER_SITE_OTHER): Payer: PRIVATE HEALTH INSURANCE | Admitting: Cardiovascular Disease

## 2012-09-03 ENCOUNTER — Other Ambulatory Visit: Payer: Self-pay | Admitting: Cardiovascular Disease

## 2012-09-03 DIAGNOSIS — Z7901 Long term (current) use of anticoagulants: Secondary | ICD-10-CM

## 2012-09-03 DIAGNOSIS — I749 Embolism and thrombosis of unspecified artery: Secondary | ICD-10-CM

## 2012-09-03 DIAGNOSIS — I2129 ST elevation (STEMI) myocardial infarction involving other sites: Secondary | ICD-10-CM

## 2012-09-03 DIAGNOSIS — I8290 Acute embolism and thrombosis of unspecified vein: Secondary | ICD-10-CM

## 2012-09-03 LAB — PROTIME-INR
INR: 4.1
Prothrombin Time: 38.3 secs — ABNORMAL HIGH (ref 11.5–14.7)

## 2012-09-03 LAB — LAB REPORT - SCANNED: INR: 4.1

## 2012-09-05 ENCOUNTER — Telehealth: Payer: Self-pay

## 2012-09-05 NOTE — Telephone Encounter (Signed)
Request from Disability Determination Services, sent to [employer] on 09/06/2012 .  

## 2012-09-11 ENCOUNTER — Ambulatory Visit (INDEPENDENT_AMBULATORY_CARE_PROVIDER_SITE_OTHER): Payer: PRIVATE HEALTH INSURANCE | Admitting: Cardiovascular Disease

## 2012-09-11 DIAGNOSIS — I749 Embolism and thrombosis of unspecified artery: Secondary | ICD-10-CM

## 2012-09-11 DIAGNOSIS — Z7901 Long term (current) use of anticoagulants: Secondary | ICD-10-CM

## 2012-09-11 DIAGNOSIS — I2129 ST elevation (STEMI) myocardial infarction involving other sites: Secondary | ICD-10-CM

## 2012-09-11 DIAGNOSIS — I8290 Acute embolism and thrombosis of unspecified vein: Secondary | ICD-10-CM

## 2012-09-11 LAB — POCT INR: INR: 2.8

## 2012-09-11 MED ORDER — WARFARIN SODIUM 10 MG PO TABS: ORAL_TABLET | ORAL | Status: DC

## 2012-09-11 MED ORDER — LISINOPRIL 2.5 MG PO TABS: 2.5000 mg | ORAL_TABLET | Freq: Every day | ORAL | Status: DC

## 2012-09-17 ENCOUNTER — Encounter: Payer: Self-pay | Admitting: Vascular Surgery

## 2012-09-18 ENCOUNTER — Ambulatory Visit (INDEPENDENT_AMBULATORY_CARE_PROVIDER_SITE_OTHER): Payer: PRIVATE HEALTH INSURANCE | Admitting: Cardiology

## 2012-09-18 ENCOUNTER — Encounter (INDEPENDENT_AMBULATORY_CARE_PROVIDER_SITE_OTHER): Payer: Medicaid Other | Admitting: *Deleted

## 2012-09-18 ENCOUNTER — Encounter: Payer: Self-pay | Admitting: Vascular Surgery

## 2012-09-18 ENCOUNTER — Ambulatory Visit (INDEPENDENT_AMBULATORY_CARE_PROVIDER_SITE_OTHER): Payer: Medicaid Other | Admitting: Vascular Surgery

## 2012-09-18 VITALS — BP 116/67 | HR 48 | Ht 73.0 in | Wt 151.7 lb

## 2012-09-18 DIAGNOSIS — I739 Peripheral vascular disease, unspecified: Secondary | ICD-10-CM

## 2012-09-18 DIAGNOSIS — Z48812 Encounter for surgical aftercare following surgery on the circulatory system: Secondary | ICD-10-CM

## 2012-09-18 DIAGNOSIS — I8290 Acute embolism and thrombosis of unspecified vein: Secondary | ICD-10-CM

## 2012-09-18 DIAGNOSIS — Z7901 Long term (current) use of anticoagulants: Secondary | ICD-10-CM

## 2012-09-18 DIAGNOSIS — I749 Embolism and thrombosis of unspecified artery: Secondary | ICD-10-CM

## 2012-09-18 DIAGNOSIS — I2129 ST elevation (STEMI) myocardial infarction involving other sites: Secondary | ICD-10-CM

## 2012-09-18 LAB — POCT INR: INR: 3.8

## 2012-09-18 NOTE — Progress Notes (Signed)
Subjective:     Patient ID: Edward Cooper, male   DOB: 03/05/1955, 57 y.o.   MRN: [id number]  HPI this 57 year old male returns for followup regarding possible thrombus in the right common femoral artery. This patient had an acute myocardial infarction in June and had emergency stenting. Initially he was found to have thrombus in a large filling defect in the right common femoral artery as well as a hematoma. Several days later plans were made to explore the right femoral artery that the patient had rethrombosis of his cardiac stent and was taken urgently to the cath lab for thrombolyzes. He now returns for followup regarding his right lower extremity. He denies any claudication symptoms. He has been stable from a cardiac standpoint.   Past Medical History  Diagnosis Date  . Tobacco use   . GERD (gastroesophageal reflux disease)   . Seizures   . Alcoholism   . Ischemic cardiomyopathy     a. EF 35-40% by echo 07/2012.  . LV (left ventricular) mural thrombus     a. By echo 07/2012 at time of STEMI.  . Groin hematoma     a. R groin hematoma post-cath 07/2012, conservative management.  . Thrombus     a. R femoral, incidentally noted on CT after cath 07/2012 - Focal thrombus within the distal common femoral artery and involving the proximal superficial femoral artery. Initially planned for surgery but this was deferred after 2nd MI from being off integrillin, DAPT. Med rx, OP f/u vascular.  Marland Kitchen NSVT (nonsustained ventricular tachycardia)     a. Around time of STEMI 08/07/12.  . Ventricular fibrillation     a. In cath lab at time of STEMI #2 08/15/12.  Marland Kitchen HTN (hypertension)     a. Soft BP 07/2012 limiting med titration.  . Coronary artery disease     a. 08/07/12: anterior STEMI s/p DES x 2 to ostial LAD, thrombus extraction of mid LAD - c/b R groin hematoma/focal R femoral thrombus. b.  08/15/12: recurrent acute anterior STEMI with occlusion of LAD stent, (following cessation of integrellin prior to scheduled  vascular surgery 08/15/12 for right femoral thrombectomy - later deferred), s/p PTCA/thrombectomy of LAD.   Marland Kitchen Hyperlipidemia     History  Substance Use Topics  . Smoking status: Current Every Day Smoker -- 0.50 packs/day for 40 years  . Smokeless tobacco: Not on file     Comment: pt states that he is trying to quit  . Alcohol Use: 3.6 oz/week    6 Cans of beer per week    History reviewed. No pertinent family history.  No Known Allergies  Current outpatient prescriptions:atorvastatin (LIPITOR) 80 MG tablet, Take 1 tablet (80 mg total) by mouth daily., Disp: 30 tablet, Rfl: 5;  calcium carbonate (TUMS - DOSED IN MG ELEMENTAL CALCIUM) 500 MG chewable tablet, Chew 1-2 tablets by mouth 3 (three) times daily as needed for heartburn., Disp: , Rfl: ;  carvedilol (COREG) 3.125 MG tablet, Take 1 tablet (3.125 mg total) by mouth 2 (two) times daily with a meal., Disp: 60 tablet, Rfl: 5 lisinopril (PRINIVIL,ZESTRIL) 2.5 MG tablet, Take 1 tablet (2.5 mg total) by mouth daily., Disp: 30 tablet, Rfl: 5;  nitroGLYCERIN (NITROSTAT) 0.4 MG SL tablet, Place 1 tablet (0.4 mg total) under the tongue every 5 (five) minutes as needed for chest pain (up to 3 doses)., Disp: 25 tablet, Rfl: 4;  pantoprazole (PROTONIX) 40 MG tablet, Take 1 tablet (40 mg total) by mouth daily., Disp: 30 tablet, Rfl:  5 Ticagrelor (BRILINTA) 90 MG TABS tablet, Take 1 tablet (90 mg total) by mouth 2 (two) times daily., Disp: 60 tablet, Rfl: 5;  warfarin (COUMADIN) 10 MG tablet, Take as directed by Anticoagulation clinic, Disp: 30 tablet, Rfl: 2  BP 116/67  Pulse 48  Ht 6\' 1"  (1.854 m)  Wt 151 lb 11.2 oz (68.811 kg)  BMI 20.02 kg/m2  SpO2 100%  Body mass index is 20.02 kg/(m^2).           Review of Systems denies active chest pain, dyspnea on exertion, PND, orthopnea, hemoptysis     Objective:   Physical Exam blood pressure 116/67 pulse 48 respirations 16 General thin male in no apparent stress alert and oriented  x3 Lungs no rhonchi or wheezing Lower strandings with 3+ femoral popliteal and 2+ dorsalis pedis pulses palpable bilaterally. No pulsatile mass noted in either inguinal area.  Today I ordered ABIs bilaterally as well as duplex scan of the right leg. ABIs are normal with triphasic flow bilaterally. Duplex scan reveals no evidence of any thrombus remaining in the common femoral artery with normal-appearing vessels     Assessment:     No evidence of thrombus in right common femoral artery at this time with normal ABIs    Plan:     No treatment indicated-patient to continue on Coumadin and Brilinta per cardiology Return to see me on when necessary basis

## 2012-09-28 ENCOUNTER — Ambulatory Visit (INDEPENDENT_AMBULATORY_CARE_PROVIDER_SITE_OTHER): Payer: PRIVATE HEALTH INSURANCE | Admitting: Cardiovascular Disease

## 2012-09-28 ENCOUNTER — Telehealth: Payer: Self-pay | Admitting: *Deleted

## 2012-09-28 DIAGNOSIS — I8290 Acute embolism and thrombosis of unspecified vein: Secondary | ICD-10-CM

## 2012-09-28 DIAGNOSIS — I2129 ST elevation (STEMI) myocardial infarction involving other sites: Secondary | ICD-10-CM

## 2012-09-28 DIAGNOSIS — I749 Embolism and thrombosis of unspecified artery: Secondary | ICD-10-CM

## 2012-09-28 DIAGNOSIS — Z7901 Long term (current) use of anticoagulants: Secondary | ICD-10-CM

## 2012-09-28 LAB — POCT INR: INR: 1.9

## 2012-09-28 NOTE — Telephone Encounter (Signed)
Results addressed see anticoagulation note in Epic.  HH discharging pt, made f/u appt in [facility] Clinic.

## 2012-09-28 NOTE — Telephone Encounter (Signed)
Home health nurse called in INR 1.9

## 2012-10-05 ENCOUNTER — Other Ambulatory Visit: Payer: Self-pay

## 2012-10-05 ENCOUNTER — Ambulatory Visit (INDEPENDENT_AMBULATORY_CARE_PROVIDER_SITE_OTHER): Payer: Medicaid Other | Admitting: Cardiovascular Disease

## 2012-10-05 ENCOUNTER — Encounter: Payer: Self-pay | Admitting: Physician Assistant

## 2012-10-05 ENCOUNTER — Ambulatory Visit (INDEPENDENT_AMBULATORY_CARE_PROVIDER_SITE_OTHER): Payer: Medicaid Other

## 2012-10-05 ENCOUNTER — Encounter: Payer: Self-pay | Admitting: Cardiovascular Disease

## 2012-10-05 VITALS — BP 102/82 | HR 85 | Ht 73.0 in | Wt 149.8 lb

## 2012-10-05 DIAGNOSIS — Z720 Tobacco use: Secondary | ICD-10-CM

## 2012-10-05 DIAGNOSIS — I8290 Acute embolism and thrombosis of unspecified vein: Secondary | ICD-10-CM

## 2012-10-05 DIAGNOSIS — Z7901 Long term (current) use of anticoagulants: Secondary | ICD-10-CM

## 2012-10-05 DIAGNOSIS — F17200 Nicotine dependence, unspecified, uncomplicated: Secondary | ICD-10-CM

## 2012-10-05 DIAGNOSIS — I2589 Other forms of chronic ischemic heart disease: Secondary | ICD-10-CM

## 2012-10-05 DIAGNOSIS — I749 Embolism and thrombosis of unspecified artery: Secondary | ICD-10-CM

## 2012-10-05 DIAGNOSIS — I255 Ischemic cardiomyopathy: Secondary | ICD-10-CM

## 2012-10-05 DIAGNOSIS — I2129 ST elevation (STEMI) myocardial infarction involving other sites: Secondary | ICD-10-CM

## 2012-10-05 DIAGNOSIS — Z5181 Encounter for therapeutic drug level monitoring: Secondary | ICD-10-CM

## 2012-10-05 DIAGNOSIS — R079 Chest pain, unspecified: Secondary | ICD-10-CM

## 2012-10-05 DIAGNOSIS — I2510 Atherosclerotic heart disease of native coronary artery without angina pectoris: Secondary | ICD-10-CM

## 2012-10-05 DIAGNOSIS — F064 Anxiety disorder due to known physiological condition: Secondary | ICD-10-CM

## 2012-10-05 LAB — PROTIME-INR: INR: 5.2 — AB (ref <=1.1)

## 2012-10-05 MED ORDER — SERTRALINE HCL 50 MG PO TABS: 50.0000 mg | ORAL_TABLET | Freq: Every day | ORAL | Status: DC

## 2012-10-05 NOTE — Progress Notes (Signed)
Received letter from [employer] to fax a copy of pt's address as the patient form was illegible when originally faxed to them. I faxed this info to them as requested. Dayna Dunn PA-C

## 2012-10-05 NOTE — Patient Instructions (Addendum)
Start Sertraline 50 mg once daily.  Continue other medications.  Refer to social services to assist with medications, Medicaid application.   Follow up in 3 months.

## 2012-10-05 NOTE — Assessment & Plan Note (Signed)
Ejection fraction was 35-40% with apical thrombus. Continue anticoagulation with warfarin. Continue small dose carvedilol and lisinopril. These cannot be increased due to relatively low blood pressure. His ejection fraction might actually be lower than this given that he had a second event after the echo was done.

## 2012-10-05 NOTE — Progress Notes (Signed)
HPI  This is a 57 year old [place] American male who is here today for a followup visit. He had a prolonged hospitalization at [facility] in June. He presented  with chest pain and was found to have anterior ST elevation. He underwent emergent cardiac catheterization to the right femoral artery which showed ostial LAD plaque rupture with large thrombus associated with distal embolization. He underwent thrombectomy and 2 drug-eluting stent placement to the LAD. Echocardiogram showed an ejection fraction of 35-40%. However, there was apical akinesia is with a thrombus. He was started on anticoagulation with heparin and warfarin. He developed a right groin hematoma. CT scan showed evidence of thrombus in the right common femoral artery extending into the SFA. ABI was reduced. Antiplatelet medications were held with bridging with Integrilin in anticipation for right common femoral artery surgical repair. Shortly after stopping Integrilin, the patient developed recurrent anterior ST elevation MI with ventricular fibrillation. He underwent another catheterization via the right radial artery which showed stent thrombosis. This was treated with thrombectomy and balloon angioplasty.  Distal runoff showed no evidence of clot in the femoral artery.  He was stabilized and ultimately discharged home.  Since hospital discharge, he has been suffering from severe anxiety and flashbacks related to his CPR and defibrillation. He remembers all the details and has nightmares on a regular basis with severe anxiety. He complains of being depressed. However, he denies any suicidal or homicidal ideation. He is having hard time affording his medications. His Medicaid and SSI applications are both pending. He denies chest pain or significant dyspnea. However, he is deconditioned with fatigue. He is also afraid to do anything that might trigger another cardiac event.   No Known Allergies   Current Outpatient Prescriptions on File  Prior to Visit  Medication Sig Dispense Refill  . atorvastatin (LIPITOR) 80 MG tablet Take 1 tablet (80 mg total) by mouth daily.  30 tablet  5  . carvedilol (COREG) 3.125 MG tablet Take 1 tablet (3.125 mg total) by mouth 2 (two) times daily with a meal.  60 tablet  5  . lisinopril (PRINIVIL,ZESTRIL) 2.5 MG tablet Take 1 tablet (2.5 mg total) by mouth daily.  30 tablet  5  . nitroGLYCERIN (NITROSTAT) 0.4 MG SL tablet Place 1 tablet (0.4 mg total) under the tongue every 5 (five) minutes as needed for chest pain (up to 3 doses).  25 tablet  4  . pantoprazole (PROTONIX) 40 MG tablet Take 1 tablet (40 mg total) by mouth daily.  30 tablet  5  . Ticagrelor (BRILINTA) 90 MG TABS tablet Take 1 tablet (90 mg total) by mouth 2 (two) times daily.  60 tablet  5  . warfarin (COUMADIN) 10 MG tablet Take as directed by Anticoagulation clinic  30 tablet  2   No current facility-administered medications on file prior to visit.     Past Medical History  Diagnosis Date  . Tobacco use   . GERD (gastroesophageal reflux disease)   . Seizures   . Alcoholism   . Ischemic cardiomyopathy     a. EF 35-40% by echo 07/2012.  . LV (left ventricular) mural thrombus     a. By echo 07/2012 at time of STEMI.  . Groin hematoma     a. R groin hematoma post-cath 07/2012, conservative management.  . Thrombus     a. R femoral, incidentally noted on CT after cath 07/2012 - Focal thrombus within the distal common femoral artery and involving the proximal superficial femoral artery.  Initially planned for surgery but this was deferred after 2nd MI from being off integrillin, DAPT. Med rx, OP f/u vascular.  Marland Kitchen NSVT (nonsustained ventricular tachycardia)     a. Around time of STEMI 08/07/12.  . Ventricular fibrillation     a. In cath lab at time of STEMI #2 08/15/12.  Marland Kitchen HTN (hypertension)     a. Soft BP 07/2012 limiting med titration.  . Coronary artery disease     a. 08/07/12: anterior STEMI s/p DES x 2 to ostial LAD, thrombus  extraction of mid LAD - c/b R groin hematoma/focal R femoral thrombus. b.  08/15/12: recurrent acute anterior STEMI with occlusion of LAD stent, (following cessation of integrellin prior to scheduled vascular surgery 08/15/12 for right femoral thrombectomy - later deferred), s/p PTCA/thrombectomy of LAD.   Marland Kitchen Hyperlipidemia      Past Surgical History  Procedure Laterality Date  . None    . Tonsillectomy    . Cardiac catheterization    . Coronary angioplasty       Family History  Problem Relation Age of Onset  . Family history unknown: Yes     History   Social History  . Marital Status: Divorced    Spouse Name: N/A    Number of Children: N/A  . Years of Education: N/A   Occupational History  . Unemployed    Social History Main Topics  . Smoking status: Current Every Day Smoker -- 0.50 packs/day for 40 years    Types: Cigarettes  . Smokeless tobacco: Not on file     Comment: pt states that he is trying to quit  . Alcohol Use: 3.6 oz/week    6 Cans of beer per week  . Drug Use: No  . Sexually Active: Not on file   Other Topics Concern  . Not on file   Social History Narrative   Patient lives alone in a trailer. There is no family history of premature coronary artery disease in either parent or any other siblings.     PHYSICAL EXAM   BP 102/82  Pulse 85  Ht 6\' 1"  (1.854 m)  Wt 149 lb 12 oz (67.926 kg)  BMI 19.76 kg/m2 Constitutional: He is oriented to person, place, and time. He appears well-developed and well-nourished. No distress.  HENT: No nasal discharge.  Head: Normocephalic and atraumatic.  Eyes: Pupils are equal and round. Right eye exhibits no discharge. Left eye exhibits no discharge.  Neck: Normal range of motion. Neck supple. No JVD present. No thyromegaly present.  Cardiovascular: Normal rate, regular rhythm, normal heart sounds and. Exam reveals no gallop and no friction rub. No murmur heard.  Pulmonary/Chest: Effort normal and breath sounds  normal. No stridor. No respiratory distress. He has no wheezes. He has no rales. He exhibits no tenderness.  Abdominal: Soft. Bowel sounds are normal. He exhibits no distension. There is no tenderness. There is no rebound and no guarding.  Musculoskeletal: Normal range of motion. He exhibits no edema and no tenderness.  Neurological: He is alert and oriented to person, place, and time. Coordination normal.  Skin: Skin is warm and dry. No rash noted. He is not diaphoretic. No erythema. No pallor.  Psychiatric: He has depressed mood and very anxious . His behavior is normal. Judgment and thought content normal.     EKG: Sinus  Rhythm  -With sinus arrhythmia -  Negative T-waves  -Possible  Anterior  ischemia.   ABNORMAL     ASSESSMENT AND PLAN

## 2012-10-05 NOTE — Assessment & Plan Note (Signed)
He is having symptoms suggestive of PTSD and severe anxiety. He currently does not have a primary care physician. I had a prolonged discussion with him about his disease process and expectations. I started him on sertraline 50 mg once daily. [facility]  His Medicaid application is bending. Will get social worker involved . He is having hard time affording his medications.

## 2012-10-05 NOTE — Assessment & Plan Note (Signed)
He has no symptoms suggestive of angina. Continue medical therapy and treatment with Brilinta without aspirin given that he is on anticoagulation.  The patient is disabled from a cardiac standpoint and not able to return to work at the point.

## 2012-10-05 NOTE — Assessment & Plan Note (Signed)
He is trying to quit smoking due to severe anxiety and has not been able to afford nicotine patch.

## 2012-10-06 LAB — PT (THROMBOREL-S): PT (Thromborel-S): 64 s — ABNORMAL HIGH (ref 10.7–13.1)

## 2012-10-06 LAB — INR (THROMBOREL-S): INR (Thromborel-S): 5.2 — ABNORMAL HIGH (ref 0.8–1.2)

## 2012-10-06 LAB — PROTIME-INR

## 2012-10-10 ENCOUNTER — Telehealth: Payer: Self-pay

## 2012-10-10 NOTE — Telephone Encounter (Signed)
Pt called and cx coumadin appt for this Friday, 10/12/2012, states he does not have the money for transportation to and from this appt. Is not sure when he will be able to r/s.

## 2012-10-10 NOTE — Telephone Encounter (Signed)
Pt does not have transportation and has difficulty making Coumadin follow-up appts.  Is pt a candidate for newer anticoagulant agent?  Pt is high risk on Coumadin for adverse effects without proper follow-up and compliance.  Please advise.  Thanks

## 2012-10-10 NOTE — Telephone Encounter (Signed)
The new agents are not approved for LV thrombus. If he is not able to follow up regularly, then I have no choice but to recommend stopping Warfarin and starting Aspirin 81 mg once daily. He has to understand the risk with this including possibility of stroke. However, without checking INR regularly, the risk of complications is higher.

## 2012-10-11 NOTE — Telephone Encounter (Signed)
Advised pt of Dr Jari Sportsman recommendations to d/c Coumadin and start ASA 81mg  if unable to have INR checked regularly.  Pt aware of increased risk of stroke in discontinuing Coumadin and switching to ASA, but also made pt aware of greater risk of adverse effects of being on Coumadin and not being monitored appropriately.  Pt states he wants to stay on Coumadin and will try to find transportation to appt on Friday.

## 2012-10-15 ENCOUNTER — Ambulatory Visit (INDEPENDENT_AMBULATORY_CARE_PROVIDER_SITE_OTHER): Payer: Medicaid Other

## 2012-10-15 ENCOUNTER — Telehealth: Payer: Self-pay | Admitting: *Deleted

## 2012-10-15 DIAGNOSIS — I2129 ST elevation (STEMI) myocardial infarction involving other sites: Secondary | ICD-10-CM

## 2012-10-15 DIAGNOSIS — Z5181 Encounter for therapeutic drug level monitoring: Secondary | ICD-10-CM

## 2012-10-15 DIAGNOSIS — I749 Embolism and thrombosis of unspecified artery: Secondary | ICD-10-CM

## 2012-10-15 DIAGNOSIS — Z7901 Long term (current) use of anticoagulants: Secondary | ICD-10-CM

## 2012-10-15 DIAGNOSIS — I8290 Acute embolism and thrombosis of unspecified vein: Secondary | ICD-10-CM

## 2012-10-15 NOTE — Telephone Encounter (Signed)
Request from Disabiltiy determination Services , sent to [employer] on 10/15/12.

## 2012-10-19 ENCOUNTER — Telehealth: Payer: Self-pay | Admitting: *Deleted

## 2012-10-19 NOTE — Telephone Encounter (Signed)
Spoke w/ pt.  Informed him that he takes Brilinta 90mg  1 tab in the am and 1 in the pm to equal a total of 180mg  daily.  He said that [facility] 180mg  and he was confused.  Clarified that Brilinta only comes in a 90 mg tab and for him to take it twice a day.  Pt verbalizes understanding.

## 2012-10-19 NOTE — Telephone Encounter (Signed)
Patient went to the [facility] prescription management to get more meds and Dr. Conchita Paris changed his Berilinta to 180 mg. Please advise the patient as he is confused on the mg he should be taking

## 2012-10-26 ENCOUNTER — Ambulatory Visit (INDEPENDENT_AMBULATORY_CARE_PROVIDER_SITE_OTHER): Payer: Medicaid Other

## 2012-10-26 DIAGNOSIS — Z5181 Encounter for therapeutic drug level monitoring: Secondary | ICD-10-CM

## 2012-10-26 DIAGNOSIS — I2129 ST elevation (STEMI) myocardial infarction involving other sites: Secondary | ICD-10-CM

## 2012-10-26 DIAGNOSIS — I749 Embolism and thrombosis of unspecified artery: Secondary | ICD-10-CM

## 2012-10-26 DIAGNOSIS — I8290 Acute embolism and thrombosis of unspecified vein: Secondary | ICD-10-CM

## 2012-10-26 DIAGNOSIS — Z7901 Long term (current) use of anticoagulants: Secondary | ICD-10-CM

## 2012-10-26 LAB — POCT INR: INR: 3.9

## 2012-11-01 ENCOUNTER — Telehealth: Payer: Self-pay

## 2012-11-01 NOTE — Telephone Encounter (Signed)
Request from Disability Determination Services, sent to [employer] on 11/01/2012 .

## 2012-11-09 ENCOUNTER — Telehealth: Payer: Self-pay

## 2012-11-09 DIAGNOSIS — Z0279 Encounter for issue of other medical certificate: Secondary | ICD-10-CM

## 2012-11-09 NOTE — Telephone Encounter (Signed)
Request from Disability Determination Services , sent to [employer] on 11/09/2012 .

## 2012-11-12 ENCOUNTER — Ambulatory Visit (INDEPENDENT_AMBULATORY_CARE_PROVIDER_SITE_OTHER): Payer: Medicaid Other

## 2012-11-12 DIAGNOSIS — I749 Embolism and thrombosis of unspecified artery: Secondary | ICD-10-CM

## 2012-11-12 DIAGNOSIS — Z5181 Encounter for therapeutic drug level monitoring: Secondary | ICD-10-CM

## 2012-11-12 DIAGNOSIS — I8290 Acute embolism and thrombosis of unspecified vein: Secondary | ICD-10-CM

## 2012-11-12 DIAGNOSIS — I2129 ST elevation (STEMI) myocardial infarction involving other sites: Secondary | ICD-10-CM

## 2012-11-12 DIAGNOSIS — Z7901 Long term (current) use of anticoagulants: Secondary | ICD-10-CM

## 2012-11-12 LAB — POCT INR: INR: 2.3

## 2012-11-19 ENCOUNTER — Telehealth: Payer: Self-pay

## 2012-11-19 NOTE — Telephone Encounter (Signed)
2nd  Request from Disability Determination Services , sent to [employer] on 11/19/2012.

## 2012-11-30 ENCOUNTER — Telehealth: Payer: Self-pay

## 2012-11-30 NOTE — Telephone Encounter (Signed)
Request from Disability Determination Services , sent to [employer] on 11/30/2012.

## 2012-12-12 ENCOUNTER — Telehealth: Payer: Self-pay | Admitting: *Deleted

## 2012-12-12 NOTE — Telephone Encounter (Signed)
[facility] for at least a month.

## 2012-12-13 ENCOUNTER — Ambulatory Visit: Payer: Self-pay | Admitting: Cardiovascular Disease

## 2012-12-13 DIAGNOSIS — I2129 ST elevation (STEMI) myocardial infarction involving other sites: Secondary | ICD-10-CM

## 2012-12-13 DIAGNOSIS — I8290 Acute embolism and thrombosis of unspecified vein: Secondary | ICD-10-CM

## 2012-12-13 DIAGNOSIS — Z7901 Long term (current) use of anticoagulants: Secondary | ICD-10-CM

## 2012-12-13 NOTE — Telephone Encounter (Signed)
Advised pt to call us if we need to resume monitoring Coumadin.  At present following at [facility] clinic.

## 2013-01-07 ENCOUNTER — Encounter: Payer: Self-pay | Admitting: *Deleted

## 2013-01-07 ENCOUNTER — Ambulatory Visit: Payer: Self-pay | Admitting: Cardiovascular Disease

## 2013-08-28 NOTE — Telephone Encounter (Signed)
This encounter was created in error - please disregard.

## 2014-02-06 ENCOUNTER — Encounter (HOSPITAL_COMMUNITY): Payer: Self-pay | Admitting: Cardiology

## 2018-07-02 ENCOUNTER — Inpatient Hospital Stay (HOSPITAL_COMMUNITY)
Admission: AD | Admit: 2018-07-02 | Discharge: 2018-07-30 | DRG: 064 | Disposition: E | Payer: Medicare Other | Source: Other Acute Inpatient Hospital | Attending: Pulmonary Disease | Admitting: Pulmonary Disease

## 2018-07-02 ENCOUNTER — Emergency Department: Payer: Self-pay

## 2018-07-02 ENCOUNTER — Emergency Department
Admission: EM | Admit: 2018-07-02 | Discharge: 2018-07-02 | Disposition: A | Discharge status: Other Acute Inpatient Hospital | Payer: Self-pay | Attending: Emergency Medicine | Admitting: Emergency Medicine

## 2018-07-02 ENCOUNTER — Other Ambulatory Visit: Payer: Self-pay

## 2018-07-02 ENCOUNTER — Inpatient Hospital Stay (HOSPITAL_COMMUNITY): Payer: Medicare Other

## 2018-07-02 DIAGNOSIS — J95851 Ventilator associated pneumonia: Secondary | ICD-10-CM | POA: Diagnosis not present

## 2018-07-02 DIAGNOSIS — I63511 Cerebral infarction due to unspecified occlusion or stenosis of right middle cerebral artery: Principal | ICD-10-CM | POA: Diagnosis present

## 2018-07-02 DIAGNOSIS — E46 Unspecified protein-calorie malnutrition: Secondary | ICD-10-CM | POA: Diagnosis present

## 2018-07-02 DIAGNOSIS — Z515 Encounter for palliative care: Secondary | ICD-10-CM | POA: Diagnosis not present

## 2018-07-02 DIAGNOSIS — G40411 Other generalized epilepsy and epileptic syndromes, intractable, with status epilepticus: Secondary | ICD-10-CM | POA: Diagnosis present

## 2018-07-02 DIAGNOSIS — Z955 Presence of coronary angioplasty implant and graft: Secondary | ICD-10-CM

## 2018-07-02 DIAGNOSIS — Z1159 Encounter for screening for other viral diseases: Secondary | ICD-10-CM | POA: Diagnosis not present

## 2018-07-02 DIAGNOSIS — Y846 Urinary catheterization as the cause of abnormal reaction of the patient, or of later complication, without mention of misadventure at the time of the procedure: Secondary | ICD-10-CM | POA: Diagnosis not present

## 2018-07-02 DIAGNOSIS — J90 Pleural effusion, not elsewhere classified: Secondary | ICD-10-CM | POA: Diagnosis present

## 2018-07-02 DIAGNOSIS — J189 Pneumonia, unspecified organism: Secondary | ICD-10-CM

## 2018-07-02 DIAGNOSIS — T17890A Other foreign object in other parts of respiratory tract causing asphyxiation, initial encounter: Secondary | ICD-10-CM | POA: Diagnosis not present

## 2018-07-02 DIAGNOSIS — R739 Hyperglycemia, unspecified: Secondary | ICD-10-CM | POA: Diagnosis present

## 2018-07-02 DIAGNOSIS — R680 Hypothermia, not associated with low environmental temperature: Secondary | ICD-10-CM | POA: Diagnosis not present

## 2018-07-02 DIAGNOSIS — E870 Hyperosmolality and hypernatremia: Secondary | ICD-10-CM | POA: Diagnosis not present

## 2018-07-02 DIAGNOSIS — G9340 Encephalopathy, unspecified: Secondary | ICD-10-CM | POA: Diagnosis present

## 2018-07-02 DIAGNOSIS — I255 Ischemic cardiomyopathy: Secondary | ICD-10-CM | POA: Diagnosis present

## 2018-07-02 DIAGNOSIS — A0472 Enterocolitis due to Clostridium difficile, not specified as recurrent: Secondary | ICD-10-CM

## 2018-07-02 DIAGNOSIS — J9690 Respiratory failure, unspecified, unspecified whether with hypoxia or hypercapnia: Secondary | ICD-10-CM

## 2018-07-02 DIAGNOSIS — F10239 Alcohol dependence with withdrawal, unspecified: Secondary | ICD-10-CM | POA: Diagnosis present

## 2018-07-02 DIAGNOSIS — Z7902 Long term (current) use of antithrombotics/antiplatelets: Secondary | ICD-10-CM

## 2018-07-02 DIAGNOSIS — G936 Cerebral edema: Secondary | ICD-10-CM | POA: Diagnosis present

## 2018-07-02 DIAGNOSIS — A419 Sepsis, unspecified organism: Secondary | ICD-10-CM

## 2018-07-02 DIAGNOSIS — F329 Major depressive disorder, single episode, unspecified: Secondary | ICD-10-CM | POA: Diagnosis present

## 2018-07-02 DIAGNOSIS — I739 Peripheral vascular disease, unspecified: Secondary | ICD-10-CM | POA: Diagnosis present

## 2018-07-02 DIAGNOSIS — Z79899 Other long term (current) drug therapy: Secondary | ICD-10-CM | POA: Insufficient documentation

## 2018-07-02 DIAGNOSIS — I10 Essential (primary) hypertension: Secondary | ICD-10-CM | POA: Diagnosis present

## 2018-07-02 DIAGNOSIS — N179 Acute kidney failure, unspecified: Secondary | ICD-10-CM | POA: Diagnosis present

## 2018-07-02 DIAGNOSIS — I629 Nontraumatic intracranial hemorrhage, unspecified: Secondary | ICD-10-CM | POA: Diagnosis present

## 2018-07-02 DIAGNOSIS — Z9289 Personal history of other medical treatment: Secondary | ICD-10-CM

## 2018-07-02 DIAGNOSIS — G8194 Hemiplegia, unspecified affecting left nondominant side: Secondary | ICD-10-CM | POA: Diagnosis present

## 2018-07-02 DIAGNOSIS — R1310 Dysphagia, unspecified: Secondary | ICD-10-CM | POA: Diagnosis present

## 2018-07-02 DIAGNOSIS — Z6822 Body mass index (BMI) 22.0-22.9, adult: Secondary | ICD-10-CM

## 2018-07-02 DIAGNOSIS — F17210 Nicotine dependence, cigarettes, uncomplicated: Secondary | ICD-10-CM | POA: Diagnosis present

## 2018-07-02 DIAGNOSIS — J690 Pneumonitis due to inhalation of food and vomit: Secondary | ICD-10-CM | POA: Diagnosis present

## 2018-07-02 DIAGNOSIS — E785 Hyperlipidemia, unspecified: Secondary | ICD-10-CM | POA: Diagnosis present

## 2018-07-02 DIAGNOSIS — I2510 Atherosclerotic heart disease of native coronary artery without angina pectoris: Secondary | ICD-10-CM | POA: Diagnosis not present

## 2018-07-02 DIAGNOSIS — J9601 Acute respiratory failure with hypoxia: Secondary | ICD-10-CM

## 2018-07-02 DIAGNOSIS — L8990 Pressure ulcer of unspecified site, unspecified stage: Secondary | ICD-10-CM

## 2018-07-02 DIAGNOSIS — B9620 Unspecified Escherichia coli [E. coli] as the cause of diseases classified elsewhere: Secondary | ICD-10-CM | POA: Diagnosis present

## 2018-07-02 DIAGNOSIS — R319 Hematuria, unspecified: Secondary | ICD-10-CM | POA: Diagnosis not present

## 2018-07-02 DIAGNOSIS — I959 Hypotension, unspecified: Secondary | ICD-10-CM | POA: Diagnosis present

## 2018-07-02 DIAGNOSIS — J9612 Chronic respiratory failure with hypercapnia: Secondary | ICD-10-CM

## 2018-07-02 DIAGNOSIS — G9341 Metabolic encephalopathy: Secondary | ICD-10-CM

## 2018-07-02 DIAGNOSIS — G40901 Epilepsy, unspecified, not intractable, with status epilepticus: Secondary | ICD-10-CM | POA: Insufficient documentation

## 2018-07-02 DIAGNOSIS — T83098A Other mechanical complication of other indwelling urethral catheter, initial encounter: Secondary | ICD-10-CM | POA: Diagnosis not present

## 2018-07-02 DIAGNOSIS — J9611 Chronic respiratory failure with hypoxia: Secondary | ICD-10-CM

## 2018-07-02 DIAGNOSIS — R4182 Altered mental status, unspecified: Secondary | ICD-10-CM | POA: Diagnosis present

## 2018-07-02 DIAGNOSIS — X58XXXA Exposure to other specified factors, initial encounter: Secondary | ICD-10-CM | POA: Diagnosis not present

## 2018-07-02 DIAGNOSIS — R338 Other retention of urine: Secondary | ICD-10-CM | POA: Diagnosis not present

## 2018-07-02 DIAGNOSIS — I252 Old myocardial infarction: Secondary | ICD-10-CM

## 2018-07-02 DIAGNOSIS — R569 Unspecified convulsions: Secondary | ICD-10-CM

## 2018-07-02 DIAGNOSIS — Y848 Other medical procedures as the cause of abnormal reaction of the patient, or of later complication, without mention of misadventure at the time of the procedure: Secondary | ICD-10-CM | POA: Diagnosis not present

## 2018-07-02 DIAGNOSIS — E876 Hypokalemia: Secondary | ICD-10-CM | POA: Diagnosis not present

## 2018-07-02 DIAGNOSIS — E8770 Fluid overload, unspecified: Secondary | ICD-10-CM | POA: Diagnosis not present

## 2018-07-02 DIAGNOSIS — K219 Gastro-esophageal reflux disease without esophagitis: Secondary | ICD-10-CM | POA: Diagnosis present

## 2018-07-02 DIAGNOSIS — J9600 Acute respiratory failure, unspecified whether with hypoxia or hypercapnia: Secondary | ICD-10-CM | POA: Diagnosis present

## 2018-07-02 DIAGNOSIS — Z7901 Long term (current) use of anticoagulants: Secondary | ICD-10-CM | POA: Insufficient documentation

## 2018-07-02 LAB — BLOOD GAS, ARTERIAL
Acid-base deficit: 6.5 mmol/L — ABNORMAL HIGH (ref 0.0–2.0)
Bicarbonate: 21.7 mmol/L (ref 20.0–28.0)
FIO2: 1
MECHVT: 450 mL
Mechanical Rate: 16
O2 Saturation: 99.9 %
PEEP: 5 cmH2O
Patient temperature: 37
pCO2 arterial: 53 mmHg — ABNORMAL HIGH (ref 32.0–48.0)
pH, Arterial: 7.22 — ABNORMAL LOW (ref 7.350–7.450)
pO2, Arterial: 303 mmHg — ABNORMAL HIGH (ref 83.0–108.0)

## 2018-07-02 LAB — COMPREHENSIVE METABOLIC PANEL
ALT: 9 U/L (ref 0–44)
AST: 22 U/L (ref 15–41)
Albumin: 4.5 g/dL (ref 3.5–5.0)
Alkaline Phosphatase: 79 U/L (ref 38–126)
Anion gap: 31 — ABNORMAL HIGH (ref 5–15)
BUN: 10 mg/dL (ref 8–23)
CO2: 9 mmol/L — ABNORMAL LOW (ref 22–32)
Calcium: 10.1 mg/dL (ref 8.9–10.3)
Chloride: 103 mmol/L (ref 98–111)
Creatinine, Ser: 1.37 mg/dL — ABNORMAL HIGH (ref 0.61–1.24)
GFR calc Af Amer: >60 mL/min (ref >60)
GFR calc non Af Amer: 55 mL/min — ABNORMAL LOW (ref >60)
Glucose, Bld: 216 mg/dL — ABNORMAL HIGH (ref 70–99)
Potassium: 3.6 mmol/L (ref 3.5–5.1)
Sodium: 143 mmol/L (ref 135–145)
Total Bilirubin: 0.4 mg/dL (ref 0.3–1.2)
Total Protein: 8.5 g/dL — ABNORMAL HIGH (ref 6.5–8.1)

## 2018-07-02 LAB — URINALYSIS, COMPLETE (UACMP) WITH MICROSCOPIC
Bacteria, UA: NONE SEEN
Bilirubin Urine: NEGATIVE
Glucose, UA: >=500 mg/dL — AB
Ketones, ur: NEGATIVE mg/dL
Leukocytes,Ua: NEGATIVE
Nitrite: NEGATIVE
Protein, ur: 100 mg/dL — AB
Specific Gravity, Urine: 1.011 (ref 1.005–1.030)
pH: 5 (ref 5.0–8.0)

## 2018-07-02 LAB — URINE DRUG SCREEN, QUALITATIVE (ARMC ONLY)
Amphetamines, Ur Screen: NOT DETECTED
Barbiturates, Ur Screen: NOT DETECTED
Benzodiazepine, Ur Scrn: NOT DETECTED
Cannabinoid 50 Ng, Ur ~~LOC~~: POSITIVE — AB
Cocaine Metabolite,Ur ~~LOC~~: NOT DETECTED
MDMA (Ecstasy)Ur Screen: NOT DETECTED
Methadone Scn, Ur: NOT DETECTED
Opiate, Ur Screen: NOT DETECTED
Phencyclidine (PCP) Ur S: NOT DETECTED
Tricyclic, Ur Screen: NOT DETECTED

## 2018-07-02 LAB — PROTIME-INR
INR: 1.3 — ABNORMAL HIGH (ref 0.8–1.2)
Prothrombin Time: 15.6 seconds — ABNORMAL HIGH (ref 11.4–15.2)

## 2018-07-02 LAB — CBC
HCT: 58.7 % — ABNORMAL HIGH (ref 39.0–52.0)
Hemoglobin: 17.3 g/dL — ABNORMAL HIGH (ref 13.0–17.0)
MCH: 30.1 pg (ref 26.0–34.0)
MCHC: 29.5 g/dL — ABNORMAL LOW (ref 30.0–36.0)
MCV: 102.3 fL — ABNORMAL HIGH (ref 80.0–100.0)
Platelets: 306 10*3/uL (ref 150–400)
RBC: 5.74 MIL/uL (ref 4.22–5.81)
RDW: 13.2 % (ref 11.5–15.5)
WBC: 12.1 10*3/uL — ABNORMAL HIGH (ref 4.0–10.5)
nRBC: 0 % (ref 0.0–0.2)

## 2018-07-02 LAB — ETHANOL: Alcohol, Ethyl (B): <10 mg/dL (ref <10)

## 2018-07-02 LAB — SARS CORONAVIRUS 2 BY RT PCR (HOSPITAL ORDER, PERFORMED IN ~~LOC~~ HOSPITAL LAB): SARS Coronavirus 2: NEGATIVE

## 2018-07-02 LAB — TROPONIN I: Troponin I: <0.03 ng/mL (ref <0.03)

## 2018-07-02 MED ORDER — SODIUM CHLORIDE 0.9 % IV BOLUS: 1000.0000 mL | Freq: Once | INTRAVENOUS | Status: DC

## 2018-07-02 MED ORDER — FOLIC ACID 5 MG/ML IJ SOLN
1.0000 mg | Freq: Every day | INTRAMUSCULAR | Status: DC
  Administered 2018-07-02: 1 mg via INTRAVENOUS
  Filled 2018-07-02: qty 0.2

## 2018-07-02 MED ORDER — LABETALOL HCL 5 MG/ML IV SOLN
10.0000 mg | Freq: Once | INTRAVENOUS | Status: AC
  Administered 2018-07-02: 10 mg via INTRAVENOUS
  Filled 2018-07-02: qty 4

## 2018-07-02 MED ORDER — HEPARIN SODIUM (PORCINE) 5000 UNIT/ML IJ SOLN: 5000.0000 [IU] | Freq: Three times a day (TID) | INTRAMUSCULAR | Status: DC

## 2018-07-02 MED ORDER — INSULIN ASPART 100 UNIT/ML ~~LOC~~ SOLN
0.0000 [IU] | SUBCUTANEOUS | Status: DC
  Administered 2018-07-03: 13:00:00 2 [IU] via SUBCUTANEOUS
  Administered 2018-07-03: 16:00:00 1 [IU] via SUBCUTANEOUS
  Administered 2018-07-03: 20:00:00 2 [IU] via SUBCUTANEOUS
  Administered 2018-07-03 (×2): 1 [IU] via SUBCUTANEOUS
  Administered 2018-07-04: 2 [IU] via SUBCUTANEOUS
  Administered 2018-07-04 – 2018-07-24 (×12): 1 [IU] via SUBCUTANEOUS

## 2018-07-02 MED ORDER — ORAL CARE MOUTH RINSE
15.0000 mL | OROMUCOSAL | Status: DC
  Administered 2018-07-03 – 2018-07-24 (×215): 15 mL via OROMUCOSAL

## 2018-07-02 MED ORDER — PROPOFOL 1000 MG/100ML IV EMUL
INTRAVENOUS | Status: AC
  Filled 2018-07-02: qty 100

## 2018-07-02 MED ORDER — THIAMINE HCL 100 MG/ML IJ SOLN
100.0000 mg | Freq: Once | INTRAMUSCULAR | Status: AC
  Administered 2018-07-02: 18:00:00 100 mg via INTRAVENOUS
  Filled 2018-07-02: qty 2

## 2018-07-02 MED ORDER — LEVETIRACETAM IN NACL 1000 MG/100ML IV SOLN
1000.0000 mg | Freq: Once | INTRAVENOUS | Status: AC
  Administered 2018-07-02: 1000 mg via INTRAVENOUS
  Filled 2018-07-02: qty 100

## 2018-07-02 MED ORDER — PANTOPRAZOLE SODIUM 40 MG IV SOLR
40.0000 mg | Freq: Every day | INTRAVENOUS | Status: DC
  Administered 2018-07-03 – 2018-07-05 (×3): 40 mg via INTRAVENOUS
  Filled 2018-07-02 (×4): qty 40

## 2018-07-02 MED ORDER — BISACODYL 10 MG RE SUPP: 10.0000 mg | Freq: Every day | RECTAL | Status: DC | PRN

## 2018-07-02 MED ORDER — PROPOFOL 1000 MG/100ML IV EMUL: 5.0000 ug/kg/min | INTRAVENOUS | Status: DC

## 2018-07-02 MED ORDER — PROPOFOL 1000 MG/100ML IV EMUL: 0.0000 ug/kg/min | INTRAVENOUS | Status: DC

## 2018-07-02 MED ORDER — SUCCINYLCHOLINE CHLORIDE 20 MG/ML IJ SOLN
INTRAMUSCULAR | Status: AC | PRN
  Administered 2018-07-02: 120 mg via INTRAVENOUS

## 2018-07-02 MED ORDER — ETOMIDATE 2 MG/ML IV SOLN
INTRAVENOUS | Status: AC | PRN
  Administered 2018-07-02: 20 mg via INTRAVENOUS

## 2018-07-02 MED ORDER — FENTANYL CITRATE (PF) 100 MCG/2ML IJ SOLN: 50.0000 ug | INTRAMUSCULAR | Status: DC | PRN

## 2018-07-02 MED ORDER — LORAZEPAM 2 MG/ML IJ SOLN
INTRAMUSCULAR | Status: AC
  Filled 2018-07-02: qty 1

## 2018-07-02 MED ORDER — CHLORHEXIDINE GLUCONATE 0.12% ORAL RINSE (MEDLINE KIT)
15.0000 mL | Freq: Two times a day (BID) | OROMUCOSAL | Status: DC
  Administered 2018-07-03 – 2018-07-24 (×44): 15 mL via OROMUCOSAL

## 2018-07-02 MED ORDER — DOCUSATE SODIUM 50 MG/5ML PO LIQD: 100.0000 mg | Freq: Two times a day (BID) | ORAL | Status: DC | PRN

## 2018-07-02 MED ORDER — LORAZEPAM 2 MG/ML IJ SOLN
2.0000 mg | Freq: Once | INTRAMUSCULAR | Status: AC
  Administered 2018-07-02: 2 mg via INTRAVENOUS

## 2018-07-02 MED ORDER — LORAZEPAM 2 MG/ML IJ SOLN
INTRAMUSCULAR | Status: AC
  Administered 2018-07-02: 2 mg
  Filled 2018-07-02: qty 1

## 2018-07-02 MED ORDER — MIDAZOLAM HCL 2 MG/2ML IJ SOLN: 2.0000 mg | Freq: Once | INTRAMUSCULAR | Status: DC | PRN

## 2018-07-02 MED ORDER — MIDAZOLAM 50MG/50ML (1MG/ML) PREMIX INFUSION
0.5000 mg/h | INTRAVENOUS | Status: DC
  Filled 2018-07-02: qty 50

## 2018-07-02 NOTE — ED Notes (Signed)
Pt unable to sign for transfer consent because pt is intubated.

## 2018-07-02 NOTE — Progress Notes (Signed)
eLink Physician-Brief Progress Note Patient Name: Edward Cooper DOB: 1955/04/09 MRN: [id number]   Date of Service  07/21/2018  HPI/Events of Note  New admission for possible status epilepticus.   eICU Interventions  Remote history of seizures but not on any antiseizure medications. Intubated for airway protection.  Received Keppra loading dose.  On propofol and Versed.  Neurologist evaluated, planning for EEG. Notified bedside ICU team.      Intervention Category Major Interventions: Seizures - evaluation and management;Change in mental status - evaluation and management Intermediate Interventions: Communication with other healthcare providers and/or family Evaluation Type: New Patient Evaluation  Glory Rosebush 07/13/2018, 11:34 PM

## 2018-07-02 NOTE — ED Notes (Signed)
Post Abg  Fio2 to 50% and rate 20 , Gracie  RN  notified Abg and the vent settings changes .

## 2018-07-02 NOTE — ED Provider Notes (Signed)
[facility] Emergency Department Provider Note  Time seen: 6:11 PM  I have reviewed the triage vital signs and the nursing notes.   HISTORY  Chief Complaint Seizures    HPI Edward Cooper is a 63 y.o. male with a past medical history of alcoholism, CAD, gastric reflux, hypertension, hyperlipidemia, cardiomyopathy, seizure disorder, presents to the emergency department for seizures.  According to EMS they picked the patient up from a group home where a another resident called because of seizure activity.   EMS states upon arrival patient appeared to be postictal however shortly after arrival to the emergency department patient had tonic-clonic seizure activity, unresponsive, no corneal reflex.  Blood pressure significantly elevated 225/129 however this was during seizure activity.  Patient desatted into the 50s placed on a nonrebreather and oxygen level came back up into the 90s.  Patient received 2 mg of Ativan shortly after arrival with cessation of seizure.  Patient unable to provide any history or review of systems at this time.  Past Medical History:  Diagnosis Date  . Alcoholism (HCC)   . Coronary artery disease    a. 08/07/12: anterior STEMI s/p DES x 2 to ostial LAD, thrombus extraction of mid LAD - c/b R groin hematoma/focal R femoral thrombus. b.  08/15/12: recurrent acute anterior STEMI with occlusion of LAD stent, (following cessation of integrellin prior to scheduled vascular surgery 08/15/12 for right femoral thrombectomy - later deferred), s/p PTCA/thrombectomy of LAD.   Marland Kitchen. GERD (gastroesophageal reflux disease)   . Groin hematoma    a. R groin hematoma post-cath 07/2012, conservative management.  Marland Kitchen. HTN (hypertension)    a. Soft BP 07/2012 limiting med titration.  . Hyperlipidemia   . Ischemic cardiomyopathy    a. EF 35-40% by echo 07/2012.  . LV (left ventricular) mural thrombus    a. By echo 07/2012 at time of STEMI.  Marland Kitchen. NSVT (nonsustained ventricular  tachycardia) (HCC)    a. Around time of STEMI 08/07/12.  . Seizures (HCC)   . Thrombus    a. R femoral, incidentally noted on CT after cath 07/2012 - Focal thrombus within the distal common femoral artery and involving the proximal superficial femoral artery. Initially planned for surgery but this was deferred after 2nd MI from being off integrillin, DAPT. Med rx, OP f/u vascular.  . Tobacco use   . Ventricular fibrillation (HCC)    a. In cath lab at time of STEMI #2 08/15/12.    Patient Active Problem List   Diagnosis Date Noted  . Anxiety disorder due to general medical condition 10/05/2012  . Peripheral vascular disease, unspecified (HCC) 09/18/2012  . Coronary artery disease   . Hyperlipidemia   . Ischemic cardiomyopathy   . Long term (current) use of anticoagulants 08/24/2012  . Cardiomyopathy, ischemic 08/12/2012  . Groin hematoma 08/12/2012  . Non-occlusive thrombus 08/12/2012  . LV (left ventricular) mural thrombus with acute MI (HCC) 08/09/2012  . Ventricular tachycardia (HCC) 08/08/2012  . ST elevation myocardial infarction (STEMI) of anterior wall, initial episode of care (HCC) 08/07/2012  . Hypertension  08/07/2012  . Tobacco abuse 08/07/2012    Past Surgical History:  Procedure Laterality Date  . CARDIAC CATHETERIZATION    . CORONARY ANGIOPLASTY    . LEFT HEART CATHETERIZATION WITH CORONARY ANGIOGRAM N/A 08/07/2012   Procedure: LEFT HEART CATHETERIZATION WITH CORONARY ANGIOGRAM;  Surgeon: Peter M SwazilandJordan, MD;  [facility];  Service: Cardiovascular;  Laterality: N/A;  . LEFT HEART CATHETERIZATION WITH CORONARY  ANGIOGRAM N/A 08/15/2012   Procedure: LEFT HEART CATHETERIZATION WITH CORONARY ANGIOGRAM;  Surgeon: Kathleene Hazel, MD;  Location: [facility] CATH LAB;  Service: Cardiovascular;  Laterality: N/A;  . None    . PERCUTANEOUS CORONARY STENT INTERVENTION (PCI-S) N/A 08/07/2012   Procedure: PERCUTANEOUS CORONARY STENT INTERVENTION (PCI-S);  Surgeon: Peter M  Swaziland, MD;  Location: [facility] CATH LAB;  Service: Cardiovascular;  Laterality: N/A;  . TONSILLECTOMY      Prior to Admission medications   Medication Sig Start Date End Date Taking? Authorizing Provider  atorvastatin (LIPITOR) 80 MG tablet Take 1 tablet (80 mg total) by mouth daily. 08/22/12   Dunn, Tacey Ruiz, PA-C  carvedilol (COREG) 3.125 MG tablet Take 1 tablet (3.125 mg total) by mouth 2 (two) times daily with a meal. 08/22/12   Dunn, Dayna N, PA-C  lisinopril (PRINIVIL,ZESTRIL) 2.5 MG tablet Take 1 tablet (2.5 mg total) by mouth daily. 09/11/12   Iran Ouch, MD  nitroGLYCERIN (NITROSTAT) 0.4 MG SL tablet Place 1 tablet (0.4 mg total) under the tongue every 5 (five) minutes as needed for chest pain (up to 3 doses). 08/22/12   Dunn, Tacey Ruiz, PA-C  pantoprazole (PROTONIX) 40 MG tablet Take 1 tablet (40 mg total) by mouth daily. 08/22/12   Dunn, Tacey Ruiz, PA-C  sertraline (ZOLOFT) 50 MG tablet Take 1 tablet (50 mg total) by mouth daily. 10/05/12   Iran Ouch, MD  Ticagrelor (BRILINTA) 90 MG TABS tablet Take 1 tablet (90 mg total) by mouth 2 (two) times daily. 08/22/12   Laurann Montana, PA-C  warfarin (COUMADIN) 10 MG tablet Take as directed by Anticoagulation clinic 09/11/12   Iran Ouch, MD    No Known Allergies  Family History  Family history unknown: Yes    Social History Social History   Tobacco Use  . Smoking status: Current Every Day Smoker    Packs/day: 0.50    Years: 40.00    Pack years: 20.00    Types: Cigarettes  . Smokeless tobacco: Never Used  . Tobacco comment: pt states that he is trying to quit  Substance Use Topics  . Alcohol use: Yes    Alcohol/week: 6.0 standard drinks    Types: 6 Cans of beer per week  . Drug use: No    Review of systems: Unable to provide a review of systems at this time secondary to altered mental status/unresponsiveness  ____________________________________________   PHYSICAL EXAM:  VITAL SIGNS: ED Triage Vitals  Enc Vitals  Group     BP 07/01/2018 1804 (!) 225/129     Pulse Rate 07/27/2018 1804 (!) 141     Resp 07/23/2018 1804 (!) 21     Temp --      Temp Source 07/23/2018 1804 Oral     SpO2 07/29/2018 1804 97 %     Weight 06/30/2018 1805 220 lb (99.8 kg)     Height 07/11/2018 1805  (1.905 m)     Head Circumference --      Peak Flow --      Pain Score 07/01/2018 1805 Asleep     Pain Loc --      Pain Edu? --      Excl. in GC? --    Constitutional: Patient presents to the emergency department awake, shortly afterwards developed tonic-clonic seizure activity. Eyes: Blinking eyes bilaterally.  Normal-appearing pupils. ENT      Head: Normocephalic and atraumatic      Mouth/Throat: Mucous membranes are moist.  Drooling  from the mouth. Cardiovascular: Regular rhythm rate around 140 bpm.  No obvious murmur. Respiratory: Normal respiratory effort without tachypnea nor retractions. Breath sounds are clear  Gastrointestinal: Soft and nontender. No distention.   Musculoskeletal: Tonic-clonic seizure activity Neurologic: Tonic-clonic seizure activity moving all extremities. Skin:  Skin is warm, dry and intact.  Psychiatric: Unable to adequately assess due to seizure/postictal state.  ____________________________________________    EKG  EKG viewed and interpreted by myself shows sinus tachycardia 142 bpm with a narrow QRS, normal axis, Mild ST depression likely consistent with demand ischemia.  ____________________________________________    RADIOLOGY  CT scan of the head shows no acute abnormality  ____________________________________________   INITIAL IMPRESSION / ASSESSMENT AND PLAN / ED COURSE  Pertinent labs & imaging results that were available during my care of the patient were reviewed by me and considered in my medical decision making (see chart for details).   Patient presents to the emergency department with reported seizure activity at his group home.  Patient began actively seizing shortly after  arrival to the emergency department.  Patient given 2 mg of IV Ativan, seizure was stopped.  Patient is now somnolent/postictal.  Patient's blood pressure significantly elevated as well as his pulse rate however this was during a seizure, we will recheck.  We will check labs as well as a CT scan of the head.  Per record review patient appears to take warfarin, I do not see any antiepileptics for the patient.  We will check an INR.  We will load with Keppra and continue to closely monitor.  CT scan does not appear to show any acute abnormality or bleed.  Patient continues to have focal seizure activity, remains unresponsive.  On nonrebreather patient is satting in the upper 80s to lower 90s.  Patient not responding to sternal rub.  Patient given additional 2 mg of IV Ativan, with significant reduction in blood pressure, patient does not appear to be seizing at this time.  Patient loaded with Keppra.  Patient remains unresponsive to painful stimuli, no corneal reflex, intubation performed by myself.  Patient placed on a Versed and propofol infusion.  Patient receiving Keppra bolus.  Patient's labs are largely within normal limits.  Patient will require admission to the hospital for an EEG.  We will discussed with neurology but the patient will likely require transfer for continuous EEG.  I discussed the patient with neurology at [facility], they will see the patient.  We will discussed with critical care for urgent/emergent transfer for further work-up and EEG.  Discussed patient with intensive care, they have accepted the patient as a transfer.  Reassuringly patient did make reaction to coronavirus test, indicating purposeful movement.  Patient currently on propofol and Versed drip for the patient.  INTUBATION Performed by: Minna Antis  Required items: required blood products, implants, devices, and special equipment available Patient identity confirmed: provided demographic data and  hospital-assigned identification number Time out: Immediately prior to procedure a "time out" was called to verify the correct patient, procedure, equipment, support staff and site/side marked as required.  Indications: Airway protection  Intubation method: S4 Glidescope Laryngoscopy   Preoxygenation: 100% BVM  Sedatives: 20 mg etomidate Paralytic: 120 mg succinylcholine  Tube Size: 8.0 cuffed  Post-procedure assessment: chest rise and ETCO2 monitor Breath sounds: equal and absent over the epigastrium Tube secured with: ETT holder Chest x-ray interpreted by radiologist and me.  Chest x-ray findings: endotracheal tube in appropriate position  Patient tolerated the procedure well  with no immediate complications.  CRITICAL CARE Performed by: Minna Antis   Total critical care time: 60 minutes  Critical care time was exclusive of separately billable procedures and treating other patients.  Critical care was necessary to treat or prevent imminent or life-threatening deterioration.  Critical care was time spent personally by me on the following activities: development of treatment plan with patient and/or surrogate as well as nursing, discussions with consultants, evaluation of patient's response to treatment, examination of patient, obtaining history from patient or surrogate, ordering and performing treatments and interventions, ordering and review of laboratory studies, ordering and review of radiographic studies, pulse oximetry and re-evaluation of patient's condition.     Edward Cooper was evaluated in Emergency Department on 07/17/2018 for the symptoms described in the history of present illness. He was evaluated in the context of the global COVID-19 pandemic, which necessitated consideration that the patient might be at risk for infection with the SARS-CoV-2 virus that causes COVID-19. Institutional protocols and algorithms that pertain to the evaluation of patients at  risk for COVID-19 are in a state of rapid change based on information released by regulatory bodies including the CDC and federal and state organizations. These policies and algorithms were followed during the patient's care in the ED.  ____________________________________________   FINAL CLINICAL IMPRESSION(S) / ED DIAGNOSES  Status epilepticus   Minna Antis, MD 07/16/2018 1947

## 2018-07-02 NOTE — H&P (Addendum)
NAME:  Edward Cooper, MRN:  [id number], DOB:  1955/07/20, LOS: 0 ADMISSION DATE:  07/17/2018, CONSULTATION DATE:  07/18/2018 REFERRING MD:  [facility], CHIEF COMPLAINT:  seizures  Brief History   63 yoM presenting with witnessed seizures x 3, did not return to baseline and required intubation for airway protection.  Transferred from [facility] to [facility] for continuous EEG.    History of present illness   HPI obtained from medical chart review as patient is intubated on mechanical ventilation.   63 year old male with history of CAD s/p PCI, LV thrombus on coumadin,  PVD, GERD, HTN, HLD, ICM, seizures, tobacco and ETOH abuse who presented from his boarding house with EMS for reported seizure to [facility] ED.  It appears patient is not on any home anticonvulsants.   Reportedly add focal seizure with EMS and noted to be hypertensive 224/124 given ativan. He remained postictal.  Shortly after arrival to ER, he had another seizure, given ativan.  Required NRB to maintain saturations.  CT head was negative for bleed or abnormality.  Loaded with keppra.  Labs noted for INR 1.3, glucose 213, WBC 12, UA neg, UDS positive for THC.  Had ongoing focal seizure activity and unresponsive requiring intubation. CXR with right base infiltrate.  He is to be transferred to [facility] with Neurology consulting for continuous EEG, PCCM to admit.   Past Medical History  CAD s/p PCI, LV thrombus on coumadin,  PVD, GERD, HTN, HLD, ICM, seizures, tobacco and ETOH abuse  Significant Hospital Events   5/4 Admit  Consults:  Neurology   Procedures:  5/4 ETT >>  Significant Diagnostic Tests:  5/4 [facility] >> chronic changes, no acute abnormality  5/5 EEG >>  Micro Data:  5/4 MRSA PCR  >>  Antimicrobials:  5/5 unasyn >>  Interim history/subjective:   Objective   Blood pressure (!) 140/113, pulse 93, temperature 99.7 F (37.6 C), temperature source Axillary, resp. rate 20, height 5\' 11"  (1.803 m), SpO2 96 %.    Vent Mode: PRVC FiO2  (%):  [50 %-100 %] 60 % Set Rate:  [16 bmp-20 bmp] 16 bmp Vt Set:  [450 mL-600 mL] 600 mL PEEP:  [5 cmH20] 5 cmH20 Plateau Pressure:  [17 cmH20] 17 cmH20  No intake or output data in the 24 hours ending 07/01/2018 2344 There were no vitals filed for this visit.  Examination: General:  Critically ill thin adult male on MV  HEENT: MM pink/moist, ETT at 24 cm, OGT, pupils 3/sluggish Neuro:  Sedated, on propofol 20 mcg/min, withdrawals in LE, not f/c CV: SR, no murmur  PULM: even/non-labored, lungs bilaterally clear GI: soft, non-distended, bs active, foley with CYU Extremities: warm/dry, no LE edema  Skin: no rashes  Resolved Hospital Problem list    Assessment & Plan:   Acute encephalopathy R/o SE, hx seizures - THC positive  Hx ETOH abuse P:  Appreciate Neurology assistance Keppra per neurology Trend neuro exams EEG per neurology Seizure precautions Versed prn seizure PAD protocol with propofol / prn fentanyl  RASS goal 0/-1 or pending EEG for burst suppression if needed Daily thiamine/ folate   Acute respiratory insufficiency RLL infiltrate/ probable aspiration  P:  Full MV support, PRVC 8 cc/kg, rate 16 Wean FiO2/ PEEP for goal sat >94-99% CXR / ABG now VAP bundle Prn albuterol  Daily SBT   Leukocytosis RLL infiltrate / ?aspiration - UA ok P:  Empiric unasyn for now Assess PCT  Trend WBC / fever curve   AKI  P:  Trend BMP / mag/ phos / urinary output Replace electrolytes as indicated Avoid nephrotoxic agents, ensure adequate renal perfusion  HTN HLD - neg trop/ normal EKG P:  Hold home coreg/ lisinopril Continue lipitor  CAD s/p PCI, LV thrombus on coumadin,  PVD, ICM (EF 35-40% 2014) - INR 1.3 P:  Continue brilinta Hold coumadin, heparin per pharmacy   Hyperglycemia P:  SSI sensitive CBG q 4  Depression P:  Holding home zoloft   Best practice:  Diet: NPO Pain/Anxiety/Delirium protocol (if indicated): Propofol/ prn fentanyl VAP  protocol (if indicated): yes DVT prophylaxis: heparin gtt GI prophylaxis: PPI Glucose control: SSI sensitive Mobility: BR Code Status: Full  Family Communication:  Disposition: ICU  Labs   CBC: Recent Labs  Lab 08-01-2018 1809  WBC 12.1*  HGB 17.3*  HCT 58.7*  MCV 102.3*  PLT 306    Basic Metabolic Panel: Recent Labs  Lab 01-Aug-2018 1809  NA 143  K 3.6  CL 103  CO2 9*  GLUCOSE 216*  BUN 10  CREATININE 1.37*  CALCIUM 10.1   GFR: Estimated Creatinine Clearance: 68.2 mL/min (A) (by C-G formula based on SCr of 1.37 mg/dL (H)). Recent Labs  Lab 08/01/18 1809  WBC 12.1*    Liver Function Tests: Recent Labs  Lab 2018-08-01 1809  AST 22  ALT 9  ALKPHOS 79  BILITOT 0.4  PROT 8.5*  ALBUMIN 4.5   No results for input(s): LIPASE, AMYLASE in the last 168 hours. No results for input(s): AMMONIA in the last 168 hours.  ABG    Component Value Date/Time   PHART 7.22 (L) 08/01/18 1946   PCO2ART 53 (H) 08/01/18 1946   PO2ART 303 (H) 08/01/18 1946   HCO3 21.7 2018-08-01 1946   TCO2 18 08/07/2012 1843   ACIDBASEDEF 6.5 (H) 08-01-18 1946   O2SAT 99.9 2018-08-01 1946     Coagulation Profile: Recent Labs  Lab Aug 01, 2018 1809  INR 1.3*    Cardiac Enzymes: Recent Labs  Lab 08/01/2018 1809  TROPONINI <0.03    HbA1C: Hgb A1c MFr Bld  Date/Time Value Ref Range Status  08/07/2012 06:45 PM 5.1 <5.7 % Final    Comment:    (NOTE)                                                                       According to the ADA Clinical Practice Recommendations for 2011, when HbA1c is used as a screening test:  >=6.5%   Diagnostic of Diabetes Mellitus           (if abnormal result is confirmed) 5.7-6.4%   Increased risk of developing Diabetes Mellitus References:Diagnosis and Classification of Diabetes Mellitus,Diabetes Care,2011,34(Suppl 1):S62-S69 and Standards of Medical Care in         Diabetes - 2011,Diabetes Care,2011,34 (Suppl 1):S11-S61.    CBG: No  results for input(s): GLUCAP in the last 168 hours.  Review of Systems:   Unable   Past Medical History  He,  has a past medical history of Alcoholism (HCC), Coronary artery disease, GERD (gastroesophageal reflux disease), Groin hematoma, HTN (hypertension), Hyperlipidemia, Ischemic cardiomyopathy, LV (left ventricular) mural thrombus, NSVT (nonsustained ventricular tachycardia) (HCC), Seizures (HCC), Thrombus, Tobacco use, and Ventricular fibrillation (HCC).   Surgical History  Past Surgical History:  Procedure Laterality Date  . CARDIAC CATHETERIZATION    . CORONARY ANGIOPLASTY    . LEFT HEART CATHETERIZATION WITH CORONARY ANGIOGRAM N/A 08/07/2012   Procedure: LEFT HEART CATHETERIZATION WITH CORONARY ANGIOGRAM;  Surgeon: Peter M Swaziland, MD;  Location: [facility] CATH LAB;  Service: Cardiovascular;  Laterality: N/A;  . LEFT HEART CATHETERIZATION WITH CORONARY ANGIOGRAM N/A 08/15/2012   Procedure: LEFT HEART CATHETERIZATION WITH CORONARY ANGIOGRAM;  Surgeon: Kathleene Hazel, MD;  Location: [facility] CATH LAB;  Service: Cardiovascular;  Laterality: N/A;  . None    . PERCUTANEOUS CORONARY STENT INTERVENTION (PCI-S) N/A 08/07/2012   Procedure: PERCUTANEOUS CORONARY STENT INTERVENTION (PCI-S);  Surgeon: Peter M Swaziland, MD;  Location: [facility] CATH LAB;  Service: Cardiovascular;  Laterality: N/A;  . TONSILLECTOMY       Social History   reports that he has been smoking cigarettes. He has a 20.00 pack-year smoking history. He has never used smokeless tobacco. He reports current alcohol use of about 6.0 standard drinks of alcohol per week. He reports that he does not use drugs.   Family History   His Family history is unknown by patient.   Allergies No Known Allergies   Home Medications  Prior to Admission medications   Medication Sig Start Date End Date Taking? Authorizing Provider  atorvastatin (LIPITOR) 80 MG tablet Take 1 tablet (80 mg total) by mouth daily. 08/22/12   Dunn, Tacey Ruiz, PA-C   carvedilol (COREG) 3.125 MG tablet Take 1 tablet (3.125 mg total) by mouth 2 (two) times daily with a meal. 08/22/12   Dunn, Dayna N, PA-C  lisinopril (PRINIVIL,ZESTRIL) 2.5 MG tablet Take 1 tablet (2.5 mg total) by mouth daily. 09/11/12   Iran Ouch, MD  nitroGLYCERIN (NITROSTAT) 0.4 MG SL tablet Place 1 tablet (0.4 mg total) under the tongue every 5 (five) minutes as needed for chest pain (up to 3 doses). 08/22/12   Dunn, Tacey Ruiz, PA-C  pantoprazole (PROTONIX) 40 MG tablet Take 1 tablet (40 mg total) by mouth daily. 08/22/12   Dunn, Tacey Ruiz, PA-C  sertraline (ZOLOFT) 50 MG tablet Take 1 tablet (50 mg total) by mouth daily. 10/05/12   Iran Ouch, MD  Ticagrelor (BRILINTA) 90 MG TABS tablet Take 1 tablet (90 mg total) by mouth 2 (two) times daily. 08/22/12   Laurann Montana, PA-C  warfarin (COUMADIN) 10 MG tablet Take as directed by Anticoagulation clinic 09/11/12   Iran Ouch, MD     Critical care time: 35 mins    Posey Boyer, MSN, AGACNP-BC [facility] Pgr: [phone] or if no answer [phone] 07/03/2018, 3:35 AM   Attending MD note  Patient was independently seen and examined, treatment plan was discussed with the  Advance Practice Provider. I agree with the above note by Posey Boyer. I have personally reviewed the clinical findings, labs, ECG, imaging studies and management of this patient in detail. I have also reviewed the orders written for this patient which were under my direction. I agree with the documentation, as recorded by the Advance Practice Provider.   Briefly, Edward Cooper is a 62 y.o. male here with reported seizures.  No seizures now, after intubated and started on propofol (versed also had been given).  Remains intubated and sedated at time of my exam. Awakening trials while on EEG continuously. Seizure meds per neuro.   This patient is critically ill, requiring high complexity decision making for assessment and plan, frequent  evaluation, application of  advanced monitoring and extensive interpretations of multiple databases.    Critical Care time devoted to patient care services described in this note is 35  minutes, not including time spent on procedures, teaching or supervising.    Charlotte SanesNicole Reuven Braver, MD

## 2018-07-02 NOTE — ED Triage Notes (Signed)
Pt to ED via EMS from boarding house. per ems pt had seizure before their arrival per residents, upon ems arrival pt was post ictal, pt had focal seizure while with ems. Pt arrived post ictal and began seizing again. Pt was given 2mg  ativan and placed on NRB. Pt has no hx of seizures, pt arrives with bp 224/124

## 2018-07-02 NOTE — Consult Note (Signed)
Neurology Consultation  Reason for Consult: seizure, status epilepticus Referring Physician: Dr. Marcos Eke  CC: seizures  History is obtained from:chart review  HPI: Edward Cooper is a 63 y.o. male has a past medical history of alcoholism, coronary artery disease, hypertension, ischemic cardiomyopathy EF 35 to 40% from an echo of 2014, history of LV mural thrombus in 2014, seizures, tobacco use, presented to [facility] emergency department for seizures. According to the emergency department notes, EMS was called at a group home where he resides when another resident witnessed patient having seizure activity.  Unclear time/description of seizure activity. In the emergency room he had another witnessed generalized tonic-clonic seizure episode after which he became unresponsive.  His blood pressures were significantly elevated to systolics of 220s however that was only during the seizure activity according to the notes.  He had desaturations in his oxygenation down to the 50s, initially placed on a nonrebreather with O2 levels coming back up in the 90s but had difficulty protecting his airway after receiving Ativan and had to be intubated at that time. He was loaded with Keppra.  Noncontrast head CT was done which showed a right MCA territory area of encephalomalacia but no hyperdense vessel or bleed or evolving new stroke. He was transferred to [facility] for further management including electrographic studies.   LKW: Prior to presentation at [facility] tpa given?: no, presented as a seizure, witnessed seizure activity. Premorbid modified Rankin scale (mRS): Unable to ascertain at this point  ROS: Unable to obtain due to altered mental status.   Past Medical History:  Diagnosis Date  . Alcoholism (HCC)   . Coronary artery disease    a. 08/07/12: anterior STEMI s/p DES x 2 to ostial LAD, thrombus extraction of mid LAD - c/b R groin hematoma/focal R femoral thrombus. b.   08/15/12: recurrent acute anterior STEMI with occlusion of LAD stent, (following cessation of integrellin prior to scheduled vascular surgery 08/15/12 for right femoral thrombectomy - later deferred), s/p PTCA/thrombectomy of LAD.   Marland Kitchen GERD (gastroesophageal reflux disease)   . Groin hematoma    a. R groin hematoma post-cath 07/2012, conservative management.  Marland Kitchen HTN (hypertension)    a. Soft BP 07/2012 limiting med titration.  . Hyperlipidemia   . Ischemic cardiomyopathy    a. EF 35-40% by echo 07/2012.  . LV (left ventricular) mural thrombus    a. By echo 07/2012 at time of STEMI.  Marland Kitchen NSVT (nonsustained ventricular tachycardia) (HCC)    a. Around time of STEMI 08/07/12.  . Seizures (HCC)   . Thrombus    a. R femoral, incidentally noted on CT after cath 07/2012 - Focal thrombus within the distal common femoral artery and involving the proximal superficial femoral artery. Initially planned for surgery but this was deferred after 2nd MI from being off integrillin, DAPT. Med rx, OP f/u vascular.  . Tobacco use   . Ventricular fibrillation (HCC)    a. In cath lab at time of STEMI #2 08/15/12.    Family History  Family history unknown: Yes   Social History:   reports that he has been smoking cigarettes. He has a 20.00 pack-year smoking history. He has never used smokeless tobacco. He reports current alcohol use of about 6.0 standard drinks of alcohol per week. He reports that he does not use drugs.  I was not able to obtain any social history or family history because of the patient being intubated.  Medications No current facility-administered medications for this  encounter.    Exam: Current vital signs: BP (!) 140/113 (BP Location: Right Arm)   Pulse 93   Temp 99.7 F (37.6 C) (Axillary)   Resp 20   Ht 5\' 11"  (1.803 m)   SpO2 96%   BMI 30.68 kg/m  Vital signs in last 24 hours: Temp:  [99 F (37.2 C)-99.7 F (37.6 C)] 99.7 F (37.6 C) (05/04 2300) Pulse Rate:  [90-141] 93 (05/04  2300) Resp:  [20-26] 20 (05/04 2300) BP: (120-225)/(89-129) 140/113 (05/04 2300) SpO2:  [93 %-97 %] 96 % (05/04 2300) FiO2 (%):  [50 %-100 %] 60 % (05/04 2300) Weight:  [99.8 kg] 99.8 kg (05/04 1805) General: Sedated on propofol 5 an hour and Versed 0.5 an hour, intubated HEENT: Normocephalic, atraumatic CVS: S1-S2 heard regular rate rhythm Respiratory: Intubated, vented Abdomen: Nondistended nontender Extremities: No edema Neurological exam Patient sedated and intubated as above Does not follow any commands Does not open eyes to noxious stimulation or voice. Cranial nerves: Pupils are 2 mm, sluggishly reactive bilaterally.  Has positive corneal reflexes bilaterally.  He is currently breathing with the ventilator.+Cough/gag Motor exam: No spontaneous movement.  On noxious stimulation, withdraws both lower extremities with not much response in the upper extremities to noxious stimulation. Sensory exam: As above Coordination gait testing cannot be performed due to his current mentation.  Labs I have reviewed labs in epic and the results pertinent to this consultation are: Mild leukocytosis, hemoglobin 17.3, glucose 216, creatinine 1.37, BUN 10,  CBC    Component Value Date/Time   WBC 12.1 (H) 07/14/2018 1809   RBC 5.74 07/16/2018 1809   HGB 17.3 (H) 07/20/2018 1809   HCT 58.7 (H) 07/21/2018 1809   PLT 306 07/05/2018 1809   MCV 102.3 (H) 07/29/2018 1809   MCH 30.1 07/12/2018 1809   MCHC 29.5 (L) 07/01/2018 1809   RDW 13.2 07/17/2018 1809    CMP     Component Value Date/Time   NA 143 07/07/2018 1809   K 3.6 07/06/2018 1809   CL 103 07/27/2018 1809   CO2 9 (L) 07/05/2018 1809   GLUCOSE 216 (H) 07/18/2018 1809   BUN 10 07/23/2018 1809   CREATININE 1.37 (H) 07/18/2018 1809   CALCIUM 10.1 07/08/2018 1809   PROT 8.5 (H) 07/01/2018 1809   ALBUMIN 4.5 07/01/2018 1809   AST 22 07/29/2018 1809   ALT 9 07/20/2018 1809   ALKPHOS 79 07/11/2018 1809   BILITOT 0.4 07/01/2018 1809    GFRNONAA 55 (L) 07/03/2018 1809   GFRAA >60 07/07/2018 1809  Urinary toxicology screen positive for cannabinoids No alcohol detected in his blood test  Imaging I have reviewed the images obtained:  CT-scan of the brain-no acute changes.  Chronic right encephalomalacia.  No hyperdense vessels on the noncontrast enhanced CT of the head.   Assessment: 63 year old with above past medical history brought in from a group home after having seizure activity, not returning to baseline and had been having witnessed tonic-clonic episode in the emergency room at [facility], for which she was given benzodiazepines and loaded with Keppra, not returning to baseline mentation requiring to be intubated for airway protection and transferred to [facility] for higher level of care. He currently remains on very low amounts of sedation-propofol and Versed and has an exam consistent with intact brainstem reflexes and some withdrawal in his lower extremities. He has a history of alcoholism and unclear when his last alcohol consumption was, his current alcohol level is undetectable. He  has a documented history of seizures on his chart as well but no antiepileptics listed on the chart.  No collateral available at this time. Loaded with Keppra in the outside ER and given benzodiazepines to break the seizure. Presentation consistent with possible status epilepticus. Other considerations include stroke as he has a history of LV thrombus and on Coumadin  Impression: --Status epilepticus-seizures secondary to alcohol withdrawal versus breakthrough seizure-limited history available --Evaluate for stroke given the history of LV thrombus  Recommendations: Continue Keppra 500 twice daily (received a load in the ED) Discontinue the Versed drip-only running at 0.5 mg an hour.  Will decide to resume based on EEG results. Stat EEG followed by long-term EEG-technologist notified and on their way to  work the patient up to EEG. Medical management per primary team Neurology will continue to follow  -- Milon DikesAshish Kimball Manske, MD [facility] Pager: [phone] If 7pm to 7am, please call on call as listed on AMION.  CRITICAL CARE ATTESTATION Performed by: Milon DikesAshish Clema Skousen, MD Total critical care time: 40 minutes Critical care time was exclusive of separately billable procedures and treating other patients and/or supervising APPs/Residents/Students Critical care was necessary to treat or prevent imminent or life-threatening deterioration due to seizures, status epilepticus, resp failure. This patient is critically ill and at significant risk for neurological worsening and/or death and care requires constant monitoring. Critical care was time spent personally by me on the following activities: development of treatment plan with patient and/or surrogate as well as nursing, discussions with consultants, evaluation of patient's response to treatment, examination of patient, obtaining history from patient or surrogate, ordering and performing treatments and interventions, ordering and review of laboratory studies, ordering and review of radiographic studies, pulse oximetry, re-evaluation of patient's condition, participation in multidisciplinary rounds and medical decision making of high complexity in the care of this patient.

## 2018-07-03 ENCOUNTER — Encounter (HOSPITAL_COMMUNITY): Payer: Self-pay

## 2018-07-03 ENCOUNTER — Inpatient Hospital Stay (HOSPITAL_COMMUNITY): Payer: Medicare Other

## 2018-07-03 DIAGNOSIS — R569 Unspecified convulsions: Secondary | ICD-10-CM

## 2018-07-03 DIAGNOSIS — J9601 Acute respiratory failure with hypoxia: Secondary | ICD-10-CM

## 2018-07-03 DIAGNOSIS — G9340 Encephalopathy, unspecified: Secondary | ICD-10-CM

## 2018-07-03 DIAGNOSIS — J9600 Acute respiratory failure, unspecified whether with hypoxia or hypercapnia: Secondary | ICD-10-CM

## 2018-07-03 LAB — POCT I-STAT 7, (LYTES, BLD GAS, ICA,H+H)
Acid-base deficit: 4 mmol/L — ABNORMAL HIGH (ref 0.0–2.0)
Bicarbonate: 21.4 mmol/L (ref 20.0–28.0)
Calcium, Ion: 1.16 mmol/L (ref 1.15–1.40)
HCT: 49 % (ref 39.0–52.0)
Hemoglobin: 16.7 g/dL (ref 13.0–17.0)
O2 Saturation: 98 %
Patient temperature: 98.6
Potassium: 3.9 mmol/L (ref 3.5–5.1)
Sodium: 140 mmol/L (ref 135–145)
TCO2: 23 mmol/L (ref 22–32)
pCO2 arterial: 41.2 mmHg (ref 32.0–48.0)
pH, Arterial: 7.325 — ABNORMAL LOW (ref 7.350–7.450)
pO2, Arterial: 117 mmHg — ABNORMAL HIGH (ref 83.0–108.0)

## 2018-07-03 LAB — GLUCOSE, CAPILLARY
Glucose-Capillary: 125 mg/dL — ABNORMAL HIGH (ref 70–99)
Glucose-Capillary: 125 mg/dL — ABNORMAL HIGH (ref 70–99)
Glucose-Capillary: 127 mg/dL — ABNORMAL HIGH (ref 70–99)
Glucose-Capillary: 141 mg/dL — ABNORMAL HIGH (ref 70–99)
Glucose-Capillary: 147 mg/dL — ABNORMAL HIGH (ref 70–99)
Glucose-Capillary: 171 mg/dL — ABNORMAL HIGH (ref 70–99)
Glucose-Capillary: 178 mg/dL — ABNORMAL HIGH (ref 70–99)

## 2018-07-03 LAB — CBC
HCT: 48.9 % (ref 39.0–52.0)
Hemoglobin: 16.1 g/dL (ref 13.0–17.0)
MCH: 30 pg (ref 26.0–34.0)
MCHC: 32.9 g/dL (ref 30.0–36.0)
MCV: 91.1 fL (ref 80.0–100.0)
Platelets: 168 10*3/uL (ref 150–400)
RBC: 5.37 MIL/uL (ref 4.22–5.81)
RDW: 13.4 % (ref 11.5–15.5)
WBC: 11.8 10*3/uL — ABNORMAL HIGH (ref 4.0–10.5)
nRBC: 0 % (ref 0.0–0.2)

## 2018-07-03 LAB — HEPARIN LEVEL (UNFRACTIONATED)
Heparin Unfractionated: 0.56 IU/mL (ref 0.30–0.70)
Heparin Unfractionated: 0.44 IU/mL (ref 0.30–0.70)

## 2018-07-03 LAB — HIV ANTIBODY (ROUTINE TESTING W REFLEX): HIV Screen 4th Generation wRfx: NONREACTIVE

## 2018-07-03 LAB — BASIC METABOLIC PANEL
Anion gap: 12 (ref 5–15)
BUN: 8 mg/dL (ref 8–23)
CO2: 22 mmol/L (ref 22–32)
Calcium: 8.5 mg/dL — ABNORMAL LOW (ref 8.9–10.3)
Chloride: 106 mmol/L (ref 98–111)
Creatinine, Ser: 1.2 mg/dL (ref 0.61–1.24)
GFR calc Af Amer: >60 mL/min (ref >60)
GFR calc non Af Amer: >60 mL/min (ref >60)
Glucose, Bld: 120 mg/dL — ABNORMAL HIGH (ref 70–99)
Potassium: 4 mmol/L (ref 3.5–5.1)
Sodium: 140 mmol/L (ref 135–145)

## 2018-07-03 LAB — TRIGLYCERIDES: Triglycerides: 101 mg/dL (ref <150)

## 2018-07-03 LAB — MAGNESIUM: Magnesium: 2.5 mg/dL — ABNORMAL HIGH (ref 1.7–2.4)

## 2018-07-03 LAB — PHOSPHORUS: Phosphorus: 3.2 mg/dL (ref 2.5–4.6)

## 2018-07-03 LAB — MRSA PCR SCREENING: MRSA by PCR: NEGATIVE

## 2018-07-03 LAB — PROCALCITONIN: Procalcitonin: 2.83 ng/mL

## 2018-07-03 MED ORDER — SODIUM CHLORIDE 0.9 % IV SOLN: INTRAVENOUS | Status: DC | PRN

## 2018-07-03 MED ORDER — LORAZEPAM BOLUS VIA INFUSION: 1.0000 mg | INTRAVENOUS | Status: DC

## 2018-07-03 MED ORDER — SODIUM CHLORIDE 0.9 % IV SOLN
INTRAVENOUS | Status: DC | PRN
  Administered 2018-07-03: 250 mL via INTRAVENOUS
  Administered 2018-07-10 – 2018-07-19 (×2): 500 mL via INTRAVENOUS

## 2018-07-03 MED ORDER — FENTANYL CITRATE (PF) 100 MCG/2ML IJ SOLN
50.0000 ug | INTRAMUSCULAR | Status: DC | PRN
  Administered 2018-07-08: 05:00:00 50 ug via INTRAVENOUS
  Administered 2018-07-22 – 2018-07-24 (×4): 100 ug via INTRAVENOUS
  Filled 2018-07-03 (×7): qty 2

## 2018-07-03 MED ORDER — VALPROATE SODIUM 500 MG/5ML IV SOLN
1800.0000 mg | Freq: Once | INTRAVENOUS | Status: AC
  Administered 2018-07-03: 14:00:00 1800 mg via INTRAVENOUS
  Filled 2018-07-03: qty 18

## 2018-07-03 MED ORDER — FOLIC ACID 5 MG/ML IJ SOLN
1.0000 mg | Freq: Every day | INTRAMUSCULAR | Status: DC
  Administered 2018-07-03 – 2018-07-09 (×7): 1 mg via INTRAVENOUS
  Filled 2018-07-03 (×8): qty 0.2

## 2018-07-03 MED ORDER — ACETAMINOPHEN 160 MG/5ML PO SOLN
650.0000 mg | Freq: Four times a day (QID) | ORAL | Status: DC | PRN
  Administered 2018-07-03 – 2018-07-24 (×20): 650 mg
  Filled 2018-07-03 (×16): qty 20.3

## 2018-07-03 MED ORDER — TICAGRELOR 90 MG PO TABS
90.0000 mg | ORAL_TABLET | Freq: Two times a day (BID) | ORAL | Status: DC
  Administered 2018-07-03 – 2018-07-04 (×2): 90 mg
  Filled 2018-07-03 (×2): qty 1

## 2018-07-03 MED ORDER — LORAZEPAM 2 MG/ML IJ SOLN: 1.0000 mg | INTRAMUSCULAR | Status: DC | PRN

## 2018-07-03 MED ORDER — PRO-STAT SUGAR FREE PO LIQD
30.0000 mL | Freq: Every day | ORAL | Status: DC
  Administered 2018-07-03 – 2018-07-09 (×7): 30 mL
  Filled 2018-07-03 (×7): qty 30

## 2018-07-03 MED ORDER — SODIUM CHLORIDE 0.9 % IV SOLN
200.0000 mg | Freq: Two times a day (BID) | INTRAVENOUS | Status: DC
  Administered 2018-07-03 – 2018-07-24 (×42): 200 mg via INTRAVENOUS
  Filled 2018-07-03 (×55): qty 20

## 2018-07-03 MED ORDER — HEPARIN (PORCINE) 25000 UT/250ML-% IV SOLN
1150.0000 [IU]/h | INTRAVENOUS | Status: DC
  Administered 2018-07-03 – 2018-07-04 (×2): 1000 [IU]/h via INTRAVENOUS
  Filled 2018-07-03 (×2): qty 250

## 2018-07-03 MED ORDER — PROPOFOL 1000 MG/100ML IV EMUL
0.0000 ug/kg/min | INTRAVENOUS | Status: DC
  Administered 2018-07-03: 20 ug/kg/min via INTRAVENOUS
  Filled 2018-07-03: qty 100

## 2018-07-03 MED ORDER — TICAGRELOR 90 MG PO TABS
90.0000 mg | ORAL_TABLET | Freq: Two times a day (BID) | ORAL | Status: DC
  Administered 2018-07-03: 10:00:00 90 mg via ORAL
  Filled 2018-07-03: qty 1

## 2018-07-03 MED ORDER — SODIUM CHLORIDE 0.9 % IV SOLN
3.0000 g | Freq: Four times a day (QID) | INTRAVENOUS | Status: DC
  Administered 2018-07-03 – 2018-07-09 (×25): 3 g via INTRAVENOUS
  Filled 2018-07-03 (×28): qty 3

## 2018-07-03 MED ORDER — OSMOLITE 1.5 CAL PO LIQD
1000.0000 mL | ORAL | Status: DC
  Administered 2018-07-03 – 2018-07-09 (×5): 1000 mL
  Filled 2018-07-03 (×10): qty 1000

## 2018-07-03 MED ORDER — DEXMEDETOMIDINE HCL IN NACL 200 MCG/50ML IV SOLN
0.2000 ug/kg/h | INTRAVENOUS | Status: DC
  Administered 2018-07-03: 0.4 ug/kg/h via INTRAVENOUS
  Filled 2018-07-03: qty 50

## 2018-07-03 MED ORDER — THIAMINE HCL 100 MG/ML IJ SOLN
100.0000 mg | Freq: Every day | INTRAMUSCULAR | Status: DC
  Administered 2018-07-03 – 2018-07-09 (×7): 100 mg via INTRAVENOUS
  Filled 2018-07-03 (×7): qty 2

## 2018-07-03 MED ORDER — LEVETIRACETAM IN NACL 1000 MG/100ML IV SOLN
1000.0000 mg | Freq: Two times a day (BID) | INTRAVENOUS | Status: DC
  Administered 2018-07-03 – 2018-07-10 (×14): 1000 mg via INTRAVENOUS
  Filled 2018-07-03 (×14): qty 100

## 2018-07-03 MED ORDER — ATORVASTATIN CALCIUM 80 MG PO TABS: 80.0000 mg | ORAL_TABLET | Freq: Every day | ORAL | Status: DC

## 2018-07-03 MED ORDER — ATORVASTATIN CALCIUM 80 MG PO TABS
80.0000 mg | ORAL_TABLET | Freq: Every day | ORAL | Status: DC
  Administered 2018-07-03 – 2018-07-23 (×21): 80 mg
  Filled 2018-07-03 (×21): qty 1

## 2018-07-03 MED ORDER — ALBUTEROL SULFATE (2.5 MG/3ML) 0.083% IN NEBU: 2.5000 mg | INHALATION_SOLUTION | RESPIRATORY_TRACT | Status: DC | PRN

## 2018-07-03 MED ORDER — VALPROATE SODIUM 500 MG/5ML IV SOLN
300.0000 mg | Freq: Three times a day (TID) | INTRAVENOUS | Status: DC
  Administered 2018-07-03 – 2018-07-08 (×14): 300 mg via INTRAVENOUS
  Filled 2018-07-03 (×16): qty 3

## 2018-07-03 MED ORDER — LORAZEPAM 2 MG/ML IJ SOLN
1.0000 mg | INTRAMUSCULAR | Status: DC
  Administered 2018-07-03 – 2018-07-05 (×13): 1 mg via INTRAVENOUS
  Filled 2018-07-03 (×13): qty 1

## 2018-07-03 MED ORDER — HEPARIN BOLUS VIA INFUSION
3500.0000 [IU] | Freq: Once | INTRAVENOUS | Status: AC
  Administered 2018-07-03: 06:00:00 3500 [IU] via INTRAVENOUS
  Filled 2018-07-03: qty 3500

## 2018-07-03 MED ORDER — LEVETIRACETAM IN NACL 500 MG/100ML IV SOLN
500.0000 mg | Freq: Two times a day (BID) | INTRAVENOUS | Status: DC
  Administered 2018-07-03: 06:00:00 500 mg via INTRAVENOUS
  Filled 2018-07-03: qty 100

## 2018-07-03 MED FILL — Fentanyl Citrate Preservative Free (PF) Inj 100 MCG/2ML: INTRAMUSCULAR | Qty: 2 | Status: AC

## 2018-07-03 NOTE — Progress Notes (Addendum)
Subjective: No clinical seizure activity per RN.   Objective: Current vital signs: BP 115/82   Pulse (!) 106   Temp (!) 100.9 F (38.3 C)   Resp 16   Ht _0  (1.803 m)   Wt 62.1 kg   SpO2 98%   BMI 19.09 kg/m  Vital signs in last 24 hours: Temp:  [98.3 F (36.8 C)-100.9 F (38.3 C)] 100.9 F (38.3 C) (05/05 0800) Pulse Rate:  [90-141] 106 (05/05 0900) Resp:  [16-26] 16 (05/05 0900) BP: (111-225)/(75-129) 115/82 (05/05 0900) SpO2:  [93 %-100 %] 98 % (05/05 0900) FiO2 (%):  [40 %-100 %] 40 % (05/05 0744) Weight:  [62.1 kg-99.8 kg] 62.1 kg (05/05 0240)  Intake/Output from previous day: 05/04 0701 - 05/05 0700 In: 302.9 [I.V.:97.4; IV Piggyback:205.5] Out: 475 [Urine:475] Intake/Output this shift: Total I/O In: 43.2 [I.V.:43.2] Out: 275 [Urine:275] Nutritional status:  Diet Order            Diet NPO time specified  Diet effective now             General: Cachectic Lungs: Intubated  Neurologic Exam: Ment: Eyes closed. No response to verbal stimuli or loud clap. No purposeful movement.  CN: Pupils unreactive at 2 mm. No blink to threat. No doll's eye reflex. No grimace to noxious.  Motor/Sensory: Increased tone all 4 extremities.  Moves RUE to pinch 2/5.  No movement of LUE to any stimuli.  Moves ankles to noxious plantar stimulation with weak flexion at knees and hips (about 1-2 inches of movement). No definite asymmetry of BLE movement. Reflexes: Pathologically brisk reflexes throughout    Lab Results: Results for orders placed or performed during the hospital encounter of 07/25/2018 (from the past 48 hour(s))  MRSA PCR Screening     Status: None   Collection Time: 07/13/2018 11:38 PM  Result Value Ref Range   MRSA by PCR NEGATIVE NEGATIVE    Comment:        The GeneXpert MRSA Assay (FDA approved for NASAL specimens only), is one component of a comprehensive MRSA colonization surveillance program. It is not intended to diagnose MRSA infection nor to  guide or monitor treatment for MRSA infections. Performed at [facility], [place] [address], [place], [place] [address]   I-STAT 7, (LYTES, BLD GAS, ICA, H+H)     Status: Abnormal   Collection Time: 07/03/18 12:00 AM  Result Value Ref Range   pH, Arterial 7.325 (L) 7.350 - 7.450   pCO2 arterial 41.2 32.0 - 48.0 mmHg   pO2, Arterial 117.0 (H) 83.0 - 108.0 mmHg   Bicarbonate 21.4 20.0 - 28.0 mmol/L   TCO2 23 22 - 32 mmol/L   O2 Saturation 98.0 %   Acid-base deficit 4.0 (H) 0.0 - 2.0 mmol/L   Sodium 140 135 - 145 mmol/L   Potassium 3.9 3.5 - 5.1 mmol/L   Calcium, Ion 1.16 1.15 - 1.40 mmol/L   HCT 49.0 39.0 - 52.0 %   Hemoglobin 16.7 13.0 - 17.0 g/dL   Patient temperature 98.6 F    Collection site RADIAL, ALLEN'S TEST ACCEPTABLE    Drawn by Operator    Sample type ARTERIAL   Glucose, capillary     Status: Abnormal   Collection Time: 07/03/18 12:07 AM  Result Value Ref Range   Glucose-Capillary 127 (H) 70 - 99 mg/dL  Triglycerides     Status: None   Collection Time: 07/03/18  3:36 AM  Result Value Ref Range   Triglycerides  101 <150 mg/dL    Comment: Performed at [facility], [place] [address] [place], [place] [address]  Basic metabolic panel     Status: Abnormal   Collection Time: 07/03/18  3:36 AM  Result Value Ref Range   Sodium 140 135 - 145 mmol/L   Potassium 4.0 3.5 - 5.1 mmol/L   Chloride 106 98 - 111 mmol/L   CO2 22 22 - 32 mmol/L   Glucose, Bld 120 (H) 70 - 99 mg/dL   BUN 8 8 - 23 mg/dL   Creatinine, Ser 1.20 0.61 - 1.24 mg/dL   Calcium 8.5 (L) 8.9 - 10.3 mg/dL   GFR calc non Af Amer >60 >60 mL/min   GFR calc Af Amer >60 >60 mL/min   Anion gap 12 5 - 15    Comment: Performed at [facility], [place] [address] [place], [place] [address]  CBC     Status: Abnormal   Collection Time: 07/03/18  3:36 AM  Result Value Ref Range   WBC 11.8 (H) 4.0 - 10.5 K/uL   RBC 5.37 4.22 - 5.81 MIL/uL   Hemoglobin 16.1 13.0 - 17.0 g/dL   HCT 48.9 39.0 - 52.0 %   MCV  91.1 80.0 - 100.0 fL   MCH 30.0 26.0 - 34.0 pg   MCHC 32.9 30.0 - 36.0 g/dL   RDW 13.4 11.5 - 15.5 %   Platelets 168 150 - 400 K/uL   nRBC 0.0 0.0 - 0.2 %    Comment: Performed at [facility], [place] [address], [place], [place] [address]  Magnesium     Status: Abnormal   Collection Time: 07/03/18  3:36 AM  Result Value Ref Range   Magnesium 2.5 (H) 1.7 - 2.4 mg/dL    Comment: Performed at [facility] [address], [place], [place] [address]  Procalcitonin     Status: None   Collection Time: 07/03/18  3:36 AM  Result Value Ref Range   Procalcitonin 2.83 ng/mL    Comment:        Interpretation: PCT > 2 ng/mL: Systemic infection (sepsis) is likely, unless other causes are known. (NOTE)       Sepsis PCT Algorithm           Lower Respiratory Tract                                      Infection PCT Algorithm    ----------------------------     ----------------------------         PCT < 0.25 ng/mL                PCT < 0.10 ng/mL         Strongly encourage             Strongly discourage   discontinuation of antibiotics    initiation of antibiotics    ----------------------------     -----------------------------       PCT 0.25 - 0.50 ng/mL            PCT 0.10 - 0.25 ng/mL               OR       >80% decrease in PCT            Discourage initiation of  antibiotics      Encourage discontinuation           of antibiotics    ----------------------------     -----------------------------         PCT >= 0.50 ng/mL              PCT 0.26 - 0.50 ng/mL               AND       <80% decrease in PCT              Encourage initiation of                                             antibiotics       Encourage continuation           of antibiotics    ----------------------------     -----------------------------        PCT >= 0.50 ng/mL                  PCT > 0.50 ng/mL               AND         increase in PCT                  Strongly  encourage                                      initiation of antibiotics    Strongly encourage escalation           of antibiotics                                     -----------------------------                                           PCT <= 0.25 ng/mL                                                 OR                                        > 80% decrease in PCT                                     Discontinue / Do not initiate                                             antibiotics Performed at [facility], [place] [address], [place], [place] [address]   Phosphorus     Status: None   Collection Time: 07/03/18  3:36 AM  Result Value Ref Range   Phosphorus 3.2 2.5 - 4.6 mg/dL    Comment: Performed at [facility], [place] [address] [place], [place] [address]  Glucose, capillary     Status: Abnormal   Collection Time: 07/03/18  3:46 AM  Result Value Ref Range   Glucose-Capillary 125 (H) 70 - 99 mg/dL  Glucose, capillary     Status: Abnormal   Collection Time: 07/03/18  7:54 AM  Result Value Ref Range   Glucose-Capillary 147 (H) 70 - 99 mg/dL   Comment 1 Notify RN    Comment 2 Document in Chart     Recent Results (from the past 240 hour(s))  SARS Coronavirus 2 (CEPHEID- Performed in [facility] lab), Hosp Order     Status: None   Collection Time: 07/25/2018  7:26 PM  Result Value Ref Range Status   SARS Coronavirus 2 NEGATIVE NEGATIVE Final    Comment: (NOTE) If result is NEGATIVE SARS-CoV-2 target nucleic acids are NOT DETECTED. The SARS-CoV-2 RNA is generally detectable in upper and lower  respiratory specimens during the acute phase of infection. The lowest  concentration of SARS-CoV-2 viral copies this assay can detect is 250  copies / mL. A negative result does not preclude SARS-CoV-2 infection  and should not be used as the sole basis for treatment or other  patient management decisions.  A negative result may occur with  improper specimen collection /  handling, submission of specimen other  than nasopharyngeal swab, presence of viral mutation(s) within the  areas targeted by this assay, and inadequate number of viral copies  (<250 copies / mL). A negative result must be combined with clinical  observations, patient history, and epidemiological information. If result is POSITIVE SARS-CoV-2 target nucleic acids are DETECTED. The SARS-CoV-2 RNA is generally detectable in upper and lower  respiratory specimens dur ing the acute phase of infection.  Positive  results are indicative of active infection with SARS-CoV-2.  Clinical  correlation with patient history and other diagnostic information is  necessary to determine patient infection status.  Positive results do  not rule out bacterial infection or co-infection with other viruses. If result is PRESUMPTIVE POSTIVE SARS-CoV-2 nucleic acids MAY BE PRESENT.   A presumptive positive result was obtained on the submitted specimen  and confirmed on repeat testing.  While 2019 novel coronavirus  (SARS-CoV-2) nucleic acids may be present in the submitted sample  additional confirmatory testing may be necessary for epidemiological  and / or clinical management purposes  to differentiate between  SARS-CoV-2 and other Sarbecovirus currently known to infect humans.  If clinically indicated additional testing with an alternate test  methodology [phone]) is advised. The SARS-CoV-2 RNA is generally  detectable in upper and lower respiratory sp ecimens during the acute  phase of infection. The expected result is Negative. Fact Sheet for Patients:  StrictlyIdeas.no Fact Sheet for Healthcare Providers: BankingDealers.co.za This test is not yet approved or cleared by the [place] FDA and has been authorized for detection and/or diagnosis of SARS-CoV-2 by FDA under an Emergency Use Authorization (EUA).  This EUA will remain in effect (meaning this  test can be used) for the duration of the COVID-19 declaration under Section 564(b)(1) of the Act, 21 U.S.C. section 360bbb-3(b)(1), unless the authorization is terminated or revoked sooner. Performed at [facility], [address], [place], [place] [address]   MRSA PCR Screening     Status: None   Collection Time: 07/28/2018  11:38 PM  Result Value Ref Range Status   MRSA by PCR NEGATIVE NEGATIVE Final    Comment:        The GeneXpert MRSA Assay (FDA approved for NASAL specimens only), is one component of a comprehensive MRSA colonization surveillance program. It is not intended to diagnose MRSA infection nor to guide or monitor treatment for MRSA infections. Performed at [facility], [place] [address], [place], [place] [address]     Lipid Panel Recent Labs    07/03/18 0336  TRIG 101    Studies/Results: Ct Head Wo Contrast  Result Date: 07/10/2018 CLINICAL DATA:  Recent seizure activity EXAM: CT HEAD WITHOUT CONTRAST TECHNIQUE: Contiguous axial images were obtained from the base of the skull through the vertex without intravenous contrast. COMPARISON:  11/22/2007 FINDINGS: Brain: Mild atrophic changes are noted. 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 No findings to suggest acute hemorrhage, acute infarction or space-occupying mass lesion are noted. Vascular: No hyperdense vessel or unexpected calcification. Skull: Normal. Negative for fracture or focal lesion. Sinuses/Orbits: No acute finding. Other: None. IMPRESSION: Chronic changes without acute abnormality. Electronically Signed   By: Inez Catalina M.D.   On: 07/19/2018 18:35   Dg Chest Port 1 View  Result Date: 07/03/2018 CLINICAL DATA:  63 y/o  M; ET tube placement. EXAM: PORTABLE CHEST 1 VIEW COMPARISON:  07/05/2018 chest radiograph. FINDINGS: ET  tube tip projects 5 cm above the carina. Enteric tube tip extends below the field of view into the abdomen. Stable infiltrate at the right lung base. No pleural effusion or pneumothorax. Bones are unremarkable. IMPRESSION: ET tube tip projects 5 cm above the carina. Enteric tube tip extends below the field of view into the abdomen. Stable right basilar infiltrate. Electronically Signed   By: Kristine Garbe M.D.   On: 07/03/2018 00:06   Dg Chest Portable 1 View  Result Date: 07/12/2018 CLINICAL DATA:  Intubation, seizure EXAM: PORTABLE CHEST 1 VIEW COMPARISON:  Portable exam 1910 hours compared to 08/07/2012 FINDINGS: Endotracheal tube present with tip projecting 6.0 cm above carina. Nasogastric tube extends into stomach. Normal heart size, mediastinal contours, and pulmonary vascularity. Mild airspace infiltrate versus aspiration at RIGHT base. Remaining lungs clear. No pleural effusion or pneumothorax. IMPRESSION: Tube positions as above. Airspace infiltrates at RIGHT base question infiltrate versus aspiration. Electronically Signed   By: Lavonia Dana M.D.   On: 07/29/2018 19:55    Medications:  Scheduled: . atorvastatin  80 mg Oral q1800  . chlorhexidine gluconate (MEDLINE KIT)  15 mL Mouth Rinse BID  . folic acid  1 mg Intravenous Daily  . insulin aspart  0-9 Units Subcutaneous Q4H  . mouth rinse  15 mL Mouth Rinse 10 times per day  . pantoprazole (PROTONIX) IV  40 mg Intravenous Daily  . thiamine  100 mg Intravenous Daily  . ticagrelor  90 mg Oral BID   Continuous: . sodium chloride    . sodium chloride 10 mL/hr at 07/03/18 0900  . ampicillin-sulbactam (UNASYN) IV 3 g (07/03/18 8841)  . dexmedetomidine (PRECEDEX) IV infusion    . heparin 1,000 Units/hr (07/03/18 0900)  . levETIRAcetam 400 mL/hr at 07/03/18 6606    Assessment: 63 year old brought in from a group home after having seizure activity. Had witnessed tonic-clonic episode in the emergency room at [facility], for which he was given  benzodiazepines and loaded with Keppra. He had a total of 3 seizures without return to baseline, consistent with status epilepticus. Mentation did not return to baseline requiring to be intubation for airway protection. He was transferred to [facility] for higher level of care. Overall presentation most consistent with status epilepticus, now resolved, followed by prolonged postictal state.  1. He currently is off sedation. Exam consistent with a vegetative or minimally conscious state.  2. He has a history of EtOH abuse. Unclear when his last alcohol consumption was. Alcohol level was undetectable. 3. LTM EEG reviewed at the bedside. The record is slow and asymmetric, with higher amplitudes on the left. Frequent rhythmic delta activity is seen in the left sided leads.  4. He has a documented history of seizures on his chart but no antiepileptics listed on the chart. He was loaded with Keppra in the outside ER and given benzodiazepines to break the seizure. 5. Other considerations include stroke as he has a history of LV thrombus and on Coumadin 6. No clinical seizure activity on exam today.  7. Additional problem list includes leukocytosis and RLL infiltrate thought possibly to be due to aspiration.   Impression: --Status epilepticus-seizures secondary to alcohol withdrawal versus breakthrough seizure-limited history available --Evaluate for stroke given the history of LV thrombus  Recommendations: 1. Continue Keppra 500 twice daily (received a load in the ED) 2. Continue thiamine.  3. Continue LTM EEG. Await official read. May be able to discontinue LTM later today.  4. Medical management per primary team 5. Neurology will continue to follow 6. After EEG is discontinued, will need to obtain MRI brain. 7. Unasyn per ICU team for possible infection.  8. On heparin infusion for LV thrombus.   40 minutes spent in the neurological evaluation and  management of this critically ill patient.   Addendum:  LTM EEG report: This 5  hours of continuous EEG monitoring with simultaneous video monitoring recorded multiple electrographic seizures arising from left paracentral frontocentral and central parietal cortex.  Interictal epileptiform discharges were present involving the left hemisphere particularly left frontocentral central parietal cortex.  These findings suggestive of neuronal dysfunction and cortical irritability across left hemisphere particular left frontocentral central parietal cortex.    Plan:  -- Increasing Keppra to 1000 mg BID.  -- Adding valproic acid to his regimen with 30 mg/kg IV load followed by 5 mg IV TID -- Will monitor LTM EEG for improvement   LOS: 1 day   _0  signed: Dr. Kerney Elbe 07/03/2018  9:58 AM

## 2018-07-03 NOTE — Progress Notes (Signed)
EEG maint complete. No skin breakdown. Continue to monitor 

## 2018-07-03 NOTE — Progress Notes (Signed)
Pharmacy Antibiotic Note  Edward Cooper is a 63 y.o. male admitted on 07/04/2018 with seizures requiring intubation and now concern for aspiration PNA.  Pharmacy has been consulted for unasyn dosing.  Plan: Unasyn 3g IV every 6 hours Monitor renal function, clinical progression and LOT  Height: 5\' 11"  (180.3 cm) Weight: 136 lb 14.5 oz (62.1 kg) IBW/kg (Calculated) : 75.3  Temp (24hrs), Avg:99 F (37.2 C), Min:98.3 F (36.8 C), Max:99.7 F (37.6 C)  Recent Labs  Lab 07/22/2018 1809  WBC 12.1*  CREATININE 1.37*    Estimated Creatinine Clearance: 49.7 mL/min (A) (by C-G formula based on SCr of 1.37 mg/dL (H)).    No Known Allergies  Antimicrobials this admission: Unasyn 5/5>>  Dose adjustments this admission: n/a  Microbiology results: MRSA PCR negative  Daylene Posey, PharmD Clinical Pharmacist Please check AMION for all [facility] Pharmacy numbers 07/03/2018 3:56 AM

## 2018-07-03 NOTE — Procedures (Signed)
  Electroencephalogram report- LTM with VIDEO  Ordering Physician : Dr Jodi Mourning  Beginning time:  07/03/18 at 0312 Ending time:  07/03/18 at 0730 CPT/type : 95720  Day of study: day 1    Technical Description: The EEG was performed using standard setting per the guidelines of American Clinical Neurophysiology Society (ACNS).    A minimum of 21 electrodes were placed on scalp according to the International 10-20 or/and 10-10 Systems. Supplemental electrodes were placed as needed. Single EKG electrode was also used to detect cardiac arrhythmia. Patient's behavior was continuously recorded on video simultaneously with EEG. A minimum of 16 channels were used for data display. Each epoch of study was reviewed manually daily and as needed using standard referential and bipolar montages. Computerized quantitative EEG analysis (such as compressed spectral array analysis, trending, automated spike & seizure detection) were used as indicated.    Spike detection: ON  Seizure detection: ON   This 5 hours of continuous  EEG monitoring with simultaneous video monitoring was performed for this patient with convulsions to rule out clinical and subclinical geographic seizures  medications: As per EMR  There was no pushbutton activations events during this recording Background activities were marked by continuous reactive background activities predominantly in the theta range with superimposed faster frequencies.  Stage changes were present.  Superimposed left hemispheric sharp waves and spikes were present maximum negativity in a left frontocentral and central parietal cortex at times with negative field extending to the left frontal polar as well as right posterior paracentral cortex. Occasionally runs of poorly formed left periodic lateralized epileptiform discharges also present  Multiple electrographic seizures were present arising from left paracentral cortex remain confined to the left hemisphere.  No  obvious clinical accompaniment.  Clinical interpretation: This 5  hours of continuous EEG monitoring with simultaneous video monitoring recorded multiple electrographic seizures arising from left paracentral frontocentral and central parietal cortex.  Interictal epileptiform discharges were present involving the left hemisphere particularly left frontocentral central parietal cortex.  These findings suggestive of neuronal dysfunction and cortical irritability across left hemisphere particular left frontocentral central parietal cortex.  Continues monitoring is recommended to ensure resolution of seizures.  Clinical correlation is advised.

## 2018-07-03 NOTE — Progress Notes (Signed)
ANTICOAGULATION CONSULT NOTE - Initial Consult  Pharmacy Consult for heparin Indication: hx LV thrombus  No Known Allergies  Patient Measurements: Height: 5\' 11"  (180.3 cm) Weight: 136 lb 14.5 oz (62.1 kg) IBW/kg (Calculated) : 75.3 Heparin Dosing Weight: TBW  Vital Signs: Temp: 100.4 F (38 C) (05/05 1600) Temp Source: Bladder (05/05 1600) BP: 149/94 (05/05 1600) Pulse Rate: 110 (05/05 1600)  Labs: Recent Labs    07/27/2018 1809 07/03/18 0000 07/03/18 0336 07/03/18 0924 07/03/18 1552  HGB 17.3* 16.7 16.1  --   --   HCT 58.7* 49.0 48.9  --   --   PLT 306  --  168  --   --   LABPROT 15.6*  --   --   --   --   INR 1.3*  --   --   --   --   HEPARINUNFRC  --   --   --  0.56 0.44  CREATININE 1.37*  --  1.20  --   --   TROPONINI <0.03  --   --   --   --     Estimated Creatinine Clearance: 56.8 mL/min (by C-G formula based on SCr of 1.2 mg/dL).   Medical History: Past Medical History:  Diagnosis Date  . Alcoholism (HCC)   . Coronary artery disease    a. 08/07/12: anterior STEMI s/p DES x 2 to ostial LAD, thrombus extraction of mid LAD - c/b R groin hematoma/focal R femoral thrombus. b.  08/15/12: recurrent acute anterior STEMI with occlusion of LAD stent, (following cessation of integrellin prior to scheduled vascular surgery 08/15/12 for right femoral thrombectomy - later deferred), s/p PTCA/thrombectomy of LAD.   Marland Kitchen GERD (gastroesophageal reflux disease)   . Groin hematoma    a. R groin hematoma post-cath 07/2012, conservative management.  Marland Kitchen HTN (hypertension)    a. Soft BP 07/2012 limiting med titration.  . Hyperlipidemia   . Ischemic cardiomyopathy    a. EF 35-40% by echo 07/2012.  . LV (left ventricular) mural thrombus    a. By echo 07/2012 at time of STEMI.  Marland Kitchen NSVT (nonsustained ventricular tachycardia) (HCC)    a. Around time of STEMI 08/07/12.  . Seizures (HCC)   . Thrombus    a. R femoral, incidentally noted on CT after cath 07/2012 - Focal thrombus within the  distal common femoral artery and involving the proximal superficial femoral artery. Initially planned for surgery but this was deferred after 2nd MI from being off integrillin, DAPT. Med rx, OP f/u vascular.  . Tobacco use   . Ventricular fibrillation (HCC)    a. In cath lab at time of STEMI #2 08/15/12.    Assessment: 47 YOM presenting with seizures requiring intubation, on warfarin PTA for hx of LV thrombus, INR subtherapeutic on admission 1.3 and holding for now.    Heparin level remains therapeutic at 0.44 on 1000 units/hr. No infusion or bleeding issues noted. Hgb 16.1 pltc 168, cont to trend.    Goal of Therapy:  Heparin level 0.3-0.7 units/ml Monitor platelets by anticoagulation protocol: Yes   Plan:  Continue heparin infusion at 1000 units/hr Monitor daily heparin level, CBC, s/sx bleeding  Thank you for involving pharmacy in this patient's care.  Lenward Chancellor, PharmD PGY1 Pharmacy Resident 07/03/2018 4:51 PM

## 2018-07-03 NOTE — Progress Notes (Signed)
ANTICOAGULATION CONSULT NOTE - Initial Consult  Pharmacy Consult for heparin Indication: hx LV thrombus  No Known Allergies  Patient Measurements: Height: 5\' 11"  (180.3 cm) Weight: 136 lb 14.5 oz (62.1 kg) IBW/kg (Calculated) : 75.3 Heparin Dosing Weight: TBW  Vital Signs: Temp: 100.9 F (38.3 C) (05/05 0800) Temp Source: Axillary (05/05 0400) BP: 115/Edward (05/05 0900) Pulse Rate: 106 (05/05 0900)  Labs: Recent Labs    Jul 11, 2018 1809 07/03/18 0000 07/03/18 0336 07/03/18 0924  HGB 17.3* 16.7 16.1  --   HCT 58.7* 49.0 48.9  --   PLT 306  --  168  --   LABPROT 15.6*  --   --   --   INR 1.3*  --   --   --   HEPARINUNFRC  --   --   --  0.56  CREATININE 1.37*  --  1.20  --   TROPONINI <0.03  --   --   --     Estimated Creatinine Clearance: 56.8 mL/min (by C-G formula based on SCr of 1.2 mg/dL).   Medical History: Past Medical History:  Diagnosis Date  . Alcoholism (HCC)   . Coronary artery disease    a. 08/07/12: anterior STEMI s/p DES x 2 to ostial LAD, thrombus extraction of mid LAD - c/b R groin hematoma/focal R femoral thrombus. b.  08/15/12: recurrent acute anterior STEMI with occlusion of LAD stent, (following cessation of integrellin prior to scheduled vascular surgery 08/15/12 for right femoral thrombectomy - later deferred), s/p PTCA/thrombectomy of LAD.   Marland Kitchen GERD (gastroesophageal reflux disease)   . Groin hematoma    a. R groin hematoma post-cath 07/2012, conservative management.  Marland Kitchen HTN (hypertension)    a. Soft BP 07/2012 limiting med titration.  . Hyperlipidemia   . Ischemic cardiomyopathy    a. EF 35-40% by echo 07/2012.  . LV (left ventricular) mural thrombus    a. By echo 07/2012 at time of STEMI.  Marland Kitchen NSVT (nonsustained ventricular tachycardia) (HCC)    a. Around time of STEMI 08/07/12.  . Seizures (HCC)   . Thrombus    a. R femoral, incidentally noted on CT after cath 07/2012 - Focal thrombus within the distal common femoral artery and involving the proximal  superficial femoral artery. Initially planned for surgery but this was deferred after 2nd MI from being off integrillin, DAPT. Med rx, OP f/u vascular.  . Tobacco use   . Ventricular fibrillation (HCC)    a. In cath lab at time of STEMI #2 08/15/12.    Assessment: Edward Cooper presenting with seizures requiring intubation, on warfarin PTA for hx of LV thrombus, INR subtherapeutic on admission 1.3 and holding for now.    5/5 AM: Heparin infusion this morning currently at 1000 units/hr. Heparin level currently at goal 0.56. No infusion issues noted. Hgb 16.1 pltc 168, cont to trend.    Goal of Therapy:  Heparin level 0.3-0.7 units/ml Monitor platelets by anticoagulation protocol: Yes   Plan:  Continue heparin infusion at 1000 units/hr Confirmatory heparin level at 1600  Thank you for involving pharmacy in this patient's care.  Wendelyn Breslow, PharmD PGY1 Pharmacy Resident Phone: [phone] 07/03/2018 10:38 AM

## 2018-07-03 NOTE — Progress Notes (Addendum)
Prelim EEG - with some asymmetric slowing in addition  of generalized slowing. No seizures Will monitor on LTM. Neurology will follow.  -- Milon Dikes, MD [facility] Pager: [phone] If 7pm to 7am, please call on call as listed on AMION.

## 2018-07-03 NOTE — Progress Notes (Signed)
Initial Nutrition Assessment  RD working remotely.  DOCUMENTATION CODES:   Not applicable  INTERVENTION:   Initiate enteral nutrition via OGT: - Start Osmolite 1.5 @ 20 ml/hr and increase rate by 10 ml/hr q 4 hours until goal rate of 50 ml/hr (1200 ml/day) - Pro-stat 30 ml daily  Tube feeding regimen at goal rate provides 1900 kcal, 90 grams of protein, and 914 ml of H2O (101% of kcal needs, 100% of protein needs).  NUTRITION DIAGNOSIS:   Inadequate oral intake related to inability to eat as evidenced by NPO status.  GOAL:   Patient will meet greater than or equal to 90% of their needs  MONITOR:   Vent status, Labs, Weight trends  REASON FOR ASSESSMENT:   Ventilator    ASSESSMENT:   63 year old male who presented to the [facility] ED on 5/[facility] after having a witnessed seizure. PMH of EtOH abuse, CAD, HTN, HLD, cardiomyopathy, GERD, and seizure disorder. Pt intubated in the ED and transferred to [facility]  Per CCM note this morning, SBT when awake with goal extubation. CCM suspecting alcohol withdrawal seizures.  Per Neurology note, "overall presentation most consistent with status epilepticus, now resolved, followed by prolonged postictal state."  Pt is currently off sedation. OGT in place.  Discussed pt with RN and MD. Pt will not be extubating today. Verbal with readback order placed to start enteral nutrition per R. Vassie Loll, MD. Given lower weight and history of EtOH abuse, will start TF at 20 ml/hr and increase rate by 10 ml/hr q 4 hours to goal of 50 ml/hr.  Reviewed weight history in chart. The only weights available PTA are from 2014.  Patient is currently intubated on ventilator support MV: 8.9 L/min Temp (24hrs), Avg:99.6 F (37.6 C), Min:98.3 F (36.8 C), Max:101.3 F (38.5 C) BP: 117/71 MAP: 85  Medications reviewed and include: folic acid, SSI, Protonix, thiamine, NS @ 10 ml/hr, IV abx, heparin, Depacon, Keppra  Labs reviewed: magnesium 2.5 (H) CBG's: 178, 147,  125 x 12 hours  NUTRITION - FOCUSED PHYSICAL EXAM:  Unable to complete at this time. RD working remotely.  Diet Order:   Diet Order            Diet NPO time specified  Diet effective now              EDUCATION NEEDS:   No education needs have been identified at this time  Skin:  Skin Assessment: Reviewed RN Assessment  Last BM:  no documented BM  Height:   Ht Readings from Last 1 Encounters:  Jul 28, 2018 5\' 11"  (1.803 m)    Weight:   Wt Readings from Last 1 Encounters:  07/03/18 62.1 kg    Ideal Body Weight:  78.2 kg  BMI:  Body mass index is 19.09 kg/m.  Estimated Nutritional Needs:   Kcal:  1887  Protein:  90-105 grams  Fluid:  >/= 1.9 L    Earma Reading, MS, RD, LDN Inpatient Clinical Dietitian Pager: [phone] Weekend/After Hours: [phone]

## 2018-07-03 NOTE — Progress Notes (Signed)
Communicated with Dr. Jobe Gibbon regarding most recent EEG tracings. The patient's seizures have decreased from 1 approximately every 5 minutes to 5-6 per hour following Vimpat 200 mg IV. To achieve further improvement in seizure frequency, Ativan 1 mg IV q4h has been started. This will later be switched to clobazam (Onfi) via NGT once seizures stabilize, if a benzodiazepine is still required (clobazam highly effective for seizure control at a total daily dose of 1 mg/kg while being less sedating than other benzos).   Electronically signed: Dr. Caryl Pina

## 2018-07-03 NOTE — Progress Notes (Signed)
EEG Complete  Results Pending 

## 2018-07-03 NOTE — Procedures (Signed)
ELECTROENCEPHALOGRAM REPORT   Patient: Edward Cooper       Room #: 3F57D EEG No. ID: [id number] Age: 63 y.o.        Sex: male Referring Physician: Marcos Eke Report Date:  07/03/2018        Interpreting Physician: Thana Farr  History: DEMOSTHENES HULME is an 63 y.o. male presenting with seizures  Medications:  Unasyn, Lipitor, Insulin, Folic, acid, Keppra, Thiamine, Brilinta, Precedex, Heparin  Conditions of Recording:  This is a 21 channel routine scalp EEG performed with bipolar and monopolar montages arranged in accordance to the international 10/20 system of electrode placement. One channel was dedicated to EKG recording.  The patient is in the intubated and sedated state.  Description:  The background activity is asymmetric.  Both hemispheres show slowing of the background rhythm due to an underlying, low voltage, polymorphic delta activity.  This slow activity has a superimposed beta activity that is more prominent over the left hemisphere as compared to the right.  The activity over the right hemisphere is also of lower voltage than that seen over the left.  Some intermittent, embedded sharp waves are noted over the left hemisphere that are not noted over the right hemisphere as well.   Hyperventilation and intermittent photic stimulation were not performed  IMPRESSION: This is an abnormal electroencephalogram secondary to hemispheric asymmetry with the right hemisphere being attenuated and slow compared to the left hemisphere suggestive of a focal abnormality.  Also noted is intermittent left hemispheric sharp waves.     Thana Farr, MD Neurology [phone] 07/03/2018, 9:34 AM

## 2018-07-03 NOTE — Progress Notes (Signed)
NAME:  Edward Cooper, MRN:  [id number], DOB:  09/13/1955, LOS: 1 ADMISSION DATE:  07/19/2018, CONSULTATION DATE:  07/20/2018 REFERRING MD:  [facility], CHIEF COMPLAINT:  seizures  Brief History   [age] yoM presenting with witnessed seizures x 3, did not return to baseline and required intubation for airway protection.  [facility] for [facility].    History of present illness   HPI obtained from medical chart review as patient is intubated on mechanical ventilation.   63 year old male with history of CAD s/p PCI, LV thrombus on coumadin,  PVD, GERD, HTN, HLD, ICM, seizures, tobacco and ETOH abuse who presented from his boarding house with EMS for reported seizure [facility] ED.  It appears patient is not on any home anticonvulsants.   Reportedly add focal seizure with EMS and noted to be hypertensive 224/124 given ativan. He remained postictal.  Shortly after arrival to ER, he had another seizure, given ativan.  Required NRB to maintain saturations.  CT head was negative for bleed or abnormality.  Loaded with keppra.  Labs noted for INR 1.3, glucose 213, WBC 12, UA neg, UDS positive for THC.  Had ongoing focal seizure activity and unresponsive requiring intubation. CXR with right base infiltrate.  [facility] with Neurology consulting for continuous EEG, PCCM to admit.   Past Medical History  CAD s/p PCI, LV thrombus on coumadin,  PVD, GERD, HTN, HLD, ICM, seizures, tobacco and ETOH abuse  Significant Hospital Events   5/4 Admit  Consults:  Neurology   Procedures:  5/4 ETT >>  Significant Diagnostic Tests:  [facility] >> chronic changes, no acute abnormality  5/5 EEG >>  Micro Data:  5/4 MRSA PCR  >>  Antimicrobials:  5/5 unasyn >>  Interim history/subjective:   Critically ill, intubated Propofol turned off this morning On LTM EEG monitoring Afebrile  Objective   Blood pressure 127/81, pulse (!) 105, temperature 98.4 F (36.9 C), temperature  source Axillary, resp. rate 16, height 5\' 11"  (1.803 m), weight 62.1 kg, SpO2 100 %.    Vent Mode: PRVC FiO2 (%):  [50 %-100 %] 50 % Set Rate:  [16 bmp-20 bmp] 16 bmp Vt Set:  [450 mL-600 mL] 600 mL PEEP:  [5 cmH20] 5 cmH20 Plateau Pressure:  [15 cmH20-17 cmH20] 15 cmH20   Intake/Output Summary (Last 24 hours) at 07/03/2018 [id number] Last data filed at 07/03/2018 [id number] Gross per 24 hour  Intake 302.9 ml  Output 600 ml  Net -297.1 ml   Filed Weights   07/03/18 0000 07/03/18 0240  Weight: 62.1 kg 62.1 kg    Examination: General:  Critically ill thin adult male on MV  HEENT: MM pink/moist, ETT at 24 cm, OGT, pupils 3/sluggish Neuro: Propofol turned off, does not follow commands, pupils bilateral pinpoint, no meningeal signs CV: SR, no murmur  PULM: even/non-labored, lungs bilaterally clear GI: soft, non-distended, bs active, foley with CYU Extremities: warm/dry, no LE edema  Skin: no rashes  Chest x-ray 5/5 personally reviewed which shows ET tube in position and right lower lobe infiltrate  Resolved Hospital Problem list    Assessment & Plan:   Acute encephalopathy likely postictal No evidence of status on LTM EEG, favor alcohol withdrawal seizures - THC positive  Hx ETOH abuse P:  Appreciate Neurology assistance Keppra per neurology Ativan per CIWA protocol Use Precedex if extreme agitation Seizure precautions Daily thiamine/ folate   Acute respiratory failure RLL infiltrate/ probable aspiration  P:  Spontaneous breathing trials when awake with goal extubation VAP bundle Prn albuterol    Leukocytosis RLL infiltrate / ?aspiration - UA ok P:  Empiric unasyn , send respiratory culture Trend WBC / fever curve   AKI  P:  IV fluids Replace electrolytes as indicated Avoid nephrotoxic agents, ensure adequate renal perfusion  HTN HLD - neg trop/ normal EKG P:  Hold home coreg/ lisinopril Continue lipitor  CAD s/p PCI, LV thrombus on coumadin,  PVD, ICM (EF  35-40% 2014) - INR 1.3 P:  Continue brilinta Hold coumadin, heparin per pharmacy  Consider repeat echo and cardiology input once acute issues resolved  Hyperglycemia P:  SSI sensitive CBG q 4  Depression P:  Holding home zoloft   Best practice:  Diet: NPO Pain/Anxiety/Delirium protocol (if indicated): RA SS goal 0, Celexa if needed VAP protocol (if indicated): yes DVT prophylaxis: heparin gtt GI prophylaxis: PPI Glucose control: SSI sensitive Mobility: BR Code Status: Full  Family Communication: none available Disposition: ICU  The patient is critically ill with multiple organ systems failure and requires high complexity decision making for assessment and support, frequent evaluation and titration of therapies, application of advanced monitoring technologies and extensive interpretation of multiple databases. Critical Care Time devoted to patient care services described in this note independent of APP/resident  time is 32 minutes.   Cyril Mourning MD. Tonny Bollman. [facility] Pager [phone] If no response call [phone]     07/03/2018, 8:34 AM

## 2018-07-03 NOTE — Progress Notes (Signed)
RN spoke with Cline Crock, patients' cousin. RN informed by Ms. Lorenda Hatchet that patient has lived with him over the past two years since his girlfriend expired. Per Ms. Lorenda Hatchet, another local cousin, Leroyal Laramore, is available for contact. Patient has several brothers and sisters and a father. RN able to obtain Fredirick Maudlin, who lives in [place], and added to patient's emergency contact information. Patient stable. RN will continue to monitor.

## 2018-07-03 NOTE — Progress Notes (Signed)
LTM started Dr Arora notified 

## 2018-07-03 NOTE — Progress Notes (Signed)
Wasted 25 mL versed in Producer, television/film/video w/ Francesca Jewett RN.

## 2018-07-03 NOTE — Progress Notes (Signed)
ANTICOAGULATION CONSULT NOTE - Initial Consult  Pharmacy Consult for heparin Indication: hx LV thrombus  No Known Allergies  Patient Measurements: Height: 5\' 11"  (180.3 cm) Weight: 136 lb 14.5 oz (62.1 kg) IBW/kg (Calculated) : 75.3 Heparin Dosing Weight: TBW  Vital Signs: Temp: 98.3 F (36.8 C) (05/05 0000) Temp Source: Axillary (05/05 0000) BP: 115/80 (05/05 0200) Pulse Rate: 100 (05/05 0200)  Labs: Recent Labs    07-25-18 1809 07/03/18 0000  HGB 17.3* 16.7  HCT 58.7* 49.0  PLT 306  --   LABPROT 15.6*  --   INR 1.3*  --   CREATININE 1.37*  --   TROPONINI <0.03  --     Estimated Creatinine Clearance: 49.7 mL/min (A) (by C-G formula based on SCr of 1.37 mg/dL (H)).   Medical History: Past Medical History:  Diagnosis Date  . Alcoholism (HCC)   . Coronary artery disease    a. 08/07/12: anterior STEMI s/p DES x 2 to ostial LAD, thrombus extraction of mid LAD - c/b R groin hematoma/focal R femoral thrombus. b.  08/15/12: recurrent acute anterior STEMI with occlusion of LAD stent, (following cessation of integrellin prior to scheduled vascular surgery 08/15/12 for right femoral thrombectomy - later deferred), s/p PTCA/thrombectomy of LAD.   Marland Kitchen GERD (gastroesophageal reflux disease)   . Groin hematoma    a. R groin hematoma post-cath 07/2012, conservative management.  Marland Kitchen HTN (hypertension)    a. Soft BP 07/2012 limiting med titration.  . Hyperlipidemia   . Ischemic cardiomyopathy    a. EF 35-40% by echo 07/2012.  . LV (left ventricular) mural thrombus    a. By echo 07/2012 at time of STEMI.  Marland Kitchen NSVT (nonsustained ventricular tachycardia) (HCC)    a. Around time of STEMI 08/07/12.  . Seizures (HCC)   . Thrombus    a. R femoral, incidentally noted on CT after cath 07/2012 - Focal thrombus within the distal common femoral artery and involving the proximal superficial femoral artery. Initially planned for surgery but this was deferred after 2nd MI from being off integrillin, DAPT.  Med rx, OP f/u vascular.  . Tobacco use   . Ventricular fibrillation (HCC)    a. In cath lab at time of STEMI #2 08/15/12.    Assessment: 76 YOM presenting with seizures requiring intubation, on warfarin PTA for hx of LV thrombus, INR subtherapeutic on admission 1.3 and holding for now.  CBC wnl.   Goal of Therapy:  Heparin level 0.3-0.7 units/ml Monitor platelets by anticoagulation protocol: Yes   Plan:  Heparin 3500 units IV x1, and gtt at 1000 units/hr F/u 6 hour heparin level  Daylene Posey, PharmD Clinical Pharmacist Please check AMION for all [facility] Pharmacy numbers 07/03/2018 3:49 AM

## 2018-07-03 NOTE — Progress Notes (Signed)
Per Dr. Jobe Gibbon' follow up evaluation of the LTM EEG, the patient continues to have electrographic seizures on EEG, despite being loaded with valproic acid. He had his last seizure at 1415.   Plan:  -- Adding Vimpat to the patient's regimen at 200 mg IV BID, first dose now. -- Continue Keppra at 1000 mg BID, which was increased today.  -- Continue valproic acid at 5 mg/kg IV TID. Obtaining level in the AM.  -- Continue LTM EEG. If seizure frequency does not improve significantly may need to start burst suppression protocol with Versed gtt.   Electronically signed: Dr. Caryl Pina

## 2018-07-04 ENCOUNTER — Inpatient Hospital Stay (HOSPITAL_COMMUNITY): Payer: Medicare Other

## 2018-07-04 DIAGNOSIS — J9600 Acute respiratory failure, unspecified whether with hypoxia or hypercapnia: Secondary | ICD-10-CM

## 2018-07-04 DIAGNOSIS — G40901 Epilepsy, unspecified, not intractable, with status epilepticus: Secondary | ICD-10-CM

## 2018-07-04 DIAGNOSIS — F10239 Alcohol dependence with withdrawal, unspecified: Secondary | ICD-10-CM

## 2018-07-04 LAB — GLUCOSE, CAPILLARY
Glucose-Capillary: 104 mg/dL — ABNORMAL HIGH (ref 70–99)
Glucose-Capillary: 121 mg/dL — ABNORMAL HIGH (ref 70–99)
Glucose-Capillary: 146 mg/dL — ABNORMAL HIGH (ref 70–99)
Glucose-Capillary: 149 mg/dL — ABNORMAL HIGH (ref 70–99)
Glucose-Capillary: 163 mg/dL — ABNORMAL HIGH (ref 70–99)
Glucose-Capillary: 92 mg/dL (ref 70–99)

## 2018-07-04 LAB — BASIC METABOLIC PANEL
Anion gap: 14 (ref 5–15)
BUN: 12 mg/dL (ref 8–23)
CO2: 20 mmol/L — ABNORMAL LOW (ref 22–32)
Calcium: 8.3 mg/dL — ABNORMAL LOW (ref 8.9–10.3)
Chloride: 103 mmol/L (ref 98–111)
Creatinine, Ser: 1.05 mg/dL (ref 0.61–1.24)
GFR calc Af Amer: >60 mL/min (ref >60)
GFR calc non Af Amer: >60 mL/min (ref >60)
Glucose, Bld: 129 mg/dL — ABNORMAL HIGH (ref 70–99)
Potassium: 4.1 mmol/L (ref 3.5–5.1)
Sodium: 137 mmol/L (ref 135–145)

## 2018-07-04 LAB — ECHOCARDIOGRAM COMPLETE
Height: 71 in
Weight: 2172.85 oz

## 2018-07-04 LAB — CBC
HCT: 43.6 % (ref 39.0–52.0)
Hemoglobin: 14.6 g/dL (ref 13.0–17.0)
MCH: 30.4 pg (ref 26.0–34.0)
MCHC: 33.5 g/dL (ref 30.0–36.0)
MCV: 90.6 fL (ref 80.0–100.0)
Platelets: 154 10*3/uL (ref 150–400)
RBC: 4.81 MIL/uL (ref 4.22–5.81)
RDW: 13.5 % (ref 11.5–15.5)
WBC: 12.9 10*3/uL — ABNORMAL HIGH (ref 4.0–10.5)
nRBC: 0 % (ref 0.0–0.2)

## 2018-07-04 LAB — PHOSPHORUS: Phosphorus: 2.2 mg/dL — ABNORMAL LOW (ref 2.5–4.6)

## 2018-07-04 LAB — VALPROIC ACID LEVEL: Valproic Acid Lvl: 64 ug/mL (ref 50.0–100.0)

## 2018-07-04 LAB — PROTIME-INR
INR: 1.4 — ABNORMAL HIGH (ref 0.8–1.2)
Prothrombin Time: 16.7 seconds — ABNORMAL HIGH (ref 11.4–15.2)

## 2018-07-04 LAB — HEPARIN LEVEL (UNFRACTIONATED): Heparin Unfractionated: 0.22 IU/mL — ABNORMAL LOW (ref 0.30–0.70)

## 2018-07-04 LAB — TRIGLYCERIDES: Triglycerides: 56 mg/dL (ref <150)

## 2018-07-04 LAB — APTT: aPTT: 84 seconds — ABNORMAL HIGH (ref 24–36)

## 2018-07-04 LAB — MAGNESIUM: Magnesium: 2.4 mg/dL (ref 1.7–2.4)

## 2018-07-04 MED ORDER — MIDAZOLAM 50MG/50ML (1MG/ML) PREMIX INFUSION
50.0000 mg/h | INTRAVENOUS | Status: DC
  Administered 2018-07-04 – 2018-07-05 (×5): 12 mg/h via INTRAVENOUS
  Administered 2018-07-05 (×2): 30 mg/h via INTRAVENOUS
  Administered 2018-07-05: 22:00:00 50 mg/h via INTRAVENOUS
  Administered 2018-07-05 (×3): 12 mg/h via INTRAVENOUS
  Filled 2018-07-04 (×3): qty 50
  Filled 2018-07-04: qty 100
  Filled 2018-07-04 (×7): qty 50

## 2018-07-04 MED ORDER — POTASSIUM & SODIUM PHOSPHATES 280-160-250 MG PO PACK
2.0000 | PACK | Freq: Four times a day (QID) | ORAL | Status: AC
  Administered 2018-07-04 (×2): 2 via ORAL
  Filled 2018-07-04 (×2): qty 2

## 2018-07-04 MED ORDER — PERFLUTREN LIPID MICROSPHERE
INTRAVENOUS | Status: AC
  Administered 2018-07-04: 09:00:00 3 mL via INTRAVENOUS
  Filled 2018-07-04: qty 10

## 2018-07-04 MED ORDER — PROPOFOL 1000 MG/100ML IV EMUL
INTRAVENOUS | Status: AC
  Filled 2018-07-04: qty 100

## 2018-07-04 MED ORDER — MIDAZOLAM BOLUS VIA INFUSION
12.0000 mg | Freq: Once | INTRAVENOUS | Status: AC
  Administered 2018-07-04: 12 mg via INTRAVENOUS
  Filled 2018-07-04: qty 12

## 2018-07-04 MED ORDER — PROPOFOL 1000 MG/100ML IV EMUL
10.0000 ug/kg/min | INTRAVENOUS | Status: DC
  Administered 2018-07-04: 10 ug/kg/min via INTRAVENOUS

## 2018-07-04 MED ORDER — NOREPINEPHRINE 4 MG/250ML-% IV SOLN
INTRAVENOUS | Status: AC
  Filled 2018-07-04: qty 250

## 2018-07-04 MED ORDER — PERFLUTREN LIPID MICROSPHERE
1.0000 mL | INTRAVENOUS | Status: AC | PRN
  Administered 2018-07-04: 09:00:00 3 mL via INTRAVENOUS
  Filled 2018-07-04: qty 10

## 2018-07-04 MED ORDER — NOREPINEPHRINE 4 MG/250ML-% IV SOLN
0.0000 ug/min | INTRAVENOUS | Status: DC
  Administered 2018-07-04 – 2018-07-09 (×2): 2 ug/min via INTRAVENOUS
  Filled 2018-07-04: qty 250

## 2018-07-04 NOTE — Progress Notes (Signed)
Foley catheter stopped working over the night. Bladder scan at 0200 showed around in bladder. Decision was made to pull foley catheter at 0600. When balloon was deflated, blood pooled into the catheter tube. When pulled out, there was bleeding noticed coming out of the urethra. An external catheter was placed and patient was bladder scanned a second time. The second volume was around . Immediately after scanning, patient voided into external catheter . eLink MD made aware. Verbal order to stop heparin gtt. Will continue to monitor.

## 2018-07-04 NOTE — Progress Notes (Signed)
Subjective: On LTM. Propofol at a rate of 10 was started overnight by Dr. Rory Percy for frequent electrographic discharges on EEG. Per his note there was continued left sided irritability with possible intermittent sharps. No clinical seizure activity per nursing, but EEG shows recurrent semirhythmic electrographic discharges.   Objective: Current vital signs: BP 103/69   Pulse 91   Temp (!) 97.2 F (36.2 C) (Axillary)   Resp 16   Ht _0  (1.803 m)   Wt 61.6 kg   SpO2 100%   BMI 18.94 kg/m  Vital signs in last 24 hours: Temp:  [97.2 F (36.2 C)-101.3 F (38.5 C)] 97.2 F (36.2 C) (05/06 0800) Pulse Rate:  [80-112] 91 (05/06 1000) Resp:  [12-23] 16 (05/06 1000) BP: (71-149)/(53-107) 103/69 (05/06 1000) SpO2:  [98 %-100 %] 100 % (05/06 1000) FiO2 (%):  [40 %] 40 % (05/06 0800) Weight:  [61.6 kg] 61.6 kg (05/06 0500)  Intake/Output from previous day: 05/05 0701 - 05/06 0700 In: 1790 [I.V.:463.6; NG/GT:467; IV Piggyback:859.3] Out: 1350 [Urine:1350] Intake/Output this shift: Total I/O In: 364.7 [I.V.:69.3; NG/GT:270; IV Piggyback:25.5] Out: -  Nutritional status:  Diet Order            Diet NPO time specified  Diet effective now             HEENT: [place]/AT Lungs: Intubated Ext: Decreased muscle bulk diffusely. No edema  Neurologic Exam: Ment: Eyes closed. No response to verbal stimuli or loud clap. No purposeful movement.  CN: Pupils unreactive at 2 mm. No blink to threat. No doll's eye reflex. Normal corneal reflex on right, weak corneal reflex on left. No grimace to noxious.  Motor/Sensory: Increased extensor tone all 4 extremities, worse on the right.  Moves RUE to pinch 2/5.  No movement of LUE to any stimuli.  Weakly withdraws BLE to plantar stimulation, more briskly on the right  Reflexes: Pathologically brisk reflexes throughout, brisker on right than left.  Right toe upgoing, left toe mute.    Lab Results: Results for orders placed or performed during the  hospital encounter of 07/15/2018 (from the past 48 hour(s))  MRSA PCR Screening     Status: None   Collection Time: 07/17/2018 11:38 PM  Result Value Ref Range   MRSA by PCR NEGATIVE NEGATIVE    Comment:        The GeneXpert MRSA Assay (FDA approved for NASAL specimens only), is one component of a comprehensive MRSA colonization surveillance program. It is not intended to diagnose MRSA infection nor to guide or monitor treatment for MRSA infections. Performed at [facility], [place] [address] [place], [place] [address]   I-STAT 7, (LYTES, BLD GAS, ICA, H+H)     Status: Abnormal   Collection Time: 07/03/18 12:00 AM  Result Value Ref Range   pH, Arterial 7.325 (L) 7.350 - 7.450   pCO2 arterial 41.2 32.0 - 48.0 mmHg   pO2, Arterial 117.0 (H) 83.0 - 108.0 mmHg   Bicarbonate 21.4 20.0 - 28.0 mmol/L   TCO2 23 22 - 32 mmol/L   O2 Saturation 98.0 %   Acid-base deficit 4.0 (H) 0.0 - 2.0 mmol/L   Sodium 140 135 - 145 mmol/L   Potassium 3.9 3.5 - 5.1 mmol/L   Calcium, Ion 1.16 1.15 - 1.40 mmol/L   HCT 49.0 39.0 - 52.0 %   Hemoglobin 16.7 13.0 - 17.0 g/dL   Patient temperature 98.6 F    Collection site RADIAL, ALLEN'S TEST ACCEPTABLE  Drawn by Operator    Sample type ARTERIAL   Glucose, capillary     Status: Abnormal   Collection Time: 07/03/18 12:07 AM  Result Value Ref Range   Glucose-Capillary 127 (H) 70 - 99 mg/dL  Triglycerides     Status: None   Collection Time: 07/03/18  3:36 AM  Result Value Ref Range   Triglycerides 101 <150 mg/dL    Comment: Performed at [facility], [address], [place], [place] [address]  Basic metabolic panel     Status: Abnormal   Collection Time: 07/03/18  3:36 AM  Result Value Ref Range   Sodium 140 135 - 145 mmol/L   Potassium 4.0 3.5 - 5.1 mmol/L   Chloride 106 98 - 111 mmol/L   CO2 22 22 - 32 mmol/L   Glucose, Bld 120 (H) 70 - 99 mg/dL   BUN 8 8 - 23 mg/dL   Creatinine, Ser 1.20 0.61 - 1.24 mg/dL   Calcium 8.5 (L) 8.9 - 10.3  mg/dL   GFR calc non Af Amer >60 >60 mL/min   GFR calc Af Amer >60 >60 mL/min   Anion gap 12 5 - 15    Comment: Performed at [facility], [place] [address], [place], [place] [address]  CBC     Status: Abnormal   Collection Time: 07/03/18  3:36 AM  Result Value Ref Range   WBC 11.8 (H) 4.0 - 10.5 K/uL   RBC 5.37 4.22 - 5.81 MIL/uL   Hemoglobin 16.1 13.0 - 17.0 g/dL   HCT 48.9 39.0 - 52.0 %   MCV 91.1 80.0 - 100.0 fL   MCH 30.0 26.0 - 34.0 pg   MCHC 32.9 30.0 - 36.0 g/dL   RDW 13.4 11.5 - 15.5 %   Platelets 168 150 - 400 K/uL   nRBC 0.0 0.0 - 0.2 %    Comment: Performed at [facility], [place] [address] [place], [place] [address]  HIV Antibody (routine testing w rflx)     Status: None   Collection Time: 07/03/18  3:36 AM  Result Value Ref Range   HIV Screen 4th Generation wRfx Non Reactive Non Reactive    Comment: (NOTE) Performed At: [facility] [place], [place] [id number] Rush Farmer MD KG:[id number]   Magnesium     Status: Abnormal   Collection Time: 07/03/18  3:36 AM  Result Value Ref Range   Magnesium 2.5 (H) 1.7 - 2.4 mg/dL    Comment: Performed at [facility], [place] [address], [place],  [address]  Procalcitonin     Status: None   Collection Time: 07/03/18  3:36 AM  Result Value Ref Range   Procalcitonin 2.83 ng/mL    Comment:        Interpretation: PCT > 2 ng/mL: Systemic infection (sepsis) is likely, unless other causes are known. (NOTE)       Sepsis PCT Algorithm           Lower Respiratory Tract                                      Infection PCT Algorithm    ----------------------------     ----------------------------         PCT < 0.25 ng/mL                PCT < 0.10 ng/mL  Strongly encourage             Strongly discourage   discontinuation of antibiotics    initiation of antibiotics    ----------------------------     -----------------------------       PCT 0.25 - 0.50 ng/mL            PCT 0.10 -  0.25 ng/mL               OR       >80% decrease in PCT            Discourage initiation of                                            antibiotics      Encourage discontinuation           of antibiotics    ----------------------------     -----------------------------         PCT >= 0.50 ng/mL              PCT 0.26 - 0.50 ng/mL               AND       <80% decrease in PCT              Encourage initiation of                                             antibiotics       Encourage continuation           of antibiotics    ----------------------------     -----------------------------        PCT >= 0.50 ng/mL                  PCT > 0.50 ng/mL               AND         increase in PCT                  Strongly encourage                                      initiation of antibiotics    Strongly encourage escalation           of antibiotics                                     -----------------------------                                           PCT <= 0.25 ng/mL                                                 OR                                        >  80% decrease in PCT                                     Discontinue / Do not initiate                                             antibiotics Performed at [facility], [place] [address], [place], [place], [place] [address]   Phosphorus     Status: None   Collection Time: 07/03/18  3:36 AM  Result Value Ref Range   Phosphorus 3.2 2.5 - 4.6 mg/dL    Comment: Performed at [facility] [address] [place], [place] [address]  Glucose, capillary     Status: Abnormal   Collection Time: 07/03/18  3:46 AM  Result Value Ref Range   Glucose-Capillary 125 (H) 70 - 99 mg/dL  Glucose, capillary     Status: Abnormal   Collection Time: 07/03/18  7:54 AM  Result Value Ref Range   Glucose-Capillary 147 (H) 70 - 99 mg/dL   Comment 1 Notify RN    Comment 2 Document in Chart   Culture, respiratory (non-expectorated)     Status: None  (Preliminary result)   Collection Time: 07/03/18  8:36 AM  Result Value Ref Range   Specimen Description TRACHEAL ASPIRATE    Special Requests NONE    Gram Stain      ABUNDANT WBC PRESENT, PREDOMINANTLY PMN MODERATE GRAM NEGATIVE RODS MODERATE GRAM POSITIVE COCCI    Culture      CULTURE REINCUBATED FOR BETTER GROWTH Performed at [facility], [place] [address], [place], [place] [address]    Report Status PENDING   Heparin level (unfractionated)     Status: None   Collection Time: 07/03/18  9:24 AM  Result Value Ref Range   Heparin Unfractionated 0.56 0.30 - 0.70 IU/mL    Comment: (NOTE) If heparin results are below expected values, and patient dosage has  been confirmed, suggest follow up testing of antithrombin III levels. Performed at [facility], [place] [address], [place], [place] [address]   Glucose, capillary     Status: Abnormal   Collection Time: 07/03/18 11:43 AM  Result Value Ref Range   Glucose-Capillary 178 (H) 70 - 99 mg/dL   Comment 1 Notify RN    Comment 2 Document in Chart   Glucose, capillary     Status: Abnormal   Collection Time: 07/03/18  3:32 PM  Result Value Ref Range   Glucose-Capillary 125 (H) 70 - 99 mg/dL   Comment 1 Notify RN    Comment 2 Document in Chart   Heparin level (unfractionated)     Status: None   Collection Time: 07/03/18  3:52 PM  Result Value Ref Range   Heparin Unfractionated 0.44 0.30 - 0.70 IU/mL    Comment: (NOTE) If heparin results are below expected values, and patient dosage has  been confirmed, suggest follow up testing of antithrombin III levels. Performed at [facility], [place] [address], [place], [place] [address]   Glucose, capillary     Status: Abnormal   Collection Time: 07/03/18  8:06 PM  Result Value Ref Range   Glucose-Capillary 171 (H) 70 - 99 mg/dL  Glucose, capillary     Status: Abnormal  Collection Time: 07/03/18 11:32 PM  Result Value Ref Range   Glucose-Capillary 141 (H) 70 - 99 mg/dL   Glucose, capillary     Status: Abnormal   Collection Time: 07/04/18  3:55 AM  Result Value Ref Range   Glucose-Capillary 146 (H) 70 - 99 mg/dL  Heparin level (unfractionated)     Status: Abnormal   Collection Time: 07/04/18  4:01 AM  Result Value Ref Range   Heparin Unfractionated 0.22 (L) 0.30 - 0.70 IU/mL    Comment: (NOTE) If heparin results are below expected values, and patient dosage has  been confirmed, suggest follow up testing of antithrombin III levels. Performed at [facility], [place] [address], [place], [place] [address]   CBC     Status: Abnormal   Collection Time: 07/04/18  4:01 AM  Result Value Ref Range   WBC 12.9 (H) 4.0 - 10.5 K/uL   RBC 4.81 4.22 - 5.81 MIL/uL   Hemoglobin 14.6 13.0 - 17.0 g/dL   HCT 43.6 39.0 - 52.0 %   MCV 90.6 80.0 - 100.0 fL   MCH 30.4 26.0 - 34.0 pg   MCHC 33.5 30.0 - 36.0 g/dL   RDW 13.5 11.5 - 15.5 %   Platelets 154 150 - 400 K/uL   nRBC 0.0 0.0 - 0.2 %    Comment: Performed at [facility], [place] [address], [place], [place] [address]  Basic metabolic panel     Status: Abnormal   Collection Time: 07/04/18  4:01 AM  Result Value Ref Range   Sodium 137 135 - 145 mmol/L   Potassium 4.1 3.5 - 5.1 mmol/L   Chloride 103 98 - 111 mmol/L   CO2 20 (L) 22 - 32 mmol/L   Glucose, Bld 129 (H) 70 - 99 mg/dL   BUN 12 8 - 23 mg/dL   Creatinine, Ser 1.05 0.61 - 1.24 mg/dL   Calcium 8.3 (L) 8.9 - 10.3 mg/dL   GFR calc non Af Amer >60 >60 mL/min   GFR calc Af Amer >60 >60 mL/min   Anion gap 14 5 - 15    Comment: Performed at [facility], [place] [address] [place], [place] [address]  Magnesium     Status: None   Collection Time: 07/04/18  4:01 AM  Result Value Ref Range   Magnesium 2.4 1.7 - 2.4 mg/dL    Comment: Performed at [facility] [address], [place], [place] [address]  Phosphorus     Status: Abnormal   Collection Time: 07/04/18  4:01 AM  Result Value Ref Range   Phosphorus 2.2 (L) 2.5 - 4.6 mg/dL     Comment: Performed at [facility] [address] [place], [place] [address]  Triglycerides     Status: None   Collection Time: 07/04/18  4:01 AM  Result Value Ref Range   Triglycerides 56 <150 mg/dL    Comment: Performed at [facility] [address], [place], [place] [address]  Protime-INR     Status: Abnormal   Collection Time: 07/04/18  4:01 AM  Result Value Ref Range   Prothrombin Time 16.7 (H) 11.4 - 15.2 seconds   INR 1.4 (H) 0.8 - 1.2    Comment: (NOTE) INR goal varies based on device and disease states. Performed at [facility], [place] [address], [place], [place] [address]   APTT     Status: Abnormal   Collection Time: 07/04/18  4:01  AM  Result Value Ref Range   aPTT 84 (H) 24 - 36 seconds    Comment:        IF BASELINE aPTT IS ELEVATED, SUGGEST PATIENT RISK ASSESSMENT BE USED TO DETERMINE APPROPRIATE ANTICOAGULANT THERAPY. Performed at [facility], [place] [address] [place], [place] [address]   Glucose, capillary     Status: Abnormal   Collection Time: 07/04/18  7:40 AM  Result Value Ref Range   Glucose-Capillary 163 (H) 70 - 99 mg/dL   Comment 1 Notify RN    Comment 2 Document in Chart     Recent Results (from the past 240 hour(s))  SARS Coronavirus 2 (CEPHEID- Performed in [facility] lab), Hosp Order     Status: None   Collection Time: 07/14/2018  7:26 PM  Result Value Ref Range Status   SARS Coronavirus 2 NEGATIVE NEGATIVE Final    Comment: (NOTE) If result is NEGATIVE SARS-CoV-2 target nucleic acids are NOT DETECTED. The SARS-CoV-2 RNA is generally detectable in upper and lower  respiratory specimens during the acute phase of infection. The lowest  concentration of SARS-CoV-2 viral copies this assay can detect is 250  copies / mL. A negative result does not preclude SARS-CoV-2 infection  and should not be used as the sole basis for treatment or other  patient management decisions.  A negative result may occur with   improper specimen collection / handling, submission of specimen other  than nasopharyngeal swab, presence of viral mutation(s) within the  areas targeted by this assay, and inadequate number of viral copies  (<250 copies / mL). A negative result must be combined with clinical  observations, patient history, and epidemiological information. If result is POSITIVE SARS-CoV-2 target nucleic acids are DETECTED. The SARS-CoV-2 RNA is generally detectable in upper and lower  respiratory specimens dur ing the acute phase of infection.  Positive  results are indicative of active infection with SARS-CoV-2.  Clinical  correlation with patient history and other diagnostic information is  necessary to determine patient infection status.  Positive results do  not rule out bacterial infection or co-infection with other viruses. If result is PRESUMPTIVE POSTIVE SARS-CoV-2 nucleic acids MAY BE PRESENT.   A presumptive positive result was obtained on the submitted specimen  and confirmed on repeat testing.  While 2019 novel coronavirus  (SARS-CoV-2) nucleic acids may be present in the submitted sample  additional confirmatory testing may be necessary for epidemiological  and / or clinical management purposes  to differentiate between  SARS-CoV-2 and other Sarbecovirus currently known to infect humans.  If clinically indicated additional testing with an alternate test  methodology [phone]) is advised. The SARS-CoV-2 RNA is generally  detectable in upper and lower respiratory sp ecimens during the acute  phase of infection. The expected result is Negative. Fact Sheet for Patients:  StrictlyIdeas.no Fact Sheet for Healthcare Providers: BankingDealers.co.za This test is not yet approved or cleared by the [place] FDA and has been authorized for detection and/or diagnosis of SARS-CoV-2 by FDA under an Emergency Use Authorization (EUA).  This EUA will  remain in effect (meaning this test can be used) for the duration of the COVID-19 declaration under Section 564(b)(1) of the Act, 21 U.S.C. section 360bbb-3(b)(1), unless the authorization is terminated or revoked sooner. Performed at [facility], [address], [place], [place] [address]   MRSA PCR Screening     Status: None   Collection Time: 07/22/2018 11:38 PM  Result Value Ref Range  Status   MRSA by PCR NEGATIVE NEGATIVE Final    Comment:        The GeneXpert MRSA Assay (FDA approved for NASAL specimens only), is one component of a comprehensive MRSA colonization surveillance program. It is not intended to diagnose MRSA infection nor to guide or monitor treatment for MRSA infections. Performed at [facility], [place] [address], [place], [place] [address]   Culture, respiratory (non-expectorated)     Status: None (Preliminary result)   Collection Time: 07/03/18  8:36 AM  Result Value Ref Range Status   Specimen Description TRACHEAL ASPIRATE  Final   Special Requests NONE  Final   Gram Stain   Final    ABUNDANT WBC PRESENT, PREDOMINANTLY PMN MODERATE GRAM NEGATIVE RODS MODERATE GRAM POSITIVE COCCI    Culture   Final    CULTURE REINCUBATED FOR BETTER GROWTH Performed at [facility], [place] [address] [place], [place] [address]    Report Status PENDING  Incomplete    Lipid Panel Recent Labs    07/04/18 0401  TRIG 56    Studies/Results: Ct Head Wo Contrast  Result Date: 07/08/2018 CLINICAL DATA:  Recent seizure activity EXAM: CT HEAD WITHOUT CONTRAST TECHNIQUE: Contiguous axial images were obtained from the base of the skull through the vertex without intravenous contrast. COMPARISON:  11/22/2007 FINDINGS: Brain: Mild atrophic changes are noted. 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  No findings to suggest acute hemorrhage, acute infarction or space-occupying mass lesion are noted. Vascular: No hyperdense vessel or unexpected calcification. Skull: Normal. Negative for fracture or focal lesion. Sinuses/Orbits: No acute finding. Other: None. IMPRESSION: Chronic changes without acute abnormality. Electronically Signed   By: Inez Catalina M.D.   On: 07/18/2018 18:35   Dg Chest Port 1 View  Result Date: 07/04/2018 CLINICAL DATA:  Acute respiratory failure. EXAM: PORTABLE CHEST 1 VIEW COMPARISON:  Chest x-ray dated Jul 02, 2018. FINDINGS: Unchanged endotracheal and enteric tubes. The heart size and mediastinal contours are within normal limits. Normal pulmonary vascularity. Hazy opacity at the right lung base is unchanged. No pleural effusion or pneumothorax. No acute osseous abnormality. IMPRESSION: 1. Stable hazy infiltrate at the right lung base. Electronically Signed   By: Titus Dubin M.D.   On: 07/04/2018 08:03   Dg Chest Port 1 View  Result Date: 07/03/2018 CLINICAL DATA:  63 y/o  M; ET tube placement. EXAM: PORTABLE CHEST 1 VIEW COMPARISON:  07/28/2018 chest radiograph. FINDINGS: ET tube tip projects 5 cm above the carina. Enteric tube tip extends below the field of view into the abdomen. Stable infiltrate at the right lung base. No pleural effusion or pneumothorax. Bones are unremarkable. IMPRESSION: ET tube tip projects 5 cm above the carina. Enteric tube tip extends below the field of view into the abdomen. Stable right basilar infiltrate. Electronically Signed   By: Kristine Garbe M.D.   On: 07/03/2018 00:06   Dg Chest Portable 1 View  Result Date: 07/10/2018 CLINICAL DATA:  Intubation, seizure EXAM: PORTABLE CHEST 1 VIEW COMPARISON:  Portable exam 1910 hours compared to 08/07/2012 FINDINGS: Endotracheal tube present with tip projecting 6.0 cm above carina. Nasogastric tube extends into stomach. Normal heart size, mediastinal contours, and pulmonary vascularity. Mild  airspace infiltrate versus aspiration at RIGHT base. Remaining lungs clear. No pleural effusion or pneumothorax. IMPRESSION: Tube positions as above. Airspace infiltrates  at RIGHT base question infiltrate versus aspiration. Electronically Signed   By: Lavonia Dana M.D.   On: 07/05/2018 19:55    Medications:  Scheduled: . atorvastatin  80 mg Per Tube q1800  . chlorhexidine gluconate (MEDLINE KIT)  15 mL Mouth Rinse BID  . feeding supplement (PRO-STAT SUGAR FREE 64)  30 mL Per Tube Daily  . folic acid  1 mg Intravenous Daily  . insulin aspart  0-9 Units Subcutaneous Q4H  . LORazepam  1 mg Intravenous Q4H  . mouth rinse  15 mL Mouth Rinse 10 times per day  . midazolam  12 mg Intravenous Once  . pantoprazole (PROTONIX) IV  40 mg Intravenous Daily  . potassium & sodium phosphates  2 packet Oral Q6H  . thiamine  100 mg Intravenous Daily  . ticagrelor  90 mg Per Tube BID   Continuous: . sodium chloride    . sodium chloride Stopped (07/04/18 0939)  . ampicillin-sulbactam (UNASYN) IV 3 g (07/04/18 1044)  . feeding supplement (OSMOLITE 1.5 CAL) 50 mL/hr at 07/04/18 0700  . heparin Stopped (07/04/18 8295)  . lacosamide (VIMPAT) IV 90 mL/hr at 07/04/18 1000  . levETIRAcetam Stopped (07/04/18 0555)  . midazolam    . norepinephrine (LEVOPHED) Adult infusion 3 mcg/min (07/04/18 1000)  . propofol (DIPRIVAN) infusion 10 mcg/kg/min (07/04/18 1000)  . valproate sodium Stopped (07/04/18 0705)   LTM EEG report from this AM: This day 2 of continuous EEG monitoring with simultaneous video monitoring recorded multiple electrographic seizures arising from left pericentral parietal cortex as discussed above with subsequent seizures resolution during second half of the recording.  Interictal epileptiform discharges were present involving the left hemisphere particularly left frontocentral central parietal cortex.  These findings suggestive of neuronal dysfunction and cortical irritability across left hemisphere  particular left  Parietal pericentral  cortex.  Continues monitoring is recommended to ensure resolution of seizures.  Clinical correlation is advised.  Discussed with ordering team.  Assessment:63 year old with a history of EtOH abuse and undetectable alcohol level on initial evaluation at [facility] who was brought in from a group home after having seizure activity. Had witnessed tonic-clonic episode in the emergency room at [facility], for which he was given benzodiazepines and loaded with Keppra. He had a total of 3 seizures without return to baseline, consistent with status epilepticus. Mentation did not return to baseline requiring to be intubation for airway protection. He was transferred to [facility] for higher level of care. Overall presentation most consistent with status epilepticus, now resolved, followed by prolonged postictal state.  1. LTM EEG report reviewed. EEG monitoring with simultaneous video monitoring recorded multiple electrographic seizures arising from left pericentral parietal cortex as discussed above with subsequent seizures resolution during second half of the recording.  Interictal epileptiform discharges were present involving the left hemisphere particularly left frontocentral central parietal cortex.   2. Resolution of the electrographic seizures is most likely secondary to propofol initiation.   3. He has a documented history of seizures on his chart but no antiepileptics listed on the chart. He was loaded with Keppra in the outside ER and given benzodiazepines to break the seizure. He is now on Keppra at 1000 mg BID, valproic acid at 5 mg/kg IV TID and Vimpat at 200 mg IV BID.  4. 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 Additional problem list includes leukocytosis and RLL infiltrate thought possibly to be due to aspiration.  6. CT  head revealed chronic changes without acute  abnormality  Recommendations: 1. Obtaining valproic acid level 2. Continue thiamine.  3. Continue LTM EEG.   4. After seizures are stabilized and EEG is discontinued, will need to obtain MRI brain. 5. Unasyn per ICU team for possible infection.  6. Heparin infusion was stopped last night due to bleeding around foley and straight blood from condom cath. He was on this for LV thrombus. Still on Brilinta.  7. Evaluate for stroke given the history of LV thrombus 8. Stopping propofol and starting Versed gtt at 0.2 mg/kg/hr after 0.2 mg/kg bolus. Will monitor LTM EEG for improvement 9. Stopping scheduled Ativan.  45 minutes spent in the neurological evaluation and management of this critically ill patient.     LOS: 2 days   _0  signed: Dr. Kerney Elbe 07/04/2018  11:00 AM

## 2018-07-04 NOTE — Progress Notes (Signed)
LTM maintenance completed; reprepped CZ, C4, and P4. No skin breakdown was seen.

## 2018-07-04 NOTE — Progress Notes (Addendum)
EEG reviewed. Continued left sided irritability with possible intermittent sharps, can not definitively r/o intermittent seizures. Continue the AEDs. Start Propofol at 37mcs/kg/min Stop precedex Pressors to be at bedside in case propofol causes hypotension. Will continue to evaluate  -- Milon Dikes, MD [facility] Pager: [phone] If 7pm to 7am, please call on call as listed on AMION.

## 2018-07-04 NOTE — Progress Notes (Addendum)
eLink Physician-Brief Progress Note Patient Name: Edward Cooper DOB: September 04, 1955 MRN: [id number]   Date of Service  07/04/2018  HPI/Events of Note  Pt on Heparin infusion for presumed LV thrombus. No recent echo in chart documenting the diagnosis. Pt developed hematuria overnight.  eICU Interventions  Discontinue heparin infusion, 2-D echo to evaluate for LV thrombus, PCCM daytime attending to decide if resuming Heparin infusion is indicated. Will check PT-INR, PTT        Tahisha Hakim U Dhyana Bastone 07/04/2018, 6:27 AM

## 2018-07-04 NOTE — Progress Notes (Signed)
NAME:  Edward NashJeffrey D Cooper, MRN:  [id number], DOB:  01/11/1956, LOS: 2 ADMISSION DATE:  07/11/2018, CONSULTATION DATE:  07/23/2018 REFERRING MD:  [facility], CHIEF COMPLAINT:  seizures  Brief History   [age] yoM presenting with witnessed seizures x 3, did not return to baseline and required intubation for airway protection.  Transfer[facility].    History of present illness   HPI obtained from medical chart review as patient is intubated on mechanical ventilation.   63 year old male with history of CAD s/p PCI, LV thrombus on coumadin,  PVD, GERD, HTN, HLD, ICM, seizures, tobacco and ETOH abuse who presented from his boarding house with EMS for reported seizure [facility] ED.  It appears patient is not on any home anticonvulsants.   Reportedly add focal seizure with EMS and noted to be hypertensive 224/124 given ativan. He remained postictal.  Shortly after arrival to ER, he had another seizure, given ativan.  Required NRB to maintain saturations.  CT head was negative for bleed or abnormality.  Loaded with keppra.  Labs noted for INR 1.3, glucose 213, WBC 12, UA neg, UDS positive for THC.  Had ongoing focal seizure activity and unresponsive requiring intubation. CXR with right base infiltrate.  He is to be transferred [facility] with Neurology consulting for continuous EEG, PCCM to admit.   Past Medical History  CAD s/p PCI, LV thrombus on coumadin,  PVD, GERD, HTN, HLD, ICM, seizures, tobacco and ETOH abuse  Significant Hospital Events   5/4 Admit  Consults:  Neurology   Procedures:  5/4 ETT >>  Significant Diagnostic Tests:  5/4 [facility] >> chronic changes, no acute abnormality  5/5 EEG >>abnormal, intermittent left hemispheric sharp waves.   LTM EEG 5/5 >> multiple seizures arising from left paracentral frontocentral and central parietal cortex.  Micro Data:  5/4 MRSA PCR  >>neg  Antimicrobials:  5/5 unasyn >>  Interim history/subjective:   He is critically ill,  intubated on LTM EEG Several events overnight Sedated on propofol which was started after LTM EEG showed seizures. He was also loaded with Vimpat, Keppra and valproate And Ativan standing dose every 4 hours He became hypotensive and was started on Levophed Developed hematuria, Foley blocked with clot and removed and heparin was stopped   Objective   Blood pressure 123/82, pulse 94, temperature (!) 97.2 F (36.2 C), temperature source Axillary, resp. rate 17, height 5\' 11"  (1.803 m), weight 61.6 kg, SpO2 100 %.    Vent Mode: PRVC FiO2 (%):  [40 %] 40 % Set Rate:  [16 bmp] 16 bmp Vt Set:  [600 mL] 600 mL PEEP:  [5 cmH20] 5 cmH20 Plateau Pressure:  [15 cmH20-26 cmH20] 15 cmH20   Intake/Output Summary (Last 24 hours) at 07/04/2018 0853 Last data filed at 07/04/2018 0800 Gross per 24 hour  Intake 1849.37 ml  Output 1225 ml  Net 624.37 ml   Filed Weights   07/03/18 0000 07/03/18 0240 07/04/18 0500  Weight: 62.1 kg 62.1 kg 61.6 kg    Examination: General:  Critically ill thin adult male on MV  HEENT: No pallor, no icterus, no JVD oral ET tube Neuro: Dated on low-dose propofol, does not follow commands, pupils bilateral pinpoint, no meningeal signs CV: SR, no murmur  PULM: even/non-labored, lungs bilaterally clear GI: soft, non-distended, bs active Extremities: warm/dry, no LE edema  Skin: no rashes  Chest x-ray 5/6 personally reviewed which shows stable right lower lobe infiltrate  Resolved Hospital  Problem list    Assessment & Plan:   Acute encephalopathy due to status epilepticus Related to alcohol withdrawal  - THC positive  Hx ETOH abuse P:  Appreciate Neurology assistance Keppra, Vimpat and valproate per neurology Ativan every 4 per neurology Low-dose propofol being titrated based on LTM EEG Seizure precautions Daily thiamine/ folate   Acute respiratory failure RLL infiltrate/ probable aspiration  P:  Spontaneous breathing trials once seizures stopped  VAP  bundle Prn albuterol    Leukocytosis RLL infiltrate / ?aspiration - UA ok P:  Empiric unasyn , await respiratory culture Trend WBC / fever curve   AKI -resolved Hematuria-Foley DC'd 5/5 P:  IV fluids Replace hypophosphatemia Avoid nephrotoxic agents, ensure adequate renal perfusion Follow bladder scan for urinary retention due to hematuria  HTN HLD - neg trop/ normal EKG P:  Hold home coreg/ lisinopril Continue lipitor  CAD s/p PCI, LV thrombus on coumadin,  PVD, ICM (EF 35-40% 2014) - INR 1.3 P:  Continue brilinta Hold coumadin, heparin started but held 5/5 due to hematuria Await echo and if no LV thrombus, may not need anticoagulation at all-we will obtain cardiology input once acute issues resolved  Hyperglycemia P:  SSI sensitive CBG q 4  Depression P:  Holding home zoloft   Best practice:  Diet: NPO Pain/Anxiety/Delirium protocol (if indicated): RA SS goal 0, Celexa if needed VAP protocol (if indicated): yes DVT prophylaxis: SCDs, resume subcu heparin when able GI prophylaxis: PPI Glucose control: SSI sensitive Mobility: BR Code Status: Full  Family Communication: none available Disposition: ICU  The patient is critically ill with multiple organ systems failure and requires high complexity decision making for assessment and support, frequent evaluation and titration of therapies, application of advanced monitoring technologies and extensive interpretation of multiple databases. Critical Care Time devoted to patient care services described in this note independent of APP/resident  time is 35 minutes.    Cyril Mourning MD. Tonny Bollman. [facility] Pager [phone] If no response call [phone]     07/04/2018, 8:53 AM

## 2018-07-04 NOTE — Procedures (Signed)
  Electroencephalogram report- LTM with VIDEO  Ordering Physician : Dr Jodi Mourning  Beginning time:  07/03/18 at 0730 Ending time:  07/04/18 at 0730 CPT/type : 95720  Day of study: day 2    Technical Description: The EEG was performed using standard setting per the guidelines of American Clinical Neurophysiology Society (ACNS).    A minimum of 21 electrodes were placed on scalp according to the International 10-20 or/and 10-10 Systems. Supplemental electrodes were placed as needed. Single EKG electrode was also used to detect cardiac arrhythmia. Patient's behavior was continuously recorded on video simultaneously with EEG. A minimum of 16 channels were used for data display. Each epoch of study was reviewed manually daily and as needed using standard referential and bipolar montages. Computerized quantitative EEG analysis (such as compressed spectral array analysis, trending, automated spike & seizure detection) were used as indicated.    Spike detection: ON  Seizure detection: ON   This  continuous  EEG monitoring with simultaneous video monitoring was performed for this patient with convulsions to rule out clinical and subclinical geographic seizures  medications: As per EMR  Day 1 There was no pushbutton activations events during this recording Background activities were marked by continuous reactive background activities predominantly in the theta range with superimposed faster frequencies.  Stage changes were present.  Superimposed left hemispheric sharp waves and spikes were present maximum negativity in a left frontocentral and central parietal cortex at times with negative field extending to the left frontal polar as well as right posterior paracentral cortex. Occasionally runs of poorly formed left periodic lateralized epileptiform discharges also present  Multiple electrographic seizures were present arising from left paracentral cortex remain confined to the left hemisphere.  No  obvious clinical accompaniment.  Day 2 frequent electrographic seizures present during first half of the recording.  Frequency every 5 minutes gradually improving to the every 3 to 4/h.  Last well-formed electrographic seizure around 5 PM.  Since that time background activities marked by a left hemispheric periodic epileptiform discharges left central parietal paracentral spike and polyspike and wave discharges.   Electrographic seizures marked by buildup of rhythmic synchronized sharp waves in the left central parietal cortex with gradual recruitment of adjacent left central parietal frontocentral and anterior frontal cortex remain confined to the left hemisphere.  Occasionally electrographic seizures were accompanied by right arm jerking and right leg jerking but not always. Left hemispheric periodic epileptiform discharges were present with frequency every 1 to 2 seconds in form of spike and wave and polyspike and wave discharges.  Maximum negativity left central parietal and frontocentral cortex.   Clinical interpretation: This day 2 of continuous EEG monitoring with simultaneous video monitoring recorded multiple electrographic seizures arising from left pericentral parietal cortex as discussed above with subsequent seizures resolution during second half of the recording.  Interictal epileptiform discharges were present involving the left hemisphere particularly left frontocentral central parietal cortex.  These findings suggestive of neuronal dysfunction and cortical irritability across left hemisphere particular left  Parietal pericentral  cortex.  Continues monitoring is recommended to ensure resolution of seizures.  Clinical correlation is advised.  Discussed with ordering team.

## 2018-07-04 NOTE — Progress Notes (Signed)
ANTICOAGULATION CONSULT NOTE - Initial Consult  Pharmacy Consult for heparin Indication: hx LV thrombus  No Known Allergies  Patient Measurements: Height: 5\' 11"  (180.3 cm) Weight: 136 lb 14.5 oz (62.1 kg) IBW/kg (Calculated) : 75.3 Heparin Dosing Weight: TBW  Vital Signs: Temp: 98.8 F (37.1 C) (05/06 0415) Temp Source: Bladder (05/06 0400) BP: 83/65 (05/06 0415) Pulse Rate: 88 (05/06 0415)  Labs: Recent Labs    07/04/2018 1809 07/03/18 0000 07/03/18 0336 07/03/18 0924 07/03/18 1552 07/04/18 0401  HGB 17.3* 16.7 16.1  --   --   --   HCT 58.7* 49.0 48.9  --   --   --   PLT 306  --  168  --   --   --   LABPROT 15.6*  --   --   --   --   --   INR 1.3*  --   --   --   --   --   HEPARINUNFRC  --   --   --  0.56 0.44 0.22*  CREATININE 1.37*  --  1.20  --   --   --   TROPONINI <0.03  --   --   --   --   --     Estimated Creatinine Clearance: 56.8 mL/min (by C-G formula based on SCr of 1.2 mg/dL).   Medical History: Past Medical History:  Diagnosis Date  . Alcoholism (HCC)   . Coronary artery disease    a. 08/07/12: anterior STEMI s/p DES x 2 to ostial LAD, thrombus extraction of mid LAD - c/b R groin hematoma/focal R femoral thrombus. b.  08/15/12: recurrent acute anterior STEMI with occlusion of LAD stent, (following cessation of integrellin prior to scheduled vascular surgery 08/15/12 for right femoral thrombectomy - later deferred), s/p PTCA/thrombectomy of LAD.   Marland Kitchen GERD (gastroesophageal reflux disease)   . Groin hematoma    a. R groin hematoma post-cath 07/2012, conservative management.  Marland Kitchen HTN (hypertension)    a. Soft BP 07/2012 limiting med titration.  . Hyperlipidemia   . Ischemic cardiomyopathy    a. EF 35-40% by echo 07/2012.  . LV (left ventricular) mural thrombus    a. By echo 07/2012 at time of STEMI.  Marland Kitchen NSVT (nonsustained ventricular tachycardia) (HCC)    a. Around time of STEMI 08/07/12.  . Seizures (HCC)   . Thrombus    a. R femoral, incidentally noted on  CT after cath 07/2012 - Focal thrombus within the distal common femoral artery and involving the proximal superficial femoral artery. Initially planned for surgery but this was deferred after 2nd MI from being off integrillin, DAPT. Med rx, OP f/u vascular.  . Tobacco use   . Ventricular fibrillation (HCC)    a. In cath lab at time of STEMI #2 08/15/12.    Assessment: Edward Cooper presenting with seizures requiring intubation, on warfarin PTA for hx of LV thrombus, INR subtherapeutic on admission 1.3 and holding for now.    Heparin level subtherapeutic with AM labs at 0.22, no infusion issues per RN report  Goal of Therapy:  Heparin level 0.3-0.7 units/ml Monitor platelets by anticoagulation protocol: Yes   Plan:  Increase heparin gtt to 1150 units/hr F/u 6 hour heparin level  Daylene Posey, PharmD Clinical Pharmacist Please check AMION for all [facility] Pharmacy numbers 07/04/2018 5:22 AM

## 2018-07-04 NOTE — Progress Notes (Signed)
  Echocardiogram 2D Echocardiogram has been performed.  Edward Cooper 07/04/2018, 9:26 AM

## 2018-07-05 DIAGNOSIS — R569 Unspecified convulsions: Secondary | ICD-10-CM

## 2018-07-05 LAB — CBC
HCT: 40.5 % (ref 39.0–52.0)
Hemoglobin: 13.1 g/dL (ref 13.0–17.0)
MCH: 30 pg (ref 26.0–34.0)
MCHC: 32.3 g/dL (ref 30.0–36.0)
MCV: 92.7 fL (ref 80.0–100.0)
Platelets: 124 10*3/uL — ABNORMAL LOW (ref 150–400)
RBC: 4.37 MIL/uL (ref 4.22–5.81)
RDW: 13.8 % (ref 11.5–15.5)
WBC: 11.3 10*3/uL — ABNORMAL HIGH (ref 4.0–10.5)
nRBC: 0 % (ref 0.0–0.2)

## 2018-07-05 LAB — CULTURE, RESPIRATORY W GRAM STAIN: Culture: NORMAL

## 2018-07-05 LAB — GLUCOSE, CAPILLARY
Glucose-Capillary: 119 mg/dL — ABNORMAL HIGH (ref 70–99)
Glucose-Capillary: 107 mg/dL — ABNORMAL HIGH (ref 70–99)
Glucose-Capillary: 108 mg/dL — ABNORMAL HIGH (ref 70–99)
Glucose-Capillary: 105 mg/dL — ABNORMAL HIGH (ref 70–99)
Glucose-Capillary: 103 mg/dL — ABNORMAL HIGH (ref 70–99)
Glucose-Capillary: 128 mg/dL — ABNORMAL HIGH (ref 70–99)

## 2018-07-05 LAB — HEPARIN LEVEL (UNFRACTIONATED): Heparin Unfractionated: <0.1 IU/mL — ABNORMAL LOW (ref 0.30–0.70)

## 2018-07-05 LAB — BASIC METABOLIC PANEL
Anion gap: 9 (ref 5–15)
BUN: 11 mg/dL (ref 8–23)
CO2: 24 mmol/L (ref 22–32)
Calcium: 8 mg/dL — ABNORMAL LOW (ref 8.9–10.3)
Chloride: 106 mmol/L (ref 98–111)
Creatinine, Ser: 0.94 mg/dL (ref 0.61–1.24)
GFR calc Af Amer: >60 mL/min (ref >60)
GFR calc non Af Amer: >60 mL/min (ref >60)
Glucose, Bld: 129 mg/dL — ABNORMAL HIGH (ref 70–99)
Potassium: 3.7 mmol/L (ref 3.5–5.1)
Sodium: 139 mmol/L (ref 135–145)

## 2018-07-05 LAB — MAGNESIUM: Magnesium: 2.2 mg/dL (ref 1.7–2.4)

## 2018-07-05 LAB — PHOSPHORUS: Phosphorus: 2.4 mg/dL — ABNORMAL LOW (ref 2.5–4.6)

## 2018-07-05 MED ORDER — SODIUM CHLORIDE 0.9 % IV SOLN
50.0000 mg/h | INTRAVENOUS | Status: DC
  Administered 2018-07-05: 50 mg/h via INTRAVENOUS
  Filled 2018-07-05 (×2): qty 20

## 2018-07-05 MED ORDER — TICAGRELOR 90 MG PO TABS
90.0000 mg | ORAL_TABLET | Freq: Two times a day (BID) | ORAL | Status: DC
  Administered 2018-07-05 – 2018-07-07 (×4): 90 mg via ORAL
  Filled 2018-07-05 (×4): qty 1

## 2018-07-05 NOTE — Progress Notes (Signed)
LTM maintenance completed, reprepped PZ, CZ, P4, Fp1, and F4. No skin breakdown was seen.

## 2018-07-05 NOTE — Progress Notes (Signed)
Still not in burst suppression pattern.   Bolusing with 10 mg Versed and increasing gtt to 50 mg/hr.   Electronically signed: Dr. Caryl Pina

## 2018-07-05 NOTE — Progress Notes (Signed)
Subjective: Intubated and comatose.   Objective: Current vital signs: BP 112/75   Pulse 95   Temp 98.7 F (37.1 C) (Axillary)   Resp 16   Ht 5' 11"  (1.803 m)   Wt 61.9 kg   SpO2 100%   BMI 19.03 kg/m  Vital signs in last 24 hours: Temp:  [98.1 F (36.7 C)-101.2 F (38.4 C)] 98.7 F (37.1 C) (05/07 1157) Pulse Rate:  [90-110] 95 (05/07 1542) Resp:  [16-21] 16 (05/07 1542) BP: (86-127)/(58-83) 112/75 (05/07 1542) SpO2:  [100 %] 100 % (05/07 1542) FiO2 (%):  [30 %] 30 % (05/07 1542) Weight:  [61.9 kg] 61.9 kg (05/07 0500)  Intake/Output from previous day: 05/06 0701 - 05/07 0700 In: 2419.4 [I.V.:401.3; NG/GT:1370; IV Piggyback:648] Out: 1125 [Urine:950; Emesis/NG output:175] Intake/Output this shift: Total I/O In: 796.9 [I.V.:95.9; NG/GT:500; IV Piggyback:201] Out: 600 [Urine:600] Nutritional status:  Diet Order            Diet NPO time specified  Diet effective now             HEENT: [place]/AT Lungs: Intubated Ext: Cachectic with decreased muscle bulk x 4. No edema.   Neurologic Exam: Ment: Eyes closed with no eye opening to any stimuli. No movement to any stimuli. Does not follow commands or attempt to communicate. Comatose.  CN: Pupils unreactive at 2 mm. No blink to threat. No doll's eye reflex. No grimace to noxious.  Motor/Sensory: Increased extensor tone all 4 extremities, worse on the right.  Moves RUE to pinch 2/5. Trace movement of LUE to pinch after a significant delay.  Weakly withdraws BLE to plantar stimulation, more briskly on the left  Reflexes: Pathologically brisk reflexes throughout, brisker on right than left.  Right toe upgoing, left toe upgoing.    Lab Results: Results for orders placed or performed during the hospital encounter of 07/11/2018 (from the past 48 hour(s))  Glucose, capillary     Status: Abnormal   Collection Time: 07/03/18  8:06 PM  Result Value Ref Range   Glucose-Capillary 171 (H) 70 - 99 mg/dL  Glucose, capillary     Status:  Abnormal   Collection Time: 07/03/18 11:32 PM  Result Value Ref Range   Glucose-Capillary 141 (H) 70 - 99 mg/dL  Glucose, capillary     Status: Abnormal   Collection Time: 07/04/18  3:55 AM  Result Value Ref Range   Glucose-Capillary 146 (H) 70 - 99 mg/dL  Heparin level (unfractionated)     Status: Abnormal   Collection Time: 07/04/18  4:01 AM  Result Value Ref Range   Heparin Unfractionated 0.22 (L) 0.30 - 0.70 IU/mL    Comment: (NOTE) If heparin results are below expected values, and patient dosage has  been confirmed, suggest follow up testing of antithrombin III levels. Performed at [facility], [place] [address], [place], [place] [address]   CBC     Status: Abnormal   Collection Time: 07/04/18  4:01 AM  Result Value Ref Range   WBC 12.9 (H) 4.0 - 10.5 K/uL   RBC 4.81 4.22 - 5.81 MIL/uL   Hemoglobin 14.6 13.0 - 17.0 g/dL   HCT 43.6 39.0 - 52.0 %   MCV 90.6 80.0 - 100.0 fL   MCH 30.4 26.0 - 34.0 pg   MCHC 33.5 30.0 - 36.0 g/dL   RDW 13.5 11.5 - 15.5 %   Platelets 154 150 - 400 K/uL   nRBC 0.0 0.0 - 0.2 %    Comment: Performed  at [facility], [place] [address], [place], [place] [address]  Basic metabolic panel     Status: Abnormal   Collection Time: 07/04/18  4:01 AM  Result Value Ref Range   Sodium 137 135 - 145 mmol/L   Potassium 4.1 3.5 - 5.1 mmol/L   Chloride 103 98 - 111 mmol/L   CO2 20 (L) 22 - 32 mmol/L   Glucose, Bld 129 (H) 70 - 99 mg/dL   BUN 12 8 - 23 mg/dL   Creatinine, Ser 1.05 0.61 - 1.24 mg/dL   Calcium 8.3 (L) 8.9 - 10.3 mg/dL   GFR calc non Af Amer >60 >60 mL/min   GFR calc Af Amer >60 >60 mL/min   Anion gap 14 5 - 15    Comment: Performed at [facility], [place] [address], [place], [place] [address]  Magnesium     Status: None   Collection Time: 07/04/18  4:01 AM  Result Value Ref Range   Magnesium 2.4 1.7 - 2.4 mg/dL    Comment: Performed at [place] [address], [place], [place] [address]  Phosphorus     Status:  Abnormal   Collection Time: 07/04/18  4:01 AM  Result Value Ref Range   Phosphorus 2.2 (L) 2.5 - 4.6 mg/dL    Comment: Performed at [facility] [address] [place], [place] [address]  Triglycerides     Status: None   Collection Time: 07/04/18  4:01 AM  Result Value Ref Range   Triglycerides 56 <150 mg/dL    Comment: Performed at [facility] [address], [place], [place] [address]  Protime-INR     Status: Abnormal   Collection Time: 07/04/18  4:01 AM  Result Value Ref Range   Prothrombin Time 16.7 (H) 11.4 - 15.2 seconds   INR 1.4 (H) 0.8 - 1.2    Comment: (NOTE) INR goal varies based on device and disease states. Performed at [facility], [place] [address] [place], [place] [address]   APTT     Status: Abnormal   Collection Time: 07/04/18  4:01 AM  Result Value Ref Range   aPTT 84 (H) 24 - 36 seconds    Comment:        IF BASELINE aPTT IS ELEVATED, SUGGEST PATIENT RISK ASSESSMENT BE USED TO DETERMINE APPROPRIATE ANTICOAGULANT THERAPY. Performed at [facility], [place] [address], [place], [place] [address]   Glucose, capillary     Status: Abnormal   Collection Time: 07/04/18  7:40 AM  Result Value Ref Range   Glucose-Capillary 163 (H) 70 - 99 mg/dL   Comment 1 Notify RN    Comment 2 Document in Chart   Valproic acid level     Status: None   Collection Time: 07/04/18 11:47 AM  Result Value Ref Range   Valproic Acid Lvl 64 50.0 - 100.0 ug/mL    Comment: Performed at [place] [address] [place], [place] [address]  Glucose, capillary     Status: None   Collection Time: 07/04/18 12:04 PM  Result Value Ref Range   Glucose-Capillary 92 70 - 99 mg/dL   Comment 1 Notify RN    Comment 2 Document in Chart   Glucose, capillary     Status: Abnormal   Collection Time: 07/04/18  3:21 PM  Result Value Ref Range   Glucose-Capillary 104 (H) 70 - 99 mg/dL   Comment 1 Notify RN  Comment 2 Document in Chart   Glucose, capillary      Status: Abnormal   Collection Time: 07/04/18  7:45 PM  Result Value Ref Range   Glucose-Capillary 121 (H) 70 - 99 mg/dL  Glucose, capillary     Status: Abnormal   Collection Time: 07/04/18 11:49 PM  Result Value Ref Range   Glucose-Capillary 149 (H) 70 - 99 mg/dL  Glucose, capillary     Status: Abnormal   Collection Time: 07/05/18  3:16 AM  Result Value Ref Range   Glucose-Capillary 119 (H) 70 - 99 mg/dL  Heparin level (unfractionated)     Status: Abnormal   Collection Time: 07/05/18  4:04 AM  Result Value Ref Range   Heparin Unfractionated <0.10 (L) 0.30 - 0.70 IU/mL    Comment: (NOTE) If heparin results are below expected values, and patient dosage has  been confirmed, suggest follow up testing of antithrombin III levels. Performed at [facility], [place] [address], [place], [place] [address]   CBC     Status: Abnormal   Collection Time: 07/05/18  4:04 AM  Result Value Ref Range   WBC 11.3 (H) 4.0 - 10.5 K/uL   RBC 4.37 4.22 - 5.81 MIL/uL   Hemoglobin 13.1 13.0 - 17.0 g/dL   HCT 40.5 39.0 - 52.0 %   MCV 92.7 80.0 - 100.0 fL   MCH 30.0 26.0 - 34.0 pg   MCHC 32.3 30.0 - 36.0 g/dL   RDW 13.8 11.5 - 15.5 %   Platelets 124 (L) 150 - 400 K/uL   nRBC 0.0 0.0 - 0.2 %    Comment: Performed at [facility], [place] [address], [place], [place] [address]  Basic metabolic panel     Status: Abnormal   Collection Time: 07/05/18  4:04 AM  Result Value Ref Range   Sodium 139 135 - 145 mmol/L   Potassium 3.7 3.5 - 5.1 mmol/L   Chloride 106 98 - 111 mmol/L   CO2 24 22 - 32 mmol/L   Glucose, Bld 129 (H) 70 - 99 mg/dL   BUN 11 8 - 23 mg/dL   Creatinine, Ser 0.94 0.61 - 1.24 mg/dL   Calcium 8.0 (L) 8.9 - 10.3 mg/dL   GFR calc non Af Amer >60 >60 mL/min   GFR calc Af Amer >60 >60 mL/min   Anion gap 9 5 - 15    Comment: Performed at [facility], [place] [address] [place], [place] [address]  Magnesium     Status: None   Collection Time: 07/05/18  4:04 AM  Result Value Ref  Range   Magnesium 2.2 1.7 - 2.4 mg/dL    Comment: Performed at [facility] [address], [place], [place] [address]  Phosphorus     Status: Abnormal   Collection Time: 07/05/18  4:04 AM  Result Value Ref Range   Phosphorus 2.4 (L) 2.5 - 4.6 mg/dL    Comment: Performed at [facility] [address], [place], [place] [address]  Glucose, capillary     Status: Abnormal   Collection Time: 07/05/18  7:47 AM  Result Value Ref Range   Glucose-Capillary 107 (H) 70 - 99 mg/dL  Glucose, capillary     Status: Abnormal   Collection Time: 07/05/18 11:12 AM  Result Value Ref Range   Glucose-Capillary 108 (H) 70 - 99 mg/dL  Glucose, capillary     Status: Abnormal   Collection Time: 07/05/18  3:13 PM  Result Value Ref Range   Glucose-Capillary 105 (H) 70 - 99 mg/dL    Recent Results (from the past 240 hour(s))  SARS Coronavirus 2 (CEPHEID- Performed in [facility] lab), Hosp Order     Status: None   Collection Time: 06/30/2018  7:26 PM  Result Value Ref Range Status   SARS Coronavirus 2 NEGATIVE NEGATIVE Final    Comment: (NOTE) If result is NEGATIVE SARS-CoV-2 target nucleic acids are NOT DETECTED. The SARS-CoV-2 RNA is generally detectable in upper and lower  respiratory specimens during the acute phase of infection. The lowest  concentration of SARS-CoV-2 viral copies this assay can detect is 250  copies / mL. A negative result does not preclude SARS-CoV-2 infection  and should not be used as the sole basis for treatment or other  patient management decisions.  A negative result may occur with  improper specimen collection / handling, submission of specimen other  than nasopharyngeal swab, presence of viral mutation(s) within the  areas targeted by this assay, and inadequate number of viral copies  (<250 copies / mL). A negative result must be combined with clinical  observations, patient history, and epidemiological information. If result is POSITIVE SARS-CoV-2  target nucleic acids are DETECTED. The SARS-CoV-2 RNA is generally detectable in upper and lower  respiratory specimens dur ing the acute phase of infection.  Positive  results are indicative of active infection with SARS-CoV-2.  Clinical  correlation with patient history and other diagnostic information is  necessary to determine patient infection status.  Positive results do  not rule out bacterial infection or co-infection with other viruses. If result is PRESUMPTIVE POSTIVE SARS-CoV-2 nucleic acids MAY BE PRESENT.   A presumptive positive result was obtained on the submitted specimen  and confirmed on repeat testing.  While 2019 novel coronavirus  (SARS-CoV-2) nucleic acids may be present in the submitted sample  additional confirmatory testing may be necessary for epidemiological  and / or clinical management purposes  to differentiate between  SARS-CoV-2 and other Sarbecovirus currently known to infect humans.  If clinically indicated additional testing with an alternate test  methodology [phone]) is advised. The SARS-CoV-2 RNA is generally  detectable in upper and lower respiratory sp ecimens during the acute  phase of infection. The expected result is Negative. Fact Sheet for Patients:  StrictlyIdeas.no Fact Sheet for Healthcare Providers: BankingDealers.co.za This test is not yet approved or cleared by the [place] FDA and has been authorized for detection and/or diagnosis of SARS-CoV-2 by FDA under an Emergency Use Authorization (EUA).  This EUA will remain in effect (meaning this test can be used) for the duration of the COVID-19 declaration under Section 564(b)(1) of the Act, 21 U.S.C. section 360bbb-3(b)(1), unless the authorization is terminated or revoked sooner. Performed at [facility], [place] [place], [place] [address]   MRSA PCR Screening     Status: None   Collection Time: 07/27/2018 11:38 PM   Result Value Ref Range Status   MRSA by PCR NEGATIVE NEGATIVE Final    Comment:        The GeneXpert MRSA Assay (FDA approved for NASAL specimens only), is one component of a comprehensive MRSA colonization surveillance program. It is not intended to diagnose MRSA infection nor to guide or monitor treatment for MRSA infections. Performed at [facility], [place] [address], [place], [place] [address]   Culture, respiratory (non-expectorated)     Status: None   Collection Time: 07/03/18  8:36 AM  Result Value Ref Range Status   Specimen Description TRACHEAL ASPIRATE  Final   Special Requests NONE  Final   Gram Stain   Final    ABUNDANT WBC PRESENT, PREDOMINANTLY PMN MODERATE GRAM NEGATIVE RODS MODERATE GRAM POSITIVE COCCI    Culture   Final    FEW Consistent with normal respiratory flora. Performed at [facility], [place] [address] [place],  [address]    Report Status 07/05/2018 FINAL  Final    Lipid Panel Recent Labs    07/04/18 0401  TRIG 56    Studies/Results: Dg Chest Port 1 View  Result Date: 07/04/2018 CLINICAL DATA:  Acute respiratory failure. EXAM: PORTABLE CHEST 1 VIEW COMPARISON:  Chest x-ray dated Jul 02, 2018. FINDINGS: Unchanged endotracheal and enteric tubes. The heart size and mediastinal contours are within normal limits. Normal pulmonary vascularity. Hazy opacity at the right lung base is unchanged. No pleural effusion or pneumothorax. No acute osseous abnormality. IMPRESSION: 1. Stable hazy infiltrate at the right lung base. Electronically Signed   By: Titus Dubin M.D.   On: 07/04/2018 08:03    Medications:  Scheduled: . atorvastatin  80 mg Per Tube q1800  . chlorhexidine gluconate (MEDLINE KIT)  15 mL Mouth Rinse BID  . feeding supplement (PRO-STAT SUGAR FREE 64)  30 mL Per Tube Daily  . folic acid  1 mg Intravenous Daily  . insulin aspart  0-9 Units Subcutaneous Q4H  . LORazepam  1 mg Intravenous Q4H  . mouth rinse  15 mL Mouth  Rinse 10 times per day  . pantoprazole (PROTONIX) IV  40 mg Intravenous Daily  . thiamine  100 mg Intravenous Daily  . ticagrelor  90 mg Oral BID   Continuous: . sodium chloride    . sodium chloride Stopped (07/04/18 1044)  . ampicillin-sulbactam (UNASYN) IV 3 g (07/05/18 1620)  . feeding supplement (OSMOLITE 1.5 CAL) 1,000 mL (07/04/18 2200)  . lacosamide (VIMPAT) IV Stopped (07/05/18 1112)  . levETIRAcetam Stopped (07/05/18 0548)  . midazolam 12 mg/hr (07/05/18 1400)  . norepinephrine (LEVOPHED) Adult infusion 2 mcg/min (07/04/18 1400)  . valproate sodium 53 mL/hr at 07/05/18 1400    LTM EEG report, 5/7: This day 3 of continuous EEG monitoring with simultaneous video monitoring did not demonstrate any clinical subclinical seizures.  Left hemispheric periodic lateralized epileptiform discharges were present with maximum negativity in the left posterior quadrant suggestive of cortical irritability in that region.    Assessment:63 year old with a history of EtOH abuse and undetectable alcohol level on initial evaluation at [facility] who was brought in from a group home after having seizure activity.Had witnessed tonic-clonic episode in the emergency room at [facility], for which he was given benzodiazepines and loaded with Keppra.He had a total of 3 seizures without return to baseline, consistent with status epilepticus.Mentation did not returnto baseline requiring to be intubationfor airway protection. [facility]. Overall presentation most consistent with status epilepticus, now resolved, followed by prolonged postictal state. 1.LTM EEG report reviewed. This day 3 of continuous EEG monitoring with simultaneous video monitoring did not demonstrate any clinical subclinical seizures. Left hemispheric periodic lateralized epileptiform discharges were present with maximum negativity in the left posterior quadrant suggestive of  cortical irritability in that region.  2. Resolution of the electrographic seizures continues while on midazolam. Will increase midazolam towards goal of burst suppression and eliminating PLEDs in left hemisphere, which have a high potential of evolving to seizures.  After 24-48 hours of burst suppression will taper off Versed while on LTM EEG.   3.He has a documented history of seizures on his chart but no antiepileptics listed as an outpatient in [place].He was loaded with Keppra in the outside ER and given benzodiazepines to break the seizure. He is now on Keppra at1000 mg BID, valproic acid at5 mg/kgIV TID and Vimpat at 200 mg IV BID. Valproic acid level from yesterday was therapeutic at 64. 4. Other considerations for his presentation include stroke as he has a history of LV thrombus and was on Coumadin as a listed home medication. 5. Additional problem list includes leukocytosis and RLL infiltrate thought possibly to be due to aspiration. 6. CT head revealed chronic changes without acute abnormality  Recommendations: 1. Continuing LTM EEG 2. Continue thiamine.  3. Increase Versed from 12 mg/hr to 30 mg/hr after a bolus of 10 mg x 1 now. 4. After seizures are stabilized and EEG is discontinued, will need to obtain MRI brain. 5. Unasyn per ICU team for possible infection. 6. Heparin infusion was stopped due to bleeding around foley and straight blood from condom cath. He was on this for LV thrombus.Still on Brilinta.  7. Evaluate for stroke with MRI given the history of LV thrombus.     LOS: 3 days   @Electronically  signed: Dr. Kerney Elbe 07/05/2018  4:57 PM

## 2018-07-05 NOTE — Procedures (Signed)
  Electroencephalogram report- LTM with VIDEO  Ordering Physician : Dr Jodi Mourning  Beginning time:  07/04/18 at 0730 Ending time:  07/05/18 at 0730 CPT/type : 95720  Day of study: day 3   Technical Description: The EEG was performed using standard setting per the guidelines of American Clinical Neurophysiology Society (ACNS).    A minimum of 21 electrodes were placed on scalp according to the International 10-20 or/and 10-10 Systems. Supplemental electrodes were placed as needed. Single EKG electrode was also used to detect cardiac arrhythmia. Patient's behavior was continuously recorded on video simultaneously with EEG. A minimum of 16 channels were used for data display. Each epoch of study was reviewed manually daily and as needed using standard referential and bipolar montages. Computerized quantitative EEG analysis (such as compressed spectral array analysis, trending, automated spike & seizure detection) were used as indicated.    Spike detection: ON  Seizure detection: ON   This  continuous  EEG monitoring with simultaneous video monitoring was performed for this patient with convulsions to rule out clinical and subclinical geographic seizures  medications: As per EMR  Day 1 There was no pushbutton activations events during this recording Background activities were marked by continuous reactive background activities predominantly in the theta range with superimposed faster frequencies.  Stage changes were present.  Superimposed left hemispheric sharp waves and spikes were present maximum negativity in a left frontocentral and central parietal cortex at times with negative field extending to the left frontal polar as well as right posterior paracentral cortex. Occasionally runs of poorly formed left periodic lateralized epileptiform discharges also present  Multiple electrographic seizures were present arising from left paracentral cortex remain confined to the left hemisphere.  No obvious  clinical accompaniment.  Day 2 frequent electrographic seizures present during first half of the recording.  Frequency every 5 minutes gradually improving to the every 3 to 4/h.  Last well-formed electrographic seizure around 5 PM.  Since that time background activities marked by a left hemispheric periodic epileptiform discharges left central parietal paracentral spike and polyspike and wave discharges.   Electrographic seizures marked by buildup of rhythmic synchronized sharp waves in the left central parietal cortex with gradual recruitment of adjacent left central parietal frontocentral and anterior frontal cortex remain confined to the left hemisphere.  Occasionally electrographic seizures were accompanied by right arm jerking and right leg jerking but not always. Left hemispheric periodic epileptiform discharges were present with frequency every 1 to 2 seconds in form of spike and wave and polyspike and wave discharges.  Maximum negativity left central parietal and frontocentral cortex.  Day 3: No clinical or subclinical seizures present throughout the recording.  Persistent left hemispheric periodic lateralized epileptiform discharges present with maximum negativity in the left posterior quadrant.  Clinical interpretation: This day 3 of continuous EEG monitoring with simultaneous video monitoring did not demonstrate any clinical subclinical seizures.  Left hemispheric periodic lateralized epileptiform discharges were present with maximum negativity in the left posterior quadrant suggestive of cortical irritability in that region.  If concern for seizures persist continuous monitoring is recommended to ensure resolution of seizures.  Clinical correlation is advised.

## 2018-07-05 NOTE — Progress Notes (Signed)
NAME:  Amado NashJeffrey D Colton, MRN:  [id number], DOB:  [birth year], LOS: 3 ADMISSION DATE:  07/06/2018, CONSULTATION DATE:  07/12/2018 REFERRING MD:  [facility], CHIEF COMPLAINT:  seizures  Brief History   [age] yoM presenting with witnessed seizures x 3, did not return to baseline and required intubation for airway protection.  [facility] for [facility].    History of present illness   HPI obtained from medical chart review as patient is intubated on mechanical ventilation.   63 year old male with history of CAD s/p PCI, LV thrombus on coumadin,  PVD, GERD, HTN, HLD, ICM, seizures, tobacco and ETOH abuse who presented from his boarding house with EMS for reported seizure to [facility] ED.  It appears patient is not on any home anticonvulsants.   Reportedly add focal seizure with EMS and noted to be hypertensive 224/124 given ativan. He remained postictal.  Shortly after arrival to ER, he had another seizure, given ativan.  Required NRB to maintain saturations.  CT head was negative for bleed or abnormality.  Loaded with keppra.  Labs noted for INR 1.3, glucose 213, WBC 12, UA neg, UDS positive for THC.  Had ongoing focal seizure activity and unresponsive requiring intubation. CXR with right base infiltrate.  He is to be [facility] with Neurology consulting for continuous EEG, PCCM to admit.   Past Medical History  CAD s/p PCI, LV thrombus on coumadin,  PVD, GERD, HTN, HLD, ICM, seizures, tobacco and ETOH abuse  Significant Hospital Events   5/4 Admit  Consults:  Neurology   Procedures:  5/4 ETT >>  Significant Diagnostic Tests:  5/4 [facility] >> chronic changes, no acute abnormality  5/5 EEG >>abnormal, intermittent left hemispheric sharp waves.   LTM EEG 5/5 >> multiple seizures arising from left paracentral frontocentral and central parietal cortex. 07/04/2018 2D echo with LV ejection fraction of 60% and no LV thrombus Micro Data:  5/4 MRSA PCR  >>neg  Antimicrobials:  5/5  unasyn >>  Interim history/subjective:  Transition off propofol to Versed drip per neurology Leaving him off heparin as 2D echo reveals no LV thrombus therefore no indication for systemic anticoagulation Continue Brilinta No weaning at this time due to heavy sedation   Objective   Blood pressure 113/67, pulse 94, temperature 98.1 F (36.7 C), temperature source Axillary, resp. rate 16, height 5\' 11"  (1.803 m), weight 61.9 kg, SpO2 100 %.    Vent Mode: PRVC FiO2 (%):  [30 %-40 %] 30 % Set Rate:  [16 bmp] 16 bmp Vt Set:  [600 mL] 600 mL PEEP:  [5 cmH20] 5 cmH20 Plateau Pressure:  [14 cmH20-16 cmH20] 16 cmH20   Intake/Output Summary (Last 24 hours) at 07/05/2018 0849 Last data filed at 07/05/2018 0600 Gross per 24 hour  Intake 2336.78 ml  Output 1125 ml  Net 1211.78 ml   Filed Weights   07/03/18 0240 07/04/18 0500 07/05/18 0500  Weight: 62.1 kg 61.6 kg 61.9 kg    Examination: General: Frail cachectic male in no acute distress HEENT: Tracheal tube and gastric tube in place Neuro: Sedated with Versed does not follow commands.  Does withdraw to noxious stimuli CV: s1s2 rrr, no m/r/g PULM: even/non-labored, lungs bilaterally coarse rhonchi bilaterally NW:GNFAGI:soft, non-tender, bsx4 active  Extremities: warm/dry, negative edema  Skin: no rashes or lesions   07/05/2018 no chest x-ray  Resolved Hospital Problem list    Assessment & Plan:   Acute encephalopathy due to status epilepticus Related to alcohol withdrawal  -  THC positive  Hx ETOH abuse P:  Patient neurology's input Seizure medication per neurology Now on Versed drip per neurology Propofol has been discontinued Precautions Monitor for EtOH withdrawal Continue thiamine and folic acid   Acute respiratory failure RLL infiltrate/ probable aspiration  P:  Spontaneous breathing trials once seizures have stopped and sedation is been decreased Vap bundle  Leukocytosis RLL infiltrate / ?aspiration - UA ok P:   Continue empirical Unasyn Trend WBC monitor culture data  AKI -resolved Hematuria-Foley DC'd 5/5 Lab Results  Component Value Date   CREATININE 0.94 07/05/2018   CREATININE 1.05 07/04/2018   CREATININE 1.20 07/03/2018    P:  Continue IV fluids Continue replete electrolytes With nephrotoxins Serial bladder scans for urinary retention due to hematuria status post Foley removal  HTN HLD Negative troponin with normal EKG   P:  Hold home Coreg and lisinopril , Lipitor  CAD s/p PCI, LV thrombus on coumadin,  PVD, ICM (EF 35-40% 2014) - INR 1.3 P:  Continue Brilinta Note he was on Coumadin for history of left LV thrombus, 2D echo on 07/04/2018 did not reveal thrombus therefore no need for systemic anticoagulation with heparin and/or Coumadin. Therefore we will hold heparin May see cardiology consult in the near future  Hyperglycemia CBG (last 3)  Recent Labs    07/04/18 2349 07/05/18 0316 07/05/18 0747  GLUCAP 149* 119* 107*    P:  Sliding scale insulin protocol Currently on tube feeding  Depression P:  Holding home Zoloft  Best practice:  Diet: NPO/tube feeding at goal Pain/Anxiety/Delirium protocol (if indicated): RA SS goal 0, Celexa if needed VAP protocol (if indicated): yes DVT prophylaxis: SCDs, resume subcu heparin when able GI prophylaxis: PPI Glucose control: SSI sensitive Mobility: BR Code Status: Full  Family Communication: 07/05/2018 no family updated available Disposition: ICU  App CCT 30 min   Brett Canales Deepika Decatur ACNP [facility] PCCM Pager [phone] till 1 pm If no answer page [phone] 07/05/2018, 8:49 AM

## 2018-07-06 ENCOUNTER — Inpatient Hospital Stay (HOSPITAL_COMMUNITY): Payer: Medicare Other

## 2018-07-06 LAB — CBC WITH DIFFERENTIAL/PLATELET
Abs Immature Granulocytes: 0.04 10*3/uL (ref 0.00–0.07)
Basophils Absolute: 0 10*3/uL (ref 0.0–0.1)
Basophils Relative: 0 %
Eosinophils Absolute: 0.1 10*3/uL (ref 0.0–0.5)
Eosinophils Relative: 1 %
HCT: 41.4 % (ref 39.0–52.0)
Hemoglobin: 13.4 g/dL (ref 13.0–17.0)
Immature Granulocytes: 0 %
Lymphocytes Relative: 8 %
Lymphs Abs: 0.7 10*3/uL (ref 0.7–4.0)
MCH: 30 pg (ref 26.0–34.0)
MCHC: 32.4 g/dL (ref 30.0–36.0)
MCV: 92.8 fL (ref 80.0–100.0)
Monocytes Absolute: 1.2 10*3/uL — ABNORMAL HIGH (ref 0.1–1.0)
Monocytes Relative: 13 %
Neutro Abs: 6.9 10*3/uL (ref 1.7–7.7)
Neutrophils Relative %: 78 %
Platelets: 112 10*3/uL — ABNORMAL LOW (ref 150–400)
RBC: 4.46 MIL/uL (ref 4.22–5.81)
RDW: 13.9 % (ref 11.5–15.5)
WBC: 8.9 10*3/uL (ref 4.0–10.5)
nRBC: 0 % (ref 0.0–0.2)

## 2018-07-06 LAB — GLUCOSE, CAPILLARY
Glucose-Capillary: 101 mg/dL — ABNORMAL HIGH (ref 70–99)
Glucose-Capillary: 119 mg/dL — ABNORMAL HIGH (ref 70–99)
Glucose-Capillary: 86 mg/dL (ref 70–99)
Glucose-Capillary: 90 mg/dL (ref 70–99)
Glucose-Capillary: 113 mg/dL — ABNORMAL HIGH (ref 70–99)
Glucose-Capillary: 112 mg/dL — ABNORMAL HIGH (ref 70–99)

## 2018-07-06 LAB — BASIC METABOLIC PANEL
Anion gap: 8 (ref 5–15)
BUN: 9 mg/dL (ref 8–23)
CO2: 22 mmol/L (ref 22–32)
Calcium: 8.3 mg/dL — ABNORMAL LOW (ref 8.9–10.3)
Chloride: 109 mmol/L (ref 98–111)
Creatinine, Ser: 0.83 mg/dL (ref 0.61–1.24)
GFR calc Af Amer: >60 mL/min (ref >60)
GFR calc non Af Amer: >60 mL/min (ref >60)
Glucose, Bld: 107 mg/dL — ABNORMAL HIGH (ref 70–99)
Potassium: 3.8 mmol/L (ref 3.5–5.1)
Sodium: 139 mmol/L (ref 135–145)

## 2018-07-06 LAB — MAGNESIUM: Magnesium: 2 mg/dL (ref 1.7–2.4)

## 2018-07-06 LAB — PHOSPHORUS: Phosphorus: 2.8 mg/dL (ref 2.5–4.6)

## 2018-07-06 MED ORDER — SODIUM CHLORIDE 0.9 % IV SOLN: 200.0000 mg | Freq: Once | INTRAVENOUS | Status: AC

## 2018-07-06 MED ORDER — PANTOPRAZOLE SODIUM 40 MG PO PACK
40.0000 mg | PACK | Freq: Every day | ORAL | Status: DC
  Administered 2018-07-06 – 2018-07-24 (×19): 40 mg
  Filled 2018-07-06 (×16): qty 20

## 2018-07-06 MED ORDER — SODIUM CHLORIDE 0.9 % IV SOLN
30.0000 mg/h | INTRAVENOUS | Status: DC
  Administered 2018-07-06 (×2): 70 mg/h via INTRAVENOUS
  Administered 2018-07-06: 50 mg/h via INTRAVENOUS
  Administered 2018-07-06: 90 mg/h via INTRAVENOUS
  Administered 2018-07-06: 50 mg/h via INTRAVENOUS
  Administered 2018-07-07: 17:00:00 110 mg/h via INTRAVENOUS
  Administered 2018-07-07: 90 mg/h via INTRAVENOUS
  Administered 2018-07-07: 110 mg/h via INTRAVENOUS
  Administered 2018-07-07: 06:00:00 90 mg/h via INTRAVENOUS
  Administered 2018-07-07 – 2018-07-08 (×6): 130 mg/h via INTRAVENOUS
  Administered 2018-07-09 (×2): 75 mg/h via INTRAVENOUS
  Administered 2018-07-09: 130 mg/h via INTRAVENOUS
  Administered 2018-07-09: 100 mg/h via INTRAVENOUS
  Administered 2018-07-09: 130 mg/h via INTRAVENOUS
  Administered 2018-07-10: 07:00:00 75 mg/h via INTRAVENOUS
  Filled 2018-07-06 (×4): qty 100
  Filled 2018-07-06: qty 50
  Filled 2018-07-06 (×3): qty 100
  Filled 2018-07-06 (×2): qty 50
  Filled 2018-07-06 (×4): qty 100
  Filled 2018-07-06 (×2): qty 50
  Filled 2018-07-06 (×4): qty 100
  Filled 2018-07-06 (×2): qty 50
  Filled 2018-07-06: qty 100

## 2018-07-06 MED ORDER — MIDAZOLAM BOLUS VIA INFUSION
10.0000 mg | Freq: Once | INTRAVENOUS | Status: AC
  Administered 2018-07-06: 09:00:00 10 mg via INTRAVENOUS

## 2018-07-06 MED ORDER — BELLADONNA ALKALOIDS-OPIUM 16.2-60 MG RE SUPP
1.0000 | Freq: Four times a day (QID) | RECTAL | Status: DC | PRN
  Administered 2018-07-06: 23:00:00 1 via RECTAL
  Filled 2018-07-06 (×2): qty 1

## 2018-07-06 NOTE — Progress Notes (Signed)
NAME:  Edward Cooper, MRN:  [id number], DOB:  April 27, 1955, LOS: 4 ADMISSION DATE:  07/08/2018, CONSULTATION DATE:  07/03/2018 REFERRING MD:  [facility], CHIEF COMPLAINT:  seizures  Brief History   25 yoM presenting with witnessed seizures x 3, did not return to baseline and required intubation for airway protection.  Transferred from [facility] to [facility] for continuous EEG.    History of present illness   HPI obtained from medical chart review as patient is intubated on mechanical ventilation.   63 year old male with history of CAD s/p PCI, LV thrombus on coumadin,  PVD, GERD, HTN, HLD, ICM, seizures, tobacco and ETOH abuse who presented from his boarding house with EMS for reported seizure to [facility] ED.  It appears patient is not on any home anticonvulsants.   Reportedly add focal seizure with EMS and noted to be hypertensive 224/124 given ativan. He remained postictal.  Shortly after arrival to ER, he had another seizure, given ativan.  Required NRB to maintain saturations.  CT head was negative for bleed or abnormality.  Loaded with keppra.  Labs noted for INR 1.3, glucose 213, WBC 12, UA neg, UDS positive for THC.  Had ongoing focal seizure activity and unresponsive requiring intubation. CXR with right base infiltrate.  He was transferred to [facility] with Neurology consulting for continuous EEG, PCCM to admit.   Past Medical History  CAD s/p PCI, LV thrombus on coumadin,  PVD, GERD, HTN, HLD, ICM, seizures, tobacco and ETOH abuse  Significant Hospital Events   5/4 Admit 5/5 Sedated on propofol , loaded with Vimpat, Keppra and valproate And Ativan standing dose every 4 hours 5/6 Developed hematuria, Foley blocked with clot and removed and heparin was stopped  5/7 versed gtt due to persistent status  Consults:  Neurology   Procedures:  5/4 ETT >>  Significant Diagnostic Tests:  5/4 [facility] >> chronic changes, no acute abnormality  5/5 EEG >>abnormal, intermittent left hemispheric sharp waves.   LTM EEG  5/5 >> multiple seizures arising from left paracentral frontocentral and central parietal cortex. 5/7 left PLEDs   Micro Data:  5/4 MRSA PCR  >>neg 5/5 respiratory >> neg  Antimicrobials:  5/5 unasyn >>  Interim history/subjective:   Febrile 101 Comatose on Versed drip, on LTM EEG Critically ill, orally intubated     Objective   Blood pressure 115/72, pulse 85, temperature 98.1 F (36.7 C), temperature source Axillary, resp. rate 16, height 5\' 11"  (1.803 m), weight 62.1 kg, SpO2 100 %.    Vent Mode: PRVC FiO2 (%):  [30 %] 30 % Set Rate:  [16 bmp] 16 bmp Vt Set:  [600 mL] 600 mL PEEP:  [5 cmH20] 5 cmH20 Plateau Pressure:  [15 cmH20-19 cmH20] 19 cmH20   Intake/Output Summary (Last 24 hours) at 07/06/2018 2633 Last data filed at 07/06/2018 0600 Gross per 24 hour  Intake 2406.07 ml  Output 1450 ml  Net 956.07 ml   Filed Weights   07/04/18 0500 07/05/18 0500 07/06/18 0500  Weight: 61.6 kg 61.9 kg 62.1 kg    Examination: General:  Critically ill thin adult male, orally intubated, induced coma with Versed HEENT: No pallor, no icterus, no JVD oral ET tube Neuro: does not follow commands, pupils bilateral pinpoint, no meningeal signs CV: SR, no murmur  PULM: even/non-labored, lungs bilaterally clear GI: soft, non-distended, bs active Extremities: warm/dry, no LE edema  Skin: no rashes  Chest x-ray 5/8 personally reviewed mild left basilar atelectasis, no new infiltrates  Resolved Hospital  Problem list    Assessment & Plan:   Acute encephalopathy due to status epilepticus Related to alcohol withdrawal  - THC positive  Hx ETOH abuse P:  Neurology following In Versed coma-being titrated per LTM EEG Keppra, Vimpat and valproate per neurology Seizure precautions Daily thiamine/ folate   Acute respiratory failure RLL infiltrate/ probable aspiration  P:  Spontaneous breathing trials on hold now VAP bundle Prn albuterol    New fever 5/8 RLL infiltrate /  ?aspiration - UA ok P:  Empiric unasyn x 5ds , stop 5/10 Trend WBC / fever curve   AKI -resolved Hematuria-Foley DC'd 5/5 P:  Follow bladder scan for urinary retention due to hematuria, low threshold to place Foley bag since comatose  HTN HLD Hypotensive on propofol - neg trop/ normal EKG P:  Hold home coreg/ lisinopril Continue lipitor  CAD s/p PCI, LV thrombus on coumadin,  PVD, ICM (EF 35-40% 2014) - INR 1.3 Echo this admission does not show LV thrombus P:  Continue brilinta Does not need anticoagulation going forward  Hyperglycemia P:  SSI sensitive CBG q 4  Depression P:  Holding home zoloft   Best practice:  Diet: NPO Pain/Anxiety/Delirium protocol (if indicated): RA SS goal based on seizure, currently -5, Celexa if needed VAP protocol (if indicated): yes DVT prophylaxis: SCDs, resume subcu heparin when able GI prophylaxis: PPI Glucose control: SSI sensitive Mobility: BR Code Status: Full  Family Communication: daughter 5/8 Disposition: ICU  The patient is critically ill with multiple organ systems failure and requires high complexity decision making for assessment and support, frequent evaluation and titration of therapies, application of advanced monitoring technologies and extensive interpretation of multiple databases. Critical Care Time devoted to patient care services described in this note independent of APP/resident  time is 31 minutes.    Cyril Mourningakesh Argusta Mcgann MD. Tonny BollmanFCCP. [facility] Pager [phone] If no response call [phone]     07/06/2018, 8:23 AM

## 2018-07-06 NOTE — Progress Notes (Signed)
Subjective: Intubated and sedated.   Objective: Current vital signs: BP 120/76   Pulse 87   Temp 98.1 F (36.7 C) (Axillary)   Resp 16   Ht 5' 11"  (1.803 m)   Wt 62.1 kg   SpO2 97%   BMI 19.09 kg/m  Vital signs in last 24 hours: Temp:  [98.1 F (36.7 C)-101.4 F (38.6 C)] 98.1 F (36.7 C) (05/08 0800) Pulse Rate:  [84-100] 87 (05/08 0900) Resp:  [16-20] 16 (05/08 0900) BP: (96-147)/(62-90) 120/76 (05/08 0900) SpO2:  [97 %-100 %] 97 % (05/08 0900) FiO2 (%):  [30 %] 30 % (05/08 0754) Weight:  [62.1 kg] 62.1 kg (05/08 0500)  Intake/Output from previous day: 05/07 0701 - 05/08 0700 In: 2549.5 [I.V.:666.6; NG/GT:1200; IV Piggyback:682.9] Out: 1450 [Urine:1450] Intake/Output this shift: No intake/output data recorded. Nutritional status:  Diet Order            Diet NPO time specified  Diet effective now             HEENT: [place]/AT Lungs: Intubated Ext: No edema. Decreased muscle bulk x 4  Neurologic Exam: Intubated and sedated on Versed Ment: Eyes closed with no eye opening to any stimuli. No movement to any stimuli. Does not follow commands or attempt to communicate. Sedated on Versed at a rate of 50.  CN: Pupils unreactive at 2 mm. No blink to threat. No doll's eye reflex. No grimace to noxious.  Motor/Sensory: Increasedextensortone all 4 extremities, worse on the right.  Moves RUE to pinch 1-2/5. No movement of LUE to pinch.  Weakly withdraws BLE to plantar stimulation, more briskly on the right Reflexes: Pathologically brisk reflexes throughout, brisker on right than left.  Right toe upgoing, left toe upgoing.   Lab Results: Results for orders placed or performed during the hospital encounter of 07/07/2018 (from the past 48 hour(s))  Valproic acid level     Status: None   Collection Time: 07/04/18 11:47 AM  Result Value Ref Range   Valproic Acid Lvl 64 50.0 - 100.0 ug/mL    Comment: Performed at [facility] [address], [place], [place] [address]   Glucose, capillary     Status: None   Collection Time: 07/04/18 12:04 PM  Result Value Ref Range   Glucose-Capillary 92 70 - 99 mg/dL   Comment 1 Notify RN    Comment 2 Document in Chart   Glucose, capillary     Status: Abnormal   Collection Time: 07/04/18  3:21 PM  Result Value Ref Range   Glucose-Capillary 104 (H) 70 - 99 mg/dL   Comment 1 Notify RN    Comment 2 Document in Chart   Glucose, capillary     Status: Abnormal   Collection Time: 07/04/18  7:45 PM  Result Value Ref Range   Glucose-Capillary 121 (H) 70 - 99 mg/dL  Glucose, capillary     Status: Abnormal   Collection Time: 07/04/18 11:49 PM  Result Value Ref Range   Glucose-Capillary 149 (H) 70 - 99 mg/dL  Glucose, capillary     Status: Abnormal   Collection Time: 07/05/18  3:16 AM  Result Value Ref Range   Glucose-Capillary 119 (H) 70 - 99 mg/dL  Heparin level (unfractionated)     Status: Abnormal   Collection Time: 07/05/18  4:04 AM  Result Value Ref Range   Heparin Unfractionated <0.10 (L) 0.30 - 0.70 IU/mL    Comment: (NOTE) If heparin results are below expected values, and patient dosage has  been confirmed, suggest follow up testing of antithrombin III levels. Performed at [facility], [place] [address], [place], [place] [address]   CBC     Status: Abnormal   Collection Time: 07/05/18  4:04 AM  Result Value Ref Range   WBC 11.3 (H) 4.0 - 10.5 K/uL   RBC 4.37 4.22 - 5.81 MIL/uL   Hemoglobin 13.1 13.0 - 17.0 g/dL   HCT 40.5 39.0 - 52.0 %   MCV 92.7 80.0 - 100.0 fL   MCH 30.0 26.0 - 34.0 pg   MCHC 32.3 30.0 - 36.0 g/dL   RDW 13.8 11.5 - 15.5 %   Platelets 124 (L) 150 - 400 K/uL   nRBC 0.0 0.0 - 0.2 %    Comment: Performed at [facility], [place] [address] [place], [place] [address]  Basic metabolic panel     Status: Abnormal   Collection Time: 07/05/18  4:04 AM  Result Value Ref Range   Sodium 139 135 - 145 mmol/L   Potassium 3.7 3.5 - 5.1 mmol/L   Chloride 106 98 - 111 mmol/L   CO2 24 22  - 32 mmol/L   Glucose, Bld 129 (H) 70 - 99 mg/dL   BUN 11 8 - 23 mg/dL   Creatinine, Ser 0.94 0.61 - 1.24 mg/dL   Calcium 8.0 (L) 8.9 - 10.3 mg/dL   GFR calc non Af Amer >60 >60 mL/min   GFR calc Af Amer >60 >60 mL/min   Anion gap 9 5 - 15    Comment: Performed at [facility], [place] [address], [place], [place] [address]  Magnesium     Status: None   Collection Time: 07/05/18  4:04 AM  Result Value Ref Range   Magnesium 2.2 1.7 - 2.4 mg/dL    Comment: Performed at [facility] [address], [place], [place] [address]  Phosphorus     Status: Abnormal   Collection Time: 07/05/18  4:04 AM  Result Value Ref Range   Phosphorus 2.4 (L) 2.5 - 4.6 mg/dL    Comment: Performed at [facility] [address], [place], [place] [address]  Glucose, capillary     Status: Abnormal   Collection Time: 07/05/18  7:47 AM  Result Value Ref Range   Glucose-Capillary 107 (H) 70 - 99 mg/dL  Glucose, capillary     Status: Abnormal   Collection Time: 07/05/18 11:12 AM  Result Value Ref Range   Glucose-Capillary 108 (H) 70 - 99 mg/dL  Glucose, capillary     Status: Abnormal   Collection Time: 07/05/18  3:13 PM  Result Value Ref Range   Glucose-Capillary 105 (H) 70 - 99 mg/dL  Glucose, capillary     Status: Abnormal   Collection Time: 07/05/18  7:47 PM  Result Value Ref Range   Glucose-Capillary 103 (H) 70 - 99 mg/dL  Glucose, capillary     Status: Abnormal   Collection Time: 07/05/18 11:22 PM  Result Value Ref Range   Glucose-Capillary 128 (H) 70 - 99 mg/dL  CBC with Differential/Platelet     Status: Abnormal   Collection Time: 07/06/18  3:06 AM  Result Value Ref Range   WBC 8.9 4.0 - 10.5 K/uL   RBC 4.46 4.22 - 5.81 MIL/uL   Hemoglobin 13.4 13.0 - 17.0 g/dL   HCT 41.4 39.0 - 52.0 %   MCV 92.8 80.0 - 100.0 fL   MCH 30.0 26.0 - 34.0 pg   MCHC  32.4 30.0 - 36.0 g/dL   RDW 13.9 11.5 - 15.5 %   Platelets 112 (L) 150 - 400 K/uL    Comment: REPEATED TO VERIFY PLATELET COUNT  CONFIRMED BY SMEAR Immature Platelet Fraction may be clinically indicated, consider ordering this additional test [id number]    nRBC 0.0 0.0 - 0.2 %   Neutrophils Relative % 78 %   Neutro Abs 6.9 1.7 - 7.7 K/uL   Lymphocytes Relative 8 %   Lymphs Abs 0.7 0.7 - 4.0 K/uL   Monocytes Relative 13 %   Monocytes Absolute 1.2 (H) 0.1 - 1.0 K/uL   Eosinophils Relative 1 %   Eosinophils Absolute 0.1 0.0 - 0.5 K/uL   Basophils Relative 0 %   Basophils Absolute 0.0 0.0 - 0.1 K/uL   Immature Granulocytes 0 %   Abs Immature Granulocytes 0.04 0.00 - 0.07 K/uL    Comment: Performed at [facility] [address] [place], [place] [address]  Basic metabolic panel     Status: Abnormal   Collection Time: 07/06/18  3:06 AM  Result Value Ref Range   Sodium 139 135 - 145 mmol/L   Potassium 3.8 3.5 - 5.1 mmol/L   Chloride 109 98 - 111 mmol/L   CO2 22 22 - 32 mmol/L   Glucose, Bld 107 (H) 70 - 99 mg/dL   BUN 9 8 - 23 mg/dL   Creatinine, Ser 0.83 0.61 - 1.24 mg/dL   Calcium 8.3 (L) 8.9 - 10.3 mg/dL   GFR calc non Af Amer >60 >60 mL/min   GFR calc Af Amer >60 >60 mL/min   Anion gap 8 5 - 15    Comment: Performed at [facility], [place] [address], [place], [place] [address]  Magnesium     Status: None   Collection Time: 07/06/18  3:06 AM  Result Value Ref Range   Magnesium 2.0 1.7 - 2.4 mg/dL    Comment: Performed at [facility] [address] [place], [place] [address]  Phosphorus     Status: None   Collection Time: 07/06/18  3:06 AM  Result Value Ref Range   Phosphorus 2.8 2.5 - 4.6 mg/dL    Comment: Performed at [facility] [address] [place], [place] [address]  Glucose, capillary     Status: Abnormal   Collection Time: 07/06/18  3:27 AM  Result Value Ref Range   Glucose-Capillary 101 (H) 70 - 99 mg/dL  Glucose, capillary     Status: Abnormal   Collection Time: 07/06/18  7:21 AM  Result Value Ref Range   Glucose-Capillary 119 (H) 70 - 99 mg/dL    Recent  Results (from the past 240 hour(s))  SARS Coronavirus 2 (CEPHEID- Performed in [facility] hospital lab), Hosp Order     Status: None   Collection Time: 07/06/2018  7:26 PM  Result Value Ref Range Status   SARS Coronavirus 2 NEGATIVE NEGATIVE Final    Comment: (NOTE) If result is NEGATIVE SARS-CoV-2 target nucleic acids are NOT DETECTED. The SARS-CoV-2 RNA is generally detectable in upper and lower  respiratory specimens during the acute phase of infection. The lowest  concentration of SARS-CoV-2 viral copies this assay can detect is 250  copies / mL. A negative result does not preclude SARS-CoV-2 infection  and should not be used as the sole basis for treatment or other  patient management decisions.  A negative result may occur with  improper specimen collection / handling,  submission of specimen other  than nasopharyngeal swab, presence of viral mutation(s) within the  areas targeted by this assay, and inadequate number of viral copies  (<250 copies / mL). A negative result must be combined with clinical  observations, patient history, and epidemiological information. If result is POSITIVE SARS-CoV-2 target nucleic acids are DETECTED. The SARS-CoV-2 RNA is generally detectable in upper and lower  respiratory specimens dur ing the acute phase of infection.  Positive  results are indicative of active infection with SARS-CoV-2.  Clinical  correlation with patient history and other diagnostic information is  necessary to determine patient infection status.  Positive results do  not rule out bacterial infection or co-infection with other viruses. If result is PRESUMPTIVE POSTIVE SARS-CoV-2 nucleic acids MAY BE PRESENT.   A presumptive positive result was obtained on the submitted specimen  and confirmed on repeat testing.  While 2019 novel coronavirus  (SARS-CoV-2) nucleic acids may be present in the submitted sample  additional confirmatory testing may be necessary for epidemiological   and / or clinical management purposes  to differentiate between  SARS-CoV-2 and other Sarbecovirus currently known to infect humans.  If clinically indicated additional testing with an alternate test  methodology [phone]) is advised. The SARS-CoV-2 RNA is generally  detectable in upper and lower respiratory sp ecimens during the acute  phase of infection. The expected result is Negative. Fact Sheet for Patients:  StrictlyIdeas.no Fact Sheet for Healthcare Providers: BankingDealers.co.za This test is not yet approved or cleared by the [place] FDA and has been authorized for detection and/or diagnosis of SARS-CoV-2 by FDA under an Emergency Use Authorization (EUA).  This EUA will remain in effect (meaning this test can be used) for the duration of the COVID-19 declaration under Section 564(b)(1) of the Act, 21 U.S.C. section 360bbb-3(b)(1), unless the authorization is terminated or revoked sooner. Performed at [facility], [address] [place], [place] [address]   MRSA PCR Screening     Status: None   Collection Time: 07/07/2018 11:38 PM  Result Value Ref Range Status   MRSA by PCR NEGATIVE NEGATIVE Final    Comment:        The GeneXpert MRSA Assay (FDA approved for NASAL specimens only), is one component of a comprehensive MRSA colonization surveillance program. It is not intended to diagnose MRSA infection nor to guide or monitor treatment for MRSA infections. Performed at [facility], [place] [address], [place], [place] [address]   Culture, respiratory (non-expectorated)     Status: None   Collection Time: 07/03/18  8:36 AM  Result Value Ref Range Status   Specimen Description TRACHEAL ASPIRATE  Final   Special Requests NONE  Final   Gram Stain   Final    ABUNDANT WBC PRESENT, PREDOMINANTLY PMN MODERATE GRAM NEGATIVE RODS MODERATE GRAM POSITIVE COCCI    Culture   Final    FEW Consistent with normal  respiratory flora. Performed at [facility], [place] [address], [place], [place] [address]    Report Status 07/05/2018 FINAL  Final    Lipid Panel Recent Labs    07/04/18 0401  TRIG 56    Studies/Results: Dg Chest Port 1 View  Result Date: 07/06/2018 CLINICAL DATA:  Respiratory failure.  Endotracheal tube present. EXAM: PORTABLE CHEST 1 VIEW COMPARISON:  07/04/2018 FINDINGS: Endotracheal tube is 4.1 cm above the carina. Nasogastric tube extends into the abdomen. New densities along the medial left lung base are suggestive for atelectasis. Otherwise, the  lungs are clear. Negative for a pneumothorax. Heart size is normal. IMPRESSION: New densities along the medial left lung base. Favor atelectasis in this region. Endotracheal tube is appropriately positioned. Electronically Signed   By: Markus Daft M.D.   On: 07/06/2018 08:16    Medications:  Scheduled: . atorvastatin  80 mg Per Tube q1800  . chlorhexidine gluconate (MEDLINE KIT)  15 mL Mouth Rinse BID  . feeding supplement (PRO-STAT SUGAR FREE 64)  30 mL Per Tube Daily  . folic acid  1 mg Intravenous Daily  . insulin aspart  0-9 Units Subcutaneous Q4H  . mouth rinse  15 mL Mouth Rinse 10 times per day  . pantoprazole sodium  40 mg Per Tube Q1200  . thiamine  100 mg Intravenous Daily  . ticagrelor  90 mg Oral BID   Continuous: . sodium chloride    . sodium chloride Stopped (07/04/18 1044)  . ampicillin-sulbactam (UNASYN) IV 3 g (07/06/18 0351)  . feeding supplement (OSMOLITE 1.5 CAL) 1,000 mL (07/05/18 2129)  . lacosamide (VIMPAT) IV Stopped (07/05/18 2231)  . levETIRAcetam Stopped (07/06/18 0539)  . midazolam (VERSED) infusion 70 mg/hr (07/06/18 0912)  . norepinephrine (LEVOPHED) Adult infusion 2 mcg/min (07/04/18 1400)  . valproate sodium 300 mg (07/06/18 0604)    Assessment:47 year oldwith a history of EtOH abuse and undetectable alcohol level on initial evaluation at [facility] who wasbrought in from a group home after  having seizure activity.Had witnessed tonic-clonic episode in the emergency room at [facility], for which he was given benzodiazepines and loaded with Keppra.He had a total of 3 seizures without return to baseline, consistent with status epilepticus.Mentation did not returnto baseline requiring to be intubationfor airway protection. [facility] level [facility]. Overall presentation most consistent with status epilepticus, now resolved, followed by prolonged postictal state. 1.LTM EEG report reviewed (report from this AM, 5/8).This day 4 of continuous EEG monitoring with simultaneous video monitoring did not demonstrate any clinical or subclinical seizures.  Left hemispheric periodic lateralized epileptiform discharges have also resolved. Occasionally left central parietal sharp waves were present suggestive of some degree of cortical irritability.    2.Based on the EEG report, resolution of the electrographic seizures continues while on midazolam. Will increase midazolam to a rate of 70 foillowing a bolus osf 10, towards goal of burst suppression. After 24-48 hours of burst suppression will taper off Versed while on LTM EEG.   3.He has a documented history of seizures on his chart but no antiepileptics listed as an outpatient in [facility].He was loaded with Keppra in the outside ER and given benzodiazepines to break the seizure.He is now on Keppraat1000 mg BID,valproic acid at5 mg/kgIV TIDand Vimpatat 200 mg IV BID.Valproic acid level from Wednesday was therapeutic at 37. 4.Other considerations for his presentation include stroke as he has a history of LV thrombus and was on Coumadin as a listed home medication. 5. Additional problem list includes leukocytosis and RLL infiltrate thought possibly to be due to aspiration. 6. CT head revealed chronic changes without acute abnormality  Recommendations: 1.Continuing LTM EEG 2. Continue  thiamine.  3. Increase Versed from to 70 mg/hr after a bolus of 10 mg x 1 now. 4. Afterseizures are stabilized andEEG is discontinued, will need to obtain MRI brain. 5. Unasyn per ICU team for possible infection. 6.Heparin infusionwas stopped due to bleeding around foley and straight blood from condom cath. He was on thisfor LV thrombus.Still on Brilinta.  7.Evaluate for stroke with  MRI given the history of LV thrombus.    35 minutes spent in the emergent neurological evaluation and management of this critically ill patient.    LOS: 4 days   @Electronically  signed: Dr. Kerney Elbe 07/06/2018  9:12 AM

## 2018-07-06 NOTE — Procedures (Signed)
  Electroencephalogram report- LTM with VIDEO  Ordering Physician : Dr Jodi Mourning  Beginning time:  07/05/18 at 0730 Ending time:  07/06/18 at 0730 CPT/type : 95720  Day of study: day 4   Technical Description: The EEG was performed using standard setting per the guidelines of American Clinical Neurophysiology Society (ACNS).    A minimum of 21 electrodes were placed on scalp according to the International 10-20 or/and 10-10 Systems. Supplemental electrodes were placed as needed. Single EKG electrode was also used to detect cardiac arrhythmia. Patient's behavior was continuously recorded on video simultaneously with EEG. A minimum of 16 channels were used for data display. Each epoch of study was reviewed manually daily and as needed using standard referential and bipolar montages. Computerized quantitative EEG analysis (such as compressed spectral array analysis, trending, automated spike & seizure detection) were used as indicated.    Spike detection: ON  Seizure detection: ON   This  continuous  EEG monitoring with simultaneous video monitoring was performed for this patient with convulsions to rule out clinical and subclinical geographic seizures  medications: As per EMR  Day 1 There was no pushbutton activations events during this recording Background activities were marked by continuous reactive background activities predominantly in the theta range with superimposed faster frequencies.  Stage changes were present.  Superimposed left hemispheric sharp waves and spikes were present maximum negativity in a left frontocentral and central parietal cortex at times with negative field extending to the left frontal polar as well as right posterior paracentral cortex. Occasionally runs of poorly formed left periodic lateralized epileptiform discharges also present  Multiple electrographic seizures were present arising from left paracentral cortex remain confined to the left hemisphere.  No obvious  clinical accompaniment.  Day 2 frequent electrographic seizures present during first half of the recording.  Frequency every 5 minutes gradually improving to the every 3 to 4/h.  Last well-formed electrographic seizure around 5 PM.  Since that time background activities marked by a left hemispheric periodic epileptiform discharges left central parietal paracentral spike and polyspike and wave discharges.   Electrographic seizures marked by buildup of rhythmic synchronized sharp waves in the left central parietal cortex with gradual recruitment of adjacent left central parietal frontocentral and anterior frontal cortex remain confined to the left hemisphere.  Occasionally electrographic seizures were accompanied by right arm jerking and right leg jerking but not always. Left hemispheric periodic epileptiform discharges were present with frequency every 1 to 2 seconds in form of spike and wave and polyspike and wave discharges.  Maximum negativity left central parietal and frontocentral cortex.  Day 3: No clinical or subclinical seizures present throughout the recording.  Persistent left hemispheric periodic lateralized epileptiform discharges present with maximum negativity in the left posterior quadrant.  Day 4: No clinical or subclinical seizures present throughout the recording.  Background activities marked by continuous burst and attenuation pattern with occasional left central parietal sharp waves.  Left periodic lateralized epileptiform discharges were not present.  Clinical interpretation: This day 4 of continuous EEG monitoring with simultaneous video monitoring did not demonstrate any clinical or subclinical seizures.  Left hemispheric periodic lateralized epileptiform discharges have also resolved.  Occasionally left central parietal sharp waves were present suggestive of some degree of cortical irritability.  Continuous monitoring is recommended to ensure resolution of seizures and achievement  of burst suppression if desired.  Clinical correlation is advised.

## 2018-07-06 NOTE — Progress Notes (Signed)
Pt urinating around foley catheter, bladder scan revealed 41ml. Notified MD- orders for suppository for possible bladder spasms. Will continue to monitor.

## 2018-07-06 NOTE — Progress Notes (Signed)
EEG reviewed. The tracings are improved but patient is still not in burst suppression.   Bolusing 10 mg Versed and increasing infusion rate to 90 mg/hr.   Electronically signed: Dr. Caryl Pina

## 2018-07-06 NOTE — Progress Notes (Signed)
EEG maint done/ continue to monitor

## 2018-07-07 LAB — CBC
HCT: 42.5 % (ref 39.0–52.0)
Hemoglobin: 13.5 g/dL (ref 13.0–17.0)
MCH: 29.9 pg (ref 26.0–34.0)
MCHC: 31.8 g/dL (ref 30.0–36.0)
MCV: 94 fL (ref 80.0–100.0)
Platelets: 107 10*3/uL — ABNORMAL LOW (ref 150–400)
RBC: 4.52 MIL/uL (ref 4.22–5.81)
RDW: 14.2 % (ref 11.5–15.5)
WBC: 16.2 10*3/uL — ABNORMAL HIGH (ref 4.0–10.5)
nRBC: 0 % (ref 0.0–0.2)

## 2018-07-07 LAB — GLUCOSE, CAPILLARY
Glucose-Capillary: 99 mg/dL (ref 70–99)
Glucose-Capillary: 90 mg/dL (ref 70–99)
Glucose-Capillary: 81 mg/dL (ref 70–99)
Glucose-Capillary: 92 mg/dL (ref 70–99)
Glucose-Capillary: 87 mg/dL (ref 70–99)
Glucose-Capillary: 79 mg/dL (ref 70–99)

## 2018-07-07 LAB — BASIC METABOLIC PANEL
Anion gap: 14 (ref 5–15)
BUN: 8 mg/dL (ref 8–23)
CO2: 20 mmol/L — ABNORMAL LOW (ref 22–32)
Calcium: 8 mg/dL — ABNORMAL LOW (ref 8.9–10.3)
Chloride: 105 mmol/L (ref 98–111)
Creatinine, Ser: 0.87 mg/dL (ref 0.61–1.24)
GFR calc Af Amer: >60 mL/min (ref >60)
GFR calc non Af Amer: >60 mL/min (ref >60)
Glucose, Bld: 80 mg/dL (ref 70–99)
Potassium: 4.4 mmol/L (ref 3.5–5.1)
Sodium: 139 mmol/L (ref 135–145)

## 2018-07-07 MED ORDER — MIDAZOLAM BOLUS VIA INFUSION
10.0000 mg | Freq: Once | INTRAVENOUS | Status: AC
  Administered 2018-07-07: 10 mg via INTRAVENOUS
  Filled 2018-07-07: qty 10

## 2018-07-07 MED ORDER — TICAGRELOR 90 MG PO TABS
90.0000 mg | ORAL_TABLET | Freq: Two times a day (BID) | ORAL | Status: DC
  Administered 2018-07-07 – 2018-07-18 (×23): 90 mg
  Filled 2018-07-07 (×23): qty 1

## 2018-07-07 MED ORDER — MIDAZOLAM BOLUS VIA INFUSION
10.0000 mg | Freq: Once | INTRAVENOUS | Status: AC
  Administered 2018-07-07: 10 mg via INTRAVENOUS

## 2018-07-07 NOTE — Progress Notes (Signed)
Subjective: Sedated and intubated.   Objective: Current vital signs: BP 114/72   Pulse 86   Temp 98.7 F (37.1 C) (Axillary)   Resp 16   Ht 5' 11"  (1.803 m)   Wt 66.7 kg   SpO2 94%   BMI 20.51 kg/m  Vital signs in last 24 hours: Temp:  [97.7 F (36.5 C)-101.9 F (38.8 C)] 98.7 F (37.1 C) (05/09 1600) Pulse Rate:  [81-109] 86 (05/09 1900) Resp:  [16-25] 16 (05/09 1900) BP: (96-159)/(62-90) 114/72 (05/09 1900) SpO2:  [93 %-100 %] 94 % (05/09 1900) FiO2 (%):  [30 %] 30 % (05/09 1617) Weight:  [66.7 kg] 66.7 kg (05/09 0456)  Intake/Output from previous day: 05/08 0701 - 05/09 0700 In: 3696.5 [I.V.:1864.6; NG/GT:600; IV Piggyback:1111.9] Out: 2250 [Urine:2050; Stool:200] Intake/Output this shift: No intake/output data recorded. Nutritional status:  Diet Order            Diet NPO time specified  Diet effective now              HEENT: [place]/AT Lungs: Intubated Ext: No edema. Decreased muscle bulk x 4  Neurologic Exam: Intubated and sedated on Versed (AM rounding exam) Ment:Eyes closed with no eye opening to any stimuli. No movement to any stimuli. Does not follow commands or attempt to communicate.Sedated on Versed at a rate of 90 mg/hr. CN: Pupils unreactive at 2 mm. No blink to threat. No doll's eye reflex. No grimace to noxious.  Motor/Sensory: Tone decreased in all 4 extremities. Improved since yesterday on higher Versed sedation. No movement of upper extremities to light pinch. Weakly withdraws BLE to plantar stimulation. Reflexes: Pathologically brisk reflexes throughout, brisker on right than left.  Right toe upgoing, left toeupgoing.  Lab Results: Results for orders placed or performed during the hospital encounter of 07/01/2018 (from the past 48 hour(s))  Glucose, capillary     Status: Abnormal   Collection Time: 07/05/18 11:22 PM  Result Value Ref Range   Glucose-Capillary 128 (H) 70 - 99 mg/dL  CBC with Differential/Platelet     Status: Abnormal    Collection Time: 07/06/18  3:06 AM  Result Value Ref Range   WBC 8.9 4.0 - 10.5 K/uL   RBC 4.46 4.22 - 5.81 MIL/uL   Hemoglobin 13.4 13.0 - 17.0 g/dL   HCT 41.4 39.0 - 52.0 %   MCV 92.8 80.0 - 100.0 fL   MCH 30.0 26.0 - 34.0 pg   MCHC 32.4 30.0 - 36.0 g/dL   RDW 13.9 11.5 - 15.5 %   Platelets 112 (L) 150 - 400 K/uL    Comment: REPEATED TO VERIFY PLATELET COUNT CONFIRMED BY SMEAR Immature Platelet Fraction may be clinically indicated, consider ordering this additional test [id number]    nRBC 0.0 0.0 - 0.2 %   Neutrophils Relative % 78 %   Neutro Abs 6.9 1.7 - 7.7 K/uL   Lymphocytes Relative 8 %   Lymphs Abs 0.7 0.7 - 4.0 K/uL   Monocytes Relative 13 %   Monocytes Absolute 1.2 (H) 0.1 - 1.0 K/uL   Eosinophils Relative 1 %   Eosinophils Absolute 0.1 0.0 - 0.5 K/uL   Basophils Relative 0 %   Basophils Absolute 0.0 0.0 - 0.1 K/uL   Immature Granulocytes 0 %   Abs Immature Granulocytes 0.04 0.00 - 0.07 K/uL    Comment: Performed at [facility], [address], [place], [place] [address]  Basic metabolic panel     Status: Abnormal   Collection Time:  07/06/18  3:06 AM  Result Value Ref Range   Sodium 139 135 - 145 mmol/L   Potassium 3.8 3.5 - 5.1 mmol/L   Chloride 109 98 - 111 mmol/L   CO2 22 22 - 32 mmol/L   Glucose, Bld 107 (H) 70 - 99 mg/dL   BUN 9 8 - 23 mg/dL   Creatinine, Ser 0.83 0.61 - 1.24 mg/dL   Calcium 8.3 (L) 8.9 - 10.3 mg/dL   GFR calc non Af Amer >60 >60 mL/min   GFR calc Af Amer >60 >60 mL/min   Anion gap 8 5 - 15    Comment: Performed at [facility] [address], [place], [place] [address]  Magnesium     Status: None   Collection Time: 07/06/18  3:06 AM  Result Value Ref Range   Magnesium 2.0 1.7 - 2.4 mg/dL    Comment: Performed at [facility] [address] [place], [place] [address]  Phosphorus     Status: None   Collection Time: 07/06/18  3:06 AM  Result Value Ref Range   Phosphorus 2.8 2.5 - 4.6 mg/dL    Comment: Performed  at [facility] [address] [place], [place] [address]  Glucose, capillary     Status: Abnormal   Collection Time: 07/06/18  3:27 AM  Result Value Ref Range   Glucose-Capillary 101 (H) 70 - 99 mg/dL  Glucose, capillary     Status: Abnormal   Collection Time: 07/06/18  7:21 AM  Result Value Ref Range   Glucose-Capillary 119 (H) 70 - 99 mg/dL  Glucose, capillary     Status: None   Collection Time: 07/06/18 11:04 AM  Result Value Ref Range   Glucose-Capillary 86 70 - 99 mg/dL  Glucose, capillary     Status: None   Collection Time: 07/06/18  3:24 PM  Result Value Ref Range   Glucose-Capillary 90 70 - 99 mg/dL  Glucose, capillary     Status: Abnormal   Collection Time: 07/06/18  7:38 PM  Result Value Ref Range   Glucose-Capillary 113 (H) 70 - 99 mg/dL  Glucose, capillary     Status: Abnormal   Collection Time: 07/06/18 11:14 PM  Result Value Ref Range   Glucose-Capillary 112 (H) 70 - 99 mg/dL  Glucose, capillary     Status: None   Collection Time: 07/07/18  3:10 AM  Result Value Ref Range   Glucose-Capillary 99 70 - 99 mg/dL  CBC     Status: Abnormal   Collection Time: 07/07/18  3:55 AM  Result Value Ref Range   WBC 16.2 (H) 4.0 - 10.5 K/uL   RBC 4.52 4.22 - 5.81 MIL/uL   Hemoglobin 13.5 13.0 - 17.0 g/dL   HCT 42.5 39.0 - 52.0 %   MCV 94.0 80.0 - 100.0 fL   MCH 29.9 26.0 - 34.0 pg   MCHC 31.8 30.0 - 36.0 g/dL   RDW 14.2 11.5 - 15.5 %   Platelets 107 (L) 150 - 400 K/uL    Comment: Immature Platelet Fraction may be clinically indicated, consider ordering this additional test [id number] CONSISTENT WITH PREVIOUS RESULT    nRBC 0.0 0.0 - 0.2 %    Comment: Performed at [facility], [place]. [address] [place],  [address]  Basic metabolic panel     Status: Abnormal   Collection Time: 07/07/18  3:55 AM  Result Value Ref Range   Sodium 139 135 - 145 mmol/L  Potassium 4.4 3.5 - 5.1 mmol/L   Chloride 105 98 - 111 mmol/L   CO2 20 (L) 22 - 32 mmol/L    Glucose, Bld 80 70 - 99 mg/dL   BUN 8 8 - 23 mg/dL   Creatinine, Ser 0.87 0.61 - 1.24 mg/dL   Calcium 8.0 (L) 8.9 - 10.3 mg/dL   GFR calc non Af Amer >60 >60 mL/min   GFR calc Af Amer >60 >60 mL/min   Anion gap 14 5 - 15    Comment: Performed at [facility] [address], [place], [place] [address]  Glucose, capillary     Status: None   Collection Time: 07/07/18  7:21 AM  Result Value Ref Range   Glucose-Capillary 90 70 - 99 mg/dL   Comment 1 Notify RN    Comment 2 Document in Chart   Glucose, capillary     Status: None   Collection Time: 07/07/18 11:38 AM  Result Value Ref Range   Glucose-Capillary 81 70 - 99 mg/dL   Comment 1 Notify RN    Comment 2 Document in Chart   Glucose, capillary     Status: None   Collection Time: 07/07/18  3:38 PM  Result Value Ref Range   Glucose-Capillary 92 70 - 99 mg/dL   Comment 1 Notify RN    Comment 2 Document in Chart   Glucose, capillary     Status: None   Collection Time: 07/07/18  7:25 PM  Result Value Ref Range   Glucose-Capillary 87 70 - 99 mg/dL    Recent Results (from the past 240 hour(s))  SARS Coronavirus 2 (CEPHEID- Performed in [facility]), Hosp Order     Status: None   Collection Time: 07/18/2018  7:26 PM  Result Value Ref Range Status   SARS Coronavirus 2 NEGATIVE NEGATIVE Final    Comment: (NOTE) If result is NEGATIVE SARS-CoV-2 target nucleic acids are NOT DETECTED. The SARS-CoV-2 RNA is generally detectable in upper and lower  respiratory specimens during the acute phase of infection. The lowest  concentration of SARS-CoV-2 viral copies this assay can detect is 250  copies / mL. A negative result does not preclude SARS-CoV-2 infection  and should not be used as the sole basis for treatment or other  patient management decisions.  A negative result may occur with  improper specimen collection / handling, submission of specimen other  than nasopharyngeal swab, presence of viral mutation(s) within the   areas targeted by this assay, and inadequate number of viral copies  (<250 copies / mL). A negative result must be combined with clinical  observations, patient history, and epidemiological information. If result is POSITIVE SARS-CoV-2 target nucleic acids are DETECTED. The SARS-CoV-2 RNA is generally detectable in upper and lower  respiratory specimens dur ing the acute phase of infection.  Positive  results are indicative of active infection with SARS-CoV-2.  Clinical  correlation with patient history and other diagnostic information is  necessary to determine patient infection status.  Positive results do  not rule out bacterial infection or co-infection with other viruses. If result is PRESUMPTIVE POSTIVE SARS-CoV-2 nucleic acids MAY BE PRESENT.   A presumptive positive result was obtained on the submitted specimen  and confirmed on repeat testing.  While 2019 novel coronavirus  (SARS-CoV-2) nucleic acids may be present in the submitted sample  additional confirmatory testing may be necessary for epidemiological  and / or clinical management purposes  to differentiate between  SARS-CoV-2 and  other Sarbecovirus currently known to infect humans.  If clinically indicated additional testing with an alternate test  methodology [phone]) is advised. The SARS-CoV-2 RNA is generally  detectable in upper and lower respiratory sp ecimens during the acute  phase of infection. The expected result is Negative. Fact Sheet for Patients:  StrictlyIdeas.no Fact Sheet for Healthcare Providers: BankingDealers.co.za This test is not yet approved or cleared by the [place] FDA and has been authorized for detection and/or diagnosis of SARS-CoV-2 by FDA under an Emergency Use Authorization (EUA).  This EUA will remain in effect (meaning this test can be used) for the duration of the COVID-19 declaration under Section 564(b)(1) of the Act, 21  U.S.C. section 360bbb-3(b)(1), unless the authorization is terminated or revoked sooner. Performed at [facility], [address] [place], [place] [address]   MRSA PCR Screening     Status: None   Collection Time: 07/27/2018 11:38 PM  Result Value Ref Range Status   MRSA by PCR NEGATIVE NEGATIVE Final    Comment:        The GeneXpert MRSA Assay (FDA approved for NASAL specimens only), is one component of a comprehensive MRSA colonization surveillance program. It is not intended to diagnose MRSA infection nor to guide or monitor treatment for MRSA infections. Performed at [facility], [place] [address] [place], [place] [address]   Culture, respiratory (non-expectorated)     Status: None   Collection Time: 07/03/18  8:36 AM  Result Value Ref Range Status   Specimen Description TRACHEAL ASPIRATE  Final   Special Requests NONE  Final   Gram Stain   Final    ABUNDANT WBC PRESENT, PREDOMINANTLY PMN MODERATE GRAM NEGATIVE RODS MODERATE GRAM POSITIVE COCCI    Culture   Final    FEW Consistent with normal respiratory flora. Performed at [facility], [place] [address] [place], [place] [address]    Report Status 07/05/2018 FINAL  Final    Lipid Panel No results for input(s): CHOL, TRIG, HDL, CHOLHDL, VLDL, LDLCALC in the last 72 hours.  Studies/Results: Dg Chest Port 1 View  Result Date: 07/06/2018 CLINICAL DATA:  Respiratory failure.  Endotracheal tube present. EXAM: PORTABLE CHEST 1 VIEW COMPARISON:  07/04/2018 FINDINGS: Endotracheal tube is 4.1 cm above the carina. Nasogastric tube extends into the abdomen. New densities along the medial left lung base are suggestive for atelectasis. Otherwise, the lungs are clear. Negative for a pneumothorax. Heart size is normal. IMPRESSION: New densities along the medial left lung base. Favor atelectasis in this region. Endotracheal tube is appropriately positioned. Electronically Signed   By: Markus Daft M.D.   On:  07/06/2018 08:16    Medications:  Scheduled: . atorvastatin  80 mg Per Tube q1800  . chlorhexidine gluconate (MEDLINE KIT)  15 mL Mouth Rinse BID  . feeding supplement (PRO-STAT SUGAR FREE 64)  30 mL Per Tube Daily  . folic acid  1 mg Intravenous Daily  . insulin aspart  0-9 Units Subcutaneous Q4H  . mouth rinse  15 mL Mouth Rinse 10 times per day  . pantoprazole sodium  40 mg Per Tube Q1200  . thiamine  100 mg Intravenous Daily  . ticagrelor  90 mg Per Tube BID   Continuous: . sodium chloride    . sodium chloride Stopped (07/04/18 1044)  . ampicillin-sulbactam (UNASYN) IV Stopped (07/07/18 1621)  . feeding supplement (OSMOLITE 1.5 CAL) Stopped (07/06/18 1600)  . lacosamide (VIMPAT) IV 90 mL/hr at 07/07/18 1800  .  levETIRAcetam Stopped (07/07/18 1736)  . midazolam (VERSED) infusion 130 mg/hr (07/07/18 1947)  . norepinephrine (LEVOPHED) Adult infusion Stopped (07/06/18 1546)  . valproate sodium Stopped (07/07/18 1544)   EEG report from this AM:  This day 5 of continuous EEG monitoring with simultaneous video monitoring did not demonstrate any clinical or subclinical seizures.  Left frontocentral central parietal poorly formed sharply still present suggestive of some degree of cortical irritability in that region.  Continuous monitoring is recommended to ensure stability and resolution of seizures.  Clinical correlation is advised.   Assessment:8 year oldwith a history of EtOH abuse and undetectable alcohol level on initial evaluation [facility] who wasbrought in from a group home after having seizure activity.Had witnessed tonic-clonic episode in the emergency room at [facility], for which he was given benzodiazepines and loaded with Keppra.He had a total of 3 seizures without return to baseline, consistent with status epilepticus.Mentation did not returnto baseline requiring to be intubationfor airway protection. [facility]  [facility]. 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 Now no longer with electrographic status, but requires upward titration of Versed towards goal of resolving irritative foci in left frontocentral and central parietal regions on EEG, towards the goal of non-recurrence of seizures following weaning off sedatives.  1.LTM EEG report reviewed: Left frontocentral central parietal poorly formed sharply still present suggestive of some degree of cortical irritability in that region.   2.Based on the EEG report, resolution of the electrographic seizurescontinues while on midazolam. Increased midazolam to a rate of 110 foillowing a bolus of 10 during morning rounds today, towards goal of burst suppression. After 24-48 hours of burst suppression will taper off Versed while on LTM EEG. 3.He has a documented history of seizures on his chart but no antiepileptics listedas an outpatient in Epic.He was loaded with Keppra in the outside ER and given benzodiazepines to break the seizure.He is now on Keppraat1000 mg BID,valproic acid at5 mg/kgIV TIDand Vimpatat 200 mg IV BID.Valproic acid level from Wednesday was therapeutic at 46. 4.Other considerationsfor his presentationincluded stroke as he has a history of LV thrombus and wason Coumadinas a listed home medication. This is felt to be unlikely as the etiology for his presentation but will obtain MRI when EEG is discontinued.  5. Additional problem list includes leukocytosis and RLL infiltrate thought possibly to be due to aspiration. 6. CT head revealed chronic changes without acute abnormality  Recommendations: 1.Continuing LTM EEG 2. Continue thiamine.  3.Increased Versed from to 110 mg/hr after a bolus of 10 mg during AM rounds. Subsequently increased to 130 during evening rounds to further optimize EEG. 4. Afterseizures are stabilized  andEEG is discontinued, will need to obtain MRI brain. 5. Unasyn per ICU team for possible infection. 6.Heparin infusionwas stopped due to bleeding around foley and straight blood from condom cath. He was on thisfor LV thrombus.Still on Brilinta.  7.Evaluate for strokewith MRIafter EEG leads are removed, given the history of LV thrombus.  40 minutes spent in the neurological evaluation and management of this critically ill patient. Time spent consisted of AM and evening rounds, EEG review and coordination of care.    LOS: 5 days   @Electronically  signed: Dr. Kerney Elbe 07/07/2018  7:57 PM

## 2018-07-07 NOTE — Progress Notes (Signed)
NAME:  Edward NashJeffrey D Donoso, MRN:  [id number], DOB:  06-22-1955, LOS: 5 ADMISSION DATE:  07/18/2018, CONSULTATION DATE:  07/15/2018 REFERRING MD:  [facility], CHIEF COMPLAINT:  seizures  Brief History   [age] yoM presenting with witnessed seizures x 3, did not return to baseline and required intubation for airway protection.  [facility].    History of present illness   HPI obtained from medical chart review as patient is intubated on mechanical ventilation.   63 year old male with history of CAD s/p PCI, LV thrombus on coumadin,  PVD, GERD, HTN, HLD, ICM, seizures, tobacco and ETOH abuse who presented from his boarding house with EMS for reported seizure [facility].  It appears patient is not on any home anticonvulsants.   Reportedly add focal seizure with EMS and noted to be hypertensive 224/124 given ativan. He remained postictal.  Shortly after arrival to ER, he had another seizure, given ativan.  Required NRB to maintain saturations.  CT head was negative for bleed or abnormality.  Loaded with keppra.  Labs noted for INR 1.3, glucose 213, WBC 12, UA neg, UDS positive for THC.  Had ongoing focal seizure activity and unresponsive requiring intubation. CXR with right base infiltrate.  [facility]rology consulting for continuous EEG, PCCM to admit.   Past Medical History  CAD s/p PCI, LV thrombus on coumadin,  PVD, GERD, HTN, HLD, ICM, seizures, tobacco and ETOH abuse  Significant Hospital Events   5/4 Admit 5/5 Sedated on propofol , loaded with Vimpat, Keppra and valproate And Ativan standing dose every 4 hours 5/6 Developed hematuria, Foley blocked with clot and removed and heparin was stopped  5/7 versed gtt due to persistent status  Consults:  Neurology   Procedures:  5/4 ETT >>  Significant Diagnostic Tests:  5/4 [facility] >> chronic changes, no acute abnormality  5/5 EEG >>abnormal, intermittent left hemispheric sharp waves.   LTM EEG  5/5 >> multiple seizures arising from left paracentral frontocentral and central parietal cortex. 5/7 left PLEDs   Micro Data:  5/4 MRSA PCR  >>neg 5/5 respiratory >> neg  Antimicrobials:  5/5 unasyn >>  Interim history/subjective:  Remains heavily sedated with Versed 9 mg an hour     Objective   Blood pressure 137/78, pulse 88, temperature 98.7 F (37.1 C), temperature source Oral, resp. rate 16, height 5\' 11"  (1.803 m), weight 66.7 kg, SpO2 98 %.    Vent Mode: PRVC FiO2 (%):  [30 %] 30 % Set Rate:  [16 bmp] 16 bmp Vt Set:  [600 mL] 600 mL PEEP:  [5 cmH20] 5 cmH20 Plateau Pressure:  [16 cmH20-20 cmH20] 16 cmH20   Intake/Output Summary (Last 24 hours) at 07/07/2018 0725 Last data filed at 07/07/2018 0600 Gross per 24 hour  Intake 3467.39 ml  Output 2250 ml  Net 1217.39 ml   Filed Weights   07/05/18 0500 07/06/18 0500 07/07/18 0456  Weight: 61.9 kg 62.1 kg 66.7 kg    Examination: General: Cachectic male who is heavily sedated. HEENT: Tracheal gastric tube in place Neuro: Sedated.  He was equal reactive to light  CV: s1s2 rrr, no m/r/g PULM: even/non-labored, lungs bilaterally diminished in bases WU:JWJXGI:soft, non-tender, bsx4 present Extremities: warm/dry, negative edema  Skin: no rashes or lesions   Chest x-ray 5/8 personally reviewed mild left basilar atelectasis, no new infiltrates  Resolved Hospital Problem list    Assessment & Plan:   Acute encephalopathy due  to status epilepticus Related to alcohol withdrawal  - THC positive  Hx ETOH abuse P:  Neurology is following Currently heavy sedation with Versed per neurology Upper Vimpat and valproic acid per neurology Seizure precaution Daily thiamine and folate acid   Acute respiratory failure RLL infiltrate/ probable aspiration  P:  High-dose sedation precludes any attempt at weaning. VAP bundle   New fever 5/8 RLL infiltrate / ?aspiration - UA ok P:  Empiric unasyn x 5ds , stop 5/10 Trend WBC /  fever curve   AKI -resolved Hematuria-Foley DC'd 5/5 P:  Continue to follow bladder scans.  Noted to be leaking around catheter.  HTN HLD Hypotensive on propofol, currently on Versed 90 mg an hour with adequate blood pressure - neg trop/ normal EKG P:  Holding home antihypertensives Continue lipitor  CAD s/p PCI, LV thrombus on coumadin,  PVD, ICM (EF 35-40% 2014) - INR 1.3 Echo this admission does not show LV thrombus P:  Continue Brilinta No need for further anticoagulation at this time as he does not have an LV thrombus as reported from previous admissions.  Hyperglycemia CBG (last 3)  Recent Labs    07/06/18 2314 07/07/18 0310 07/07/18 0721  GLUCAP 112* 99 90    P:  Plan skills insulin protocol sensitive every 4 hours.   Depression P:  Continue to hold home Zoloft in the setting of Versed 90 mg an hour  Best practice:  Diet: NPO Pain/Anxiety/Delirium protocol (if indicated): RA SS goal based on seizure, currently -5, Celexa if needed VAP protocol (if indicated): yes DVT prophylaxis: SCDs, resume subcu heparin when able GI prophylaxis: PPI Glucose control: SSI sensitive Mobility: BR Code Status: Full  Family Communication: Daughter was contacted on 07/06/2018. Disposition: ICU  App cct 30 min  Brett Canales Minor ACNP [facility] PCCM Pager [phone] till 1 pm If no answer page [phone] 07/07/2018, 7:25 AM

## 2018-07-07 NOTE — Progress Notes (Signed)
EEG Maintenance done:  Continue running continuous EEG

## 2018-07-07 NOTE — Progress Notes (Signed)
MD to bedside. Order to bolus 10mg  versed and increase versed from 110mg  to 130mg /hr.

## 2018-07-07 NOTE — Procedures (Signed)
Electroencephalogram report- LTM with VIDEO  Ordering Physician : Dr Jodi Mourningeynold  Beginning time:  07/06/18 at 0730 Ending time:  07/07/18 at 0730 CPT/type : 95720  Day of study: day 5   Technical Description: The EEG was performed using standard setting per the guidelines of American Clinical Neurophys[employer] Society (ACNS).    A minimum of 21 electrodes were placed on scalp according to the International 10-20 or/and 10-10 Systems. Supplemental electrodes were placed as needed. Single EKG electrode was also used to detect cardiac arrhythmia. Patient's behavior was continuously recorded on video simultaneously with EEG. A minimum of 16 channels were used for data display. Each epoch of study was reviewed manually daily and as needed using standard referential and bipolar montages. Computerized quantitative EEG analysis (such as compressed spectral array analysis, trending, automated spike & seizure detection) were used as indicated.    Spike detection: ON  Seizure detection: ON   This  continuous  EEG monitoring with simultaneous video monitoring was performed for this patient with convulsions to rule out clinical and subclinical geographic seizures  medications: As per EMR  Day 1 There was no pushbutton activations events during this recording Background activities were marked by continuous reactive background activities predominantly in the theta range with superimposed faster frequencies.  Stage changes were present.  Superimposed left hemispheric sharp waves and spikes were present maximum negativity in a left frontocentral and central parietal cortex at times with negative field extending to the left frontal polar as well as right posterior paracentral cortex. Occasionally runs of poorly formed left periodic lateralized epileptiform discharges also present  Multiple electrographic seizures were present arising from left paracentral cortex remain confined to the left hemisphere.  No obvious  clinical accompaniment.  Day 2 frequent electrographic seizures present during first half of the recording.  Frequency every 5 minutes gradually improving to the every 3 to 4/h.  Last well-formed electrographic seizure around 5 PM.  Since that time background activities marked by a left hemispheric periodic epileptiform discharges left central parietal paracentral spike and polyspike and wave discharges.   Electrographic seizures marked by buildup of rhythmic synchronized sharp waves in the left central parietal cortex with gradual recruitment of adjacent left central parietal frontocentral and anterior frontal cortex remain confined to the left hemisphere.  Occasionally electrographic seizures were accompanied by right arm jerking and right leg jerking but not always. Left hemispheric periodic epileptiform discharges were present with frequency every 1 to 2 seconds in form of spike and wave and polyspike and wave discharges.  Maximum negativity left central parietal and frontocentral cortex.  Day 3: No clinical or subclinical seizures present throughout the recording.  Persistent left hemispheric periodic lateralized epileptiform discharges present with maximum negativity in the left posterior quadrant.  Day 4: No clinical or subclinical seizures present throughout the recording.  Background activities marked by continuous burst and attenuation pattern with occasional left central parietal sharp waves.  Left periodic lateralized epileptiform discharges were not present.  Day 5: No clinical subclinical seizures present throughout the recording.  Background activities marked by largely continuous background activities with with times burst and attenuation pattern.  Poorly formed left frontocentral and central parietal sharp waves still present suggestive of cortical irritability in that region.  Clinical interpretation: This day 5 of continuous EEG monitoring with simultaneous video monitoring did not  demonstrate any clinical or subclinical seizures.  Left frontocentral central parietal poorly formed sharply still present suggestive of some degree of cortical irritability in that region.  Continuous  monitoring is recommended to ensure stability and resolution of seizures.  Clinical correlation is advised.

## 2018-07-08 ENCOUNTER — Inpatient Hospital Stay: Payer: Self-pay

## 2018-07-08 ENCOUNTER — Inpatient Hospital Stay (HOSPITAL_COMMUNITY): Payer: Medicare Other

## 2018-07-08 LAB — CBC WITH DIFFERENTIAL/PLATELET
Abs Immature Granulocytes: 0.18 10*3/uL — ABNORMAL HIGH (ref 0.00–0.07)
Basophils Absolute: 0 10*3/uL (ref 0.0–0.1)
Basophils Relative: 0 %
Eosinophils Absolute: 0.1 10*3/uL (ref 0.0–0.5)
Eosinophils Relative: 0 %
HCT: 40.1 % (ref 39.0–52.0)
Hemoglobin: 13.1 g/dL (ref 13.0–17.0)
Immature Granulocytes: 1 %
Lymphocytes Relative: 6 %
Lymphs Abs: 1 10*3/uL (ref 0.7–4.0)
MCH: 30.2 pg (ref 26.0–34.0)
MCHC: 32.7 g/dL (ref 30.0–36.0)
MCV: 92.4 fL (ref 80.0–100.0)
Monocytes Absolute: 2.9 10*3/uL — ABNORMAL HIGH (ref 0.1–1.0)
Monocytes Relative: 18 %
Neutro Abs: 12.1 10*3/uL — ABNORMAL HIGH (ref 1.7–7.7)
Neutrophils Relative %: 75 %
Platelets: 122 10*3/uL — ABNORMAL LOW (ref 150–400)
RBC: 4.34 MIL/uL (ref 4.22–5.81)
RDW: 14.6 % (ref 11.5–15.5)
WBC: 16.3 10*3/uL — ABNORMAL HIGH (ref 4.0–10.5)
nRBC: 0 % (ref 0.0–0.2)

## 2018-07-08 LAB — GLUCOSE, CAPILLARY
Glucose-Capillary: 88 mg/dL (ref 70–99)
Glucose-Capillary: 98 mg/dL (ref 70–99)
Glucose-Capillary: 98 mg/dL (ref 70–99)
Glucose-Capillary: 114 mg/dL — ABNORMAL HIGH (ref 70–99)
Glucose-Capillary: 111 mg/dL — ABNORMAL HIGH (ref 70–99)

## 2018-07-08 LAB — MAGNESIUM: Magnesium: 1.9 mg/dL (ref 1.7–2.4)

## 2018-07-08 LAB — VALPROIC ACID LEVEL: Valproic Acid Lvl: 48 ug/mL — ABNORMAL LOW (ref 50.0–100.0)

## 2018-07-08 LAB — BASIC METABOLIC PANEL
Anion gap: 10 (ref 5–15)
BUN: 10 mg/dL (ref 8–23)
CO2: 21 mmol/L — ABNORMAL LOW (ref 22–32)
Calcium: 7.9 mg/dL — ABNORMAL LOW (ref 8.9–10.3)
Chloride: 109 mmol/L (ref 98–111)
Creatinine, Ser: 0.88 mg/dL (ref 0.61–1.24)
GFR calc Af Amer: >60 mL/min (ref >60)
GFR calc non Af Amer: >60 mL/min (ref >60)
Glucose, Bld: 89 mg/dL (ref 70–99)
Potassium: 3.9 mmol/L (ref 3.5–5.1)
Sodium: 140 mmol/L (ref 135–145)

## 2018-07-08 LAB — TRIGLYCERIDES: Triglycerides: 125 mg/dL (ref <150)

## 2018-07-08 LAB — PHOSPHORUS: Phosphorus: 3.3 mg/dL (ref 2.5–4.6)

## 2018-07-08 MED ORDER — PROPOFOL 1000 MG/100ML IV EMUL
40.0000 ug/kg/min | INTRAVENOUS | Status: DC
  Administered 2018-07-08: 23:00:00 20 ug/kg/min via INTRAVENOUS
  Administered 2018-07-08: 10 ug/kg/min via INTRAVENOUS
  Administered 2018-07-09 (×3): 60 ug/kg/min via INTRAVENOUS
  Administered 2018-07-09: 11:00:00 50 ug/kg/min via INTRAVENOUS
  Administered 2018-07-10: 18:00:00 45 ug/kg/min via INTRAVENOUS
  Administered 2018-07-10 (×3): 60 ug/kg/min via INTRAVENOUS
  Administered 2018-07-11: 06:00:00 40 ug/kg/min via INTRAVENOUS
  Administered 2018-07-11: 01:00:00 30 ug/kg/min via INTRAVENOUS
  Filled 2018-07-08 (×12): qty 100

## 2018-07-08 MED ORDER — VALPROATE SODIUM 500 MG/5ML IV SOLN
300.0000 mg | INTRAVENOUS | Status: DC
  Administered 2018-07-08 – 2018-07-11 (×18): 300 mg via INTRAVENOUS
  Filled 2018-07-08 (×24): qty 3

## 2018-07-08 MED ORDER — PROPOFOL 1000 MG/100ML IV EMUL
INTRAVENOUS | Status: AC
  Filled 2018-07-08: qty 100

## 2018-07-08 NOTE — Procedures (Signed)
Electroencephalogram report- LTM with VIDEO  Ordering Physician : Dr Jodi Mourningeynold  Beginning time:  07/07/18 at 0730 Ending time:  07/08/18 at 0730 CPT/type : 95720  Day of study: day 6   Technical Description: The EEG was performed using standard setting per the guidelines of American Clinical Neurophysiology Society (ACNS).    A minimum of 21 electrodes were placed on scalp according to the International 10-20 or/and 10-10 Systems. Supplemental electrodes were placed as needed. Single EKG electrode was also used to detect cardiac arrhythmia. Patient's behavior was continuously recorded on video simultaneously with EEG. A minimum of 16 channels were used for data display. Each epoch of study was reviewed manually daily and as needed using standard referential and bipolar montages. Computerized quantitative EEG analysis (such as compressed spectral array analysis, trending, automated spike & seizure detection) were used as indicated.    Spike detection: ON  Seizure detection: ON   This  continuous  EEG monitoring with simultaneous video monitoring was performed for this patient with convulsions to rule out clinical and subclinical geographic seizures  medications: As per EMR  Day 1 There was no pushbutton activations events during this recording Background activities were marked by continuous reactive background activities predominantly in the theta range with superimposed faster frequencies.  Stage changes were present.  Superimposed left hemispheric sharp waves and spikes were present maximum negativity in a left frontocentral and central parietal cortex at times with negative field extending to the left frontal polar as well as right posterior paracentral cortex. Occasionally runs of poorly formed left periodic lateralized epileptiform discharges also present  Multiple electrographic seizures were present arising from left paracentral cortex remain confined to the left hemisphere.  No  obvious clinical accompaniment.  Day 2 frequent electrographic seizures present during first half of the recording.  Frequency every 5 minutes gradually improving to the every 3 to 4/h.  Last well-formed electrographic seizure around 5 PM.  Since that time background activities marked by a left hemispheric periodic epileptiform discharges left central parietal paracentral spike and polyspike and wave discharges.   Electrographic seizures marked by buildup of rhythmic synchronized sharp waves in the left central parietal cortex with gradual recruitment of adjacent left central parietal frontocentral and anterior frontal cortex remain confined to the left hemisphere.  Occasionally electrographic seizures were accompanied by right arm jerking and right leg jerking but not always. Left hemispheric periodic epileptiform discharges were present with frequency every 1 to 2 seconds in form of spike and wave and polyspike and wave discharges.  Maximum negativity left central parietal and frontocentral cortex.  Day 3: No clinical or subclinical seizures present throughout the recording.  Persistent left hemispheric periodic lateralized epileptiform discharges present with maximum negativity in the left posterior quadrant.  Day 4: No clinical or subclinical seizures present throughout the recording.  Background activities marked by continuous burst and attenuation pattern with occasional left central parietal sharp waves.  Left periodic lateralized epileptiform discharges were not present.  Day 5: No clinical subclinical seizures present throughout the recording.  Background activities marked by largely continuous background activities with with times burst and attenuation pattern.  Poorly formed left frontocentral and central parietal sharp waves still present suggestive of cortical irritability in that region.  Day 6 :  No clinical or  subclinical seizures present throughout the recording.  Background activities  marked by largely continuous background activities with reactivity and state changes.  Poorly formed left frontocentral and central parietal sharp waves still present suggestive of cortical  irritability in that region.   Clinical interpretation: This day 6 of continuous EEG monitoring with simultaneous video monitoring did not demonstrate any clinical or subclinical seizures.  Left frontocentral central parietal poorly formed sharply still present suggestive of some degree of cortical irritability in that region.  Continuous monitoring is recommended to ensure stability and resolution of seizures.  Clinical correlation is advised.

## 2018-07-08 NOTE — Progress Notes (Signed)
NAME:  [facility], MRN:  [id number], DOB:  1955/05/26, LOS: 6 ADMISSION DATE:  Jul 14, 2018, CONSULTATION DATE:  2018/07/14 REFERRING MD:  [facility], CHIEF COMPLAINT:  seizures  Brief History   63 yoM presenting with witnessed seizures x 3, did not return to baseline and required intubation for airway protection.  [facility] for continuous EEG.    History of present illness   HPI obtained from medical chart review as patient is intubated on mechanical ventilation.   63 year old male with history of CAD s/p PCI, LV thrombus on coumadin,  PVD, GERD, HTN, HLD, ICM, seizures, tobacco and ETOH abuse who presented from his boarding house with EMS for reported seizure to [facility].  It appears patient is not on any home anticonvulsants.   Reportedly add focal seizure with EMS and noted to be hypertensive 224/124 given ativan. He remained postictal.  Shortly after arrival to ER, he had another seizure, given ativan.  Required NRB to maintain saturations.  CT head was negative for bleed or abnormality.  Loaded with keppra.  Labs noted for INR 1.3, glucose 213, WBC 12, UA neg, UDS positive for THC.  Had ongoing focal seizure activity and unresponsive requiring intubation. CXR with right base infiltrate.  He was transferred to [facility] with Neurology consulting for continuous EEG, PCCM to admit.   Past Medical History  CAD s/p PCI, LV thrombus on coumadin,  PVD, GERD, HTN, HLD, ICM, seizures, tobacco and ETOH abuse  Significant [facility] Events   5/4 Admit 5/5 Sedated on propofol , loaded with Vimpat, Keppra and valproate And Ativan standing dose every 4 hours 5/6 Developed hematuria, Foley blocked with clot and removed and heparin was stopped  5/7 versed gtt due to persistent status  Consults:  Neurology   Procedures:  5/4 ETT >>  Significant Diagnostic Tests:  5/4 [facility] >> chronic changes, no acute abnormality  5/5 EEG >>abnormal, intermittent left hemispheric sharp waves.   LTM EEG  5/5 >> multiple seizures arising from left paracentral frontocentral and central parietal cortex. 5/7 left PLEDs   Micro Data:  5/4 MRSA PCR  >>neg 5/5 respiratory >> neg  Antimicrobials:  5/5 unasyn >>  Interim history/subjective:  Remains heavily sedated with Versed 130mg  per hour     Objective   Blood pressure 109/68, pulse 83, temperature 98.4 F (36.9 C), resp. rate 16, height 5\' 11"  (1.803 m), weight 67.6 kg, SpO2 98 %.    Vent Mode: PRVC FiO2 (%):  [30 %] 30 % Set Rate:  [16 bmp] 16 bmp Vt Set:  [600 mL] 600 mL PEEP:  [5 cmH20] 5 cmH20 Plateau Pressure:  [16 cmH20-17 cmH20] 17 cmH20   Intake/Output Summary (Last 24 hours) at 07/08/2018 0916 Last data filed at 07/08/2018 0800 Gross per 24 hour  Intake 3461.72 ml  Output 1600 ml  Net 1861.72 ml   Filed Weights   07/06/18 0500 07/07/18 0456 07/08/18 0500  Weight: 62.1 kg 66.7 kg 67.6 kg    Examination: General: Cachectic male who is heavily sedated. HEENT: Tracheal gastric tube in place Neuro: Sedated.  He was equal reactive to light  CV: s1s2 rrr, no m/r/g PULM: even/non-labored, lungs bilaterally diminished in bases TG:YBWL, non-tender, bsx4 present Extremities: warm/dry, negative edema  Skin: no rashes or lesions   Chest x-ray 5/8 personally reviewed mild left basilar atelectasis, no new infiltrates  Resolved [facility] Problem list    Assessment & Plan:   Acute encephalopathy due to status epilepticus Related  to alcohol withdrawal  - THC positive  Hx ETOH abuse P:  Neurology is following Currently heavy sedation with Versed per neurology Upper Vimpat and valproic acid per neurology Seizure precaution Daily thiamine and folate acid   Acute respiratory failure RLL infiltrate/ probable aspiration  P:  High-dose sedation precludes any attempt at weaning; versed increased to 130 mg/hr for suppression, propofol added. VAP bundle   New fever 5/8 RLL infiltrate / ?aspiration - UA ok P:   Empiric unasyn x 5ds , stop 5/10 Trend WBC / fever curve   AKI -resolved Hematuria-Foley DC'd 5/5 P:  Continue to follow bladder scans.  Noted to be leaking around catheter.  HTN HLD Hypotensive on propofol, currently on Versed 90 mg an hour with adequate blood pressure - neg trop/ normal EKG P:  Holding home antihypertensives Continue lipitor  CAD s/p PCI, LV thrombus on coumadin,  PVD, ICM (EF 35-40% 2014) - INR 1.3 Echo this admission does not show LV thrombus P:  Continue Brilinta No need for further anticoagulation at this time as he does not have an LV thrombus as reported from previous admissions.  Hyperglycemia CBG (last 3)  Recent Labs    07/07/18 2320 07/08/18 0332 07/08/18 0719  GLUCAP 79 88 98    P:  Plan skills insulin protocol sensitive every 4 hours. Restart tube feeds today   Depression P:  Continue to hold home Zoloft in the setting of Versed 90 mg an hour  Best practice:  Diet: NPO Pain/Anxiety/Delirium protocol (if indicated): RA SS goal based on seizure, currently -5, Celexa if needed VAP protocol (if indicated): yes DVT prophylaxis: SCDs, resume subcu heparin when able GI prophylaxis: PPI Glucose control: SSI sensitive Mobility: BR Code Status: Full  Family Communication: Daughter was contacted on 07/06/2018. Disposition: ICU   I have independently seen and examined the patient, reviewed data, and developed an assessment and plan. A total of 32 minutes were spent in critical care assessment and medical decision making. This critical care time does not reflect procedure time, or teaching time or supervisory time of PA/NP/Med student/Med Resident, etc but could involve care discussion time.  Edward EdingerPaul C. Yaziel Brandon, MD PhD  07/08/2018, 9:16 AM

## 2018-07-08 NOTE — Progress Notes (Addendum)
Subjective: Sedated and intubated.   Objective: Current vital signs: BP 109/68 (BP Location: Right Arm)   Pulse 83   Temp 98.4 F (36.9 C)   Resp 16   Ht _0  (1.803 m)   Wt 67.6 kg   SpO2 98%   BMI 20.79 kg/m  Vital signs in last 24 hours: Temp:  [98.4 F (36.9 C)-99.1 F (37.3 C)] 98.4 F (36.9 C) (05/10 0800) Pulse Rate:  [83-100] 83 (05/10 0800) Resp:  [16-21] 16 (05/10 0800) BP: (97-165)/(60-92) 109/68 (05/10 0800) SpO2:  [91 %-100 %] 98 % (05/10 0800) FiO2 (%):  [30 %] 30 % (05/10 0800) Weight:  [67.6 kg] 67.6 kg (05/10 0500)  Intake/Output from previous day: 05/09 0701 - 05/10 0700 In: 3231.9 [I.V.:2581; IV Piggyback:650.9] Out: 1640 [Urine:1640] Intake/Output this shift: Total I/O In: 409.9 [I.V.:256.9; IV Piggyback:153] Out: 150 [Urine:150] Nutritional status:  Diet Order            Diet NPO time specified  Diet effective now              HEENT: [place]/AT Lungs: Intubated Ext: No edema. Decreased muscle bulk x 4  Neurologic Exam:Intubated and sedated on Versed at 130 mg/hr Ment:Eyes closed with no eye opening to any stimuli. No movement to any stimuli. Does not follow commands or attempt to communicate.Sedated on Versed at a rate of 110 mg/hr. CN: Pupils unreactive at 2 mm. No blink to threat. No doll's eye reflex. No blink to eyelash stimulation bilaterally. Face with flaccid tone; no grimace to noxious.  Motor/Sensory: Flaccid tone in all 4 extremities. No movement of upper extremities to light pinch. Weakly withdraws BLE to plantar stimulation, more briskly on the right. Reflexes: Pathologically brisk reflexes throughout, brisker on right than left.  Toes are upgoing bilaterally.   Lab Results: Results for orders placed or performed during the hospital encounter of 07/01/2018 (from the past 48 hour(s))  Glucose, capillary     Status: None   Collection Time: 07/06/18 11:04 AM  Result Value Ref Range   Glucose-Capillary 86 70 - 99 mg/dL   Glucose, capillary     Status: None   Collection Time: 07/06/18  3:24 PM  Result Value Ref Range   Glucose-Capillary 90 70 - 99 mg/dL  Glucose, capillary     Status: Abnormal   Collection Time: 07/06/18  7:38 PM  Result Value Ref Range   Glucose-Capillary 113 (H) 70 - 99 mg/dL  Glucose, capillary     Status: Abnormal   Collection Time: 07/06/18 11:14 PM  Result Value Ref Range   Glucose-Capillary 112 (H) 70 - 99 mg/dL  Glucose, capillary     Status: None   Collection Time: 07/07/18  3:10 AM  Result Value Ref Range   Glucose-Capillary 99 70 - 99 mg/dL  CBC     Status: Abnormal   Collection Time: 07/07/18  3:55 AM  Result Value Ref Range   WBC 16.2 (H) 4.0 - 10.5 K/uL   RBC 4.52 4.22 - 5.81 MIL/uL   Hemoglobin 13.5 13.0 - 17.0 g/dL   HCT 42.5 39.0 - 52.0 %   MCV 94.0 80.0 - 100.0 fL   MCH 29.9 26.0 - 34.0 pg   MCHC 31.8 30.0 - 36.0 g/dL   RDW 14.2 11.5 - 15.5 %   Platelets 107 (L) 150 - 400 K/uL    Comment: Immature Platelet Fraction may be clinically indicated, consider ordering this additional test [id number] CONSISTENT WITH PREVIOUS RESULT    nRBC  0.0 0.0 - 0.2 %    Comment: Performed at [facility], [place] [address] [place], [place] [address]  Basic metabolic panel     Status: Abnormal   Collection Time: 07/07/18  3:55 AM  Result Value Ref Range   Sodium 139 135 - 145 mmol/L   Potassium 4.4 3.5 - 5.1 mmol/L   Chloride 105 98 - 111 mmol/L   CO2 20 (L) 22 - 32 mmol/L   Glucose, Bld 80 70 - 99 mg/dL   BUN 8 8 - 23 mg/dL   Creatinine, Ser 0.87 0.61 - 1.24 mg/dL   Calcium 8.0 (L) 8.9 - 10.3 mg/dL   GFR calc non Af Amer >60 >60 mL/min   GFR calc Af Amer >60 >60 mL/min   Anion gap 14 5 - 15    Comment: Performed at [facility], [place] [address], [place], [place] [address]  Glucose, capillary     Status: None   Collection Time: 07/07/18  7:21 AM  Result Value Ref Range   Glucose-Capillary 90 70 - 99 mg/dL   Comment 1 Notify RN    Comment 2 Document in  Chart   Glucose, capillary     Status: None   Collection Time: 07/07/18 11:38 AM  Result Value Ref Range   Glucose-Capillary 81 70 - 99 mg/dL   Comment 1 Notify RN    Comment 2 Document in Chart   Glucose, capillary     Status: None   Collection Time: 07/07/18  3:38 PM  Result Value Ref Range   Glucose-Capillary 92 70 - 99 mg/dL   Comment 1 Notify RN    Comment 2 Document in Chart   Glucose, capillary     Status: None   Collection Time: 07/07/18  7:25 PM  Result Value Ref Range   Glucose-Capillary 87 70 - 99 mg/dL  Glucose, capillary     Status: None   Collection Time: 07/07/18 11:20 PM  Result Value Ref Range   Glucose-Capillary 79 70 - 99 mg/dL  CBC with Differential/Platelet     Status: Abnormal   Collection Time: 07/08/18  3:29 AM  Result Value Ref Range   WBC 16.3 (H) 4.0 - 10.5 K/uL   RBC 4.34 4.22 - 5.81 MIL/uL   Hemoglobin 13.1 13.0 - 17.0 g/dL   HCT 40.1 39.0 - 52.0 %   MCV 92.4 80.0 - 100.0 fL   MCH 30.2 26.0 - 34.0 pg   MCHC 32.7 30.0 - 36.0 g/dL   RDW 14.6 11.5 - 15.5 %   Platelets 122 (L) 150 - 400 K/uL   nRBC 0.0 0.0 - 0.2 %   Neutrophils Relative % 75 %   Neutro Abs 12.1 (H) 1.7 - 7.7 K/uL   Lymphocytes Relative 6 %   Lymphs Abs 1.0 0.7 - 4.0 K/uL   Monocytes Relative 18 %   Monocytes Absolute 2.9 (H) 0.1 - 1.0 K/uL   Eosinophils Relative 0 %   Eosinophils Absolute 0.1 0.0 - 0.5 K/uL   Basophils Relative 0 %   Basophils Absolute 0.0 0.0 - 0.1 K/uL   Immature Granulocytes 1 %   Abs Immature Granulocytes 0.18 (H) 0.00 - 0.07 K/uL    Comment: Performed at [facility], [address] [place], [place] [address]  Basic metabolic panel     Status: Abnormal   Collection Time: 07/08/18  3:29 AM  Result Value Ref Range   Sodium 140 135 - 145 mmol/L  Potassium 3.9 3.5 - 5.1 mmol/L   Chloride 109 98 - 111 mmol/L   CO2 21 (L) 22 - 32 mmol/L   Glucose, Bld 89 70 - 99 mg/dL   BUN 10 8 - 23 mg/dL   Creatinine, Ser 0.88 0.61 - 1.24 mg/dL   Calcium 7.9  (L) 8.9 - 10.3 mg/dL   GFR calc non Af Amer >60 >60 mL/min   GFR calc Af Amer >60 >60 mL/min   Anion gap 10 5 - 15    Comment: Performed at [facility] [address], [place], [place] [address]  Magnesium     Status: None   Collection Time: 07/08/18  3:29 AM  Result Value Ref Range   Magnesium 1.9 1.7 - 2.4 mg/dL    Comment: Performed at [facility] [address] [place], [place] [address]  Phosphorus     Status: None   Collection Time: 07/08/18  3:29 AM  Result Value Ref Range   Phosphorus 3.3 2.5 - 4.6 mg/dL    Comment: Performed at [facility] [address] [place], [place] [address]  Valproic acid level     Status: Abnormal   Collection Time: 07/08/18  3:29 AM  Result Value Ref Range   Valproic Acid Lvl 48 (L) 50.0 - 100.0 ug/mL    Comment: Performed at [facility] [address] [place], [place] [address]  Glucose, capillary     Status: None   Collection Time: 07/08/18  3:32 AM  Result Value Ref Range   Glucose-Capillary 88 70 - 99 mg/dL  Glucose, capillary     Status: None   Collection Time: 07/08/18  7:19 AM  Result Value Ref Range   Glucose-Capillary 98 70 - 99 mg/dL   Comment 1 Notify RN    Comment 2 Document in Chart     Recent Results (from the past 240 hour(s))  SARS Coronavirus 2 (CEPHEID- Performed in [facility] hospital lab), Hosp Order     Status: None   Collection Time: 07/22/2018  7:26 PM  Result Value Ref Range Status   SARS Coronavirus 2 NEGATIVE NEGATIVE Final    Comment: (NOTE) If result is NEGATIVE SARS-CoV-2 target nucleic acids are NOT DETECTED. The SARS-CoV-2 RNA is generally detectable in upper and lower  respiratory specimens during the acute phase of infection. The lowest  concentration of SARS-CoV-2 viral copies this assay can detect is 250  copies / mL. A negative result does not preclude SARS-CoV-2 infection  and should not be used as the sole basis for treatment or other  patient management decisions.   A negative result may occur with  improper specimen collection / handling, submission of specimen other  than nasopharyngeal swab, presence of viral mutation(s) within the  areas targeted by this assay, and inadequate number of viral copies  (<250 copies / mL). A negative result must be combined with clinical  observations, patient history, and epidemiological information. If result is POSITIVE SARS-CoV-2 target nucleic acids are DETECTED. The SARS-CoV-2 RNA is generally detectable in upper and lower  respiratory specimens dur ing the acute phase of infection.  Positive  results are indicative of active infection with SARS-CoV-2.  Clinical  correlation with patient history and other diagnostic information is  necessary to determine patient infection status.  Positive results do  not rule out bacterial infection or co-infection with other viruses. If result is PRESUMPTIVE POSTIVE SARS-CoV-2 nucleic acids MAY BE PRESENT.   A  presumptive positive result was obtained on the submitted specimen  and confirmed on repeat testing.  While 2019 novel coronavirus  (SARS-CoV-2) nucleic acids may be present in the submitted sample  additional confirmatory testing may be necessary for epidemiological  and / or clinical management purposes  to differentiate between  SARS-CoV-2 and other Sarbecovirus currently known to infect humans.  If clinically indicated additional testing with an alternate test  methodology [phone]) is advised. The SARS-CoV-2 RNA is generally  detectable in upper and lower respiratory sp ecimens during the acute  phase of infection. The expected result is Negative. Fact Sheet for Patients:  StrictlyIdeas.no Fact Sheet for Healthcare Providers: BankingDealers.co.za This test is not yet approved or cleared by the [place] FDA and has been authorized for detection and/or diagnosis of SARS-CoV-2 by FDA under an Emergency Use  Authorization (EUA).  This EUA will remain in effect (meaning this test can be used) for the duration of the COVID-19 declaration under Section 564(b)(1) of the Act, 21 U.S.C. section 360bbb-3(b)(1), unless the authorization is terminated or revoked sooner. Performed at [facility], [address] [place], [place] [address]   MRSA PCR Screening     Status: None   Collection Time: 07/09/2018 11:38 PM  Result Value Ref Range Status   MRSA by PCR NEGATIVE NEGATIVE Final    Comment:        The GeneXpert MRSA Assay (FDA approved for NASAL specimens only), is one component of a comprehensive MRSA colonization surveillance program. It is not intended to diagnose MRSA infection nor to guide or monitor treatment for MRSA infections. Performed at [facility], [place] [address] [place], [place] [address]   Culture, respiratory (non-expectorated)     Status: None   Collection Time: 07/03/18  8:36 AM  Result Value Ref Range Status   Specimen Description TRACHEAL ASPIRATE  Final   Special Requests NONE  Final   Gram Stain   Final    ABUNDANT WBC PRESENT, PREDOMINANTLY PMN MODERATE GRAM NEGATIVE RODS MODERATE GRAM POSITIVE COCCI    Culture   Final    FEW Consistent with normal respiratory flora. Performed at [facility], [place] [address], [place], [place] [address]    Report Status 07/05/2018 FINAL  Final    Lipid Panel No results for input(s): CHOL, TRIG, HDL, CHOLHDL, VLDL, LDLCALC in the last 72 hours.  Studies/Results: Dg Chest Port 1 View  Result Date: 07/08/2018 CLINICAL DATA:  Respiratory failure. EXAM: PORTABLE CHEST 1 VIEW COMPARISON:  07/06/2018 FINDINGS: The patient is rotated to the right. An endotracheal tube terminates 4 cm above the carina. Enteric tube courses into the abdomen with tip not imaged. The cardiomediastinal silhouette is unchanged with normal heart size. The lungs remain hypoinflated with stable to slight worsening of mild left basilar  opacity. There is also minimal opacity in the right lung base. No sizable pleural effusion or pneumothorax is identified. IMPRESSION: Mild bibasilar opacities, mildly increased and likely reflecting atelectasis. Electronically Signed   By: Logan Bores M.D.   On: 07/08/2018 07:15    Medications:  Scheduled: . atorvastatin  80 mg Per Tube q1800  . chlorhexidine gluconate (MEDLINE KIT)  15 mL Mouth Rinse BID  . feeding supplement (PRO-STAT SUGAR FREE 64)  30 mL Per Tube Daily  . folic acid  1 mg Intravenous Daily  . insulin aspart  0-9 Units Subcutaneous Q4H  . mouth rinse  15 mL Mouth Rinse 10 times per day  .  pantoprazole sodium  40 mg Per Tube Q1200  . thiamine  100 mg Intravenous Daily  . ticagrelor  90 mg Per Tube BID   Continuous: . sodium chloride    . sodium chloride Stopped (07/04/18 1044)  . ampicillin-sulbactam (UNASYN) IV Stopped (07/08/18 0525)  . feeding supplement (OSMOLITE 1.5 CAL) Stopped (07/06/18 1600)  . lacosamide (VIMPAT) IV 200 mg (07/08/18 1937)  . levETIRAcetam Stopped (07/08/18 9024)  . midazolam (VERSED) infusion 130 mg/hr (07/08/18 0800)  . norepinephrine (LEVOPHED) Adult infusion Stopped (07/06/18 1546)  . valproate sodium Stopped (07/08/18 0740)    EEG report for this morning (5/10): This day 6 of continuous EEG monitoring with simultaneous video monitoring did not demonstrate any clinical or subclinical seizures.  Left frontocentral central parietal poorly formed sharply still present suggestive of some degree of cortical irritability in that region.     Assessment:52 year oldwith a history of EtOH abuse and undetectable alcohol level on initial evaluation at [facility] who wasbrought in from a group home after having seizure activity.Had witnessed tonic-clonic episode in the emergency room at [facility], for which he was given benzodiazepines and loaded with Keppra.He had a total of 3 seizures without return to baseline, consistent with  status epilepticus.Mentation did not returnto baseline requiring to be intubationfor airway protection. [facility]. 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 Now no longer with electrographic status, but requires upward titration of IV GABA-ergic sedatives to eliminate epileptogenic discharges from irritative cortical foci in left frontocentral and central parietal regions on EEG, towards the goal of non-recurrence of seizures following weaning off sedatives.  1.Preliminary review of LTM EEG at the bedside reveals diffuse slowing with left > right amplitudes. No electrographic seizures are appreciated. LTM EEG tracings reviewed while patient on midazolam gtt at 130 mg/hr.   2. He has a documented history of seizures on his chart but no antiepileptics listedas an outpatient in Epic.He was loaded with Keppra in the outside ER and given benzodiazepines to break the seizure.He is now on Keppraat1000 mg BID,valproic acid at5 mg/kgIV TIDand Vimpatat 200 mg IV BID.Valproic acid level from Wednesday was therapeutic at 23. 3.Other considerationsfor his presentationincluded stroke as he has a history of LV thrombus and wason Coumadinas a listed home medication. This is felt to be unlikely as the etiology for his presentation but will obtain MRI when EEG is discontinued.  4. Additional problem list includes leukocytosis and RLL infiltrate thought possibly to be due to aspiration. 5. CT head revealed chronic changes without acute abnormality  Recommendations: 1.Continuing LTM EEG 2. Continue midazolam gtt at 130 mg/hr. Starting propofol at 10 mcg/kg/min  3. After 24-48 hours of flatline EEG or burst suppression absent epileptiform discharges is achieved, will taper off Versed and propofol while on LTM EEG 4. Afterseizures are stabilized  andEEG is discontinued, will need to obtain MRI brain. 5. Unasyn per ICU team for possible infection. 6.Heparin infusionwas stopped due to bleeding around foley and straight blood from condom cath. He was on thisfor LV thrombus.Still on Brilinta.  7.Evaluate for strokewith MRIafter EEG leads are removed, given the history of LV thrombus. 8. Continue thiamine.   35 minutes spent in the neurological evaluation and management of this critically ill patient. Time spent included EEG review.    LOS: 6 days   _0  signed: Dr. Kerney Elbe 07/08/2018  9:19 AM

## 2018-07-09 LAB — CBC
HCT: 39.9 % (ref 39.0–52.0)
Hemoglobin: 12.9 g/dL — ABNORMAL LOW (ref 13.0–17.0)
MCH: 30.4 pg (ref 26.0–34.0)
MCHC: 32.3 g/dL (ref 30.0–36.0)
MCV: 93.9 fL (ref 80.0–100.0)
Platelets: 187 10*3/uL (ref 150–400)
RBC: 4.25 MIL/uL (ref 4.22–5.81)
RDW: 15.3 % (ref 11.5–15.5)
WBC: 12.5 10*3/uL — ABNORMAL HIGH (ref 4.0–10.5)
nRBC: 0 % (ref 0.0–0.2)

## 2018-07-09 LAB — VALPROIC ACID LEVEL: Valproic Acid Lvl: 63 ug/mL (ref 50.0–100.0)

## 2018-07-09 LAB — BLOOD GAS, ARTERIAL
Acid-base deficit: 0.1 mmol/L (ref 0.0–2.0)
Bicarbonate: 23.7 mmol/L (ref 20.0–28.0)
Drawn by: 275531
FIO2: 0.4
MECHVT: 600 mL
O2 Saturation: 93.6 %
PEEP: 5 cmH2O
Patient temperature: 98.6
RATE: 16 resp/min
pCO2 arterial: 36.4 mmHg (ref 32.0–48.0)
pH, Arterial: 7.429 (ref 7.350–7.450)
pO2, Arterial: 69.5 mmHg — ABNORMAL LOW (ref 83.0–108.0)

## 2018-07-09 LAB — GLUCOSE, CAPILLARY
Glucose-Capillary: 100 mg/dL — ABNORMAL HIGH (ref 70–99)
Glucose-Capillary: 100 mg/dL — ABNORMAL HIGH (ref 70–99)
Glucose-Capillary: 105 mg/dL — ABNORMAL HIGH (ref 70–99)
Glucose-Capillary: 120 mg/dL — ABNORMAL HIGH (ref 70–99)
Glucose-Capillary: 120 mg/dL — ABNORMAL HIGH (ref 70–99)
Glucose-Capillary: 121 mg/dL — ABNORMAL HIGH (ref 70–99)
Glucose-Capillary: 99 mg/dL (ref 70–99)

## 2018-07-09 MED ORDER — FOLIC ACID 1 MG PO TABS: 1.0000 mg | ORAL_TABLET | Freq: Every day | ORAL | Status: DC

## 2018-07-09 MED ORDER — ENOXAPARIN SODIUM 40 MG/0.4ML ~~LOC~~ SOLN
40.0000 mg | SUBCUTANEOUS | Status: AC
  Administered 2018-07-09 – 2018-07-22 (×13): 40 mg via SUBCUTANEOUS
  Filled 2018-07-09 (×15): qty 0.4

## 2018-07-09 MED ORDER — FOLIC ACID 1 MG PO TABS
1.0000 mg | ORAL_TABLET | Freq: Every day | ORAL | Status: DC
  Administered 2018-07-10 – 2018-07-24 (×15): 1 mg
  Filled 2018-07-09 (×15): qty 1

## 2018-07-09 MED ORDER — VITAMIN B-1 100 MG PO TABS
100.0000 mg | ORAL_TABLET | Freq: Every day | ORAL | Status: DC
  Administered 2018-07-10 – 2018-07-24 (×15): 100 mg
  Filled 2018-07-09 (×15): qty 1

## 2018-07-09 MED ORDER — VITAMIN B-1 100 MG PO TABS: 100.0000 mg | ORAL_TABLET | Freq: Every day | ORAL | Status: DC

## 2018-07-09 NOTE — Progress Notes (Signed)
Pharmacy Antibiotic Note  Edward Cooper is a 63 y.o. male admitted on 07/22/2018 with seizures requiring intubation and now concern for aspiration PNA.  Pharmacy has been consulted for unasyn dosing.  Per discussion with CCM 5/10, will plan for 7 days of antibiotics. Will complete today 5/11. Renal function stable, WBC 16.3>>12.5, afebrile.  Plan: Unasyn 3g IV every 6 hours until 5/11 Monitor renal function, clinical progression and LOT  Height: 5\' 11"  (180.3 cm) Weight: 148 lb 9.4 oz (67.4 kg) IBW/kg (Calculated) : 75.3  Temp (24hrs), Avg:98.4 F (36.9 C), Min:97.7 F (36.5 C), Max:99 F (37.2 C)  Recent Labs  Lab 07/04/18 0401 07/05/18 0404 07/06/18 0306 07/07/18 0355 07/08/18 0329 07/09/18 0445  WBC 12.9* 11.3* 8.9 16.2* 16.3* 12.5*  CREATININE 1.05 0.94 0.83 0.87 0.88  --     Estimated Creatinine Clearance: 84 mL/min (by C-G formula based on SCr of 0.88 mg/dL).    No Known Allergies  Antimicrobials this admission: Unasyn 5/5>>5/11  Microbiology results: MRSA PCR negative  Wendelyn Breslow, PharmD PGY1 Pharmacy Resident Phone: [phone] 07/09/2018 7:35 AM

## 2018-07-09 NOTE — Procedures (Signed)
Electroencephalogram report- LTM with VIDEO  Ordering Physician : Dr Jodi Mourning  Beginning time:  07/08/18 at 0730 Ending time:  07/09/18 at 0730 CPT/type : 95720  Day of study: day 7   Technical Description: The EEG was performed using standard setting per the guidelines of American Clinical Neurophysiology Society (ACNS).    A minimum of 21 electrodes were placed on scalp according to the International 10-20 or/and 10-10 Systems. Supplemental electrodes were placed as needed. Single EKG electrode was also used to detect cardiac arrhythmia. Patient's behavior was continuously recorded on video simultaneously with EEG. A minimum of 16 channels were used for data display. Each epoch of study was reviewed manually daily and as needed using standard referential and bipolar montages. Computerized quantitative EEG analysis (such as compressed spectral array analysis, trending, automated spike & seizure detection) were used as indicated.    Spike detection: ON  Seizure detection: ON   This  continuous  EEG monitoring with simultaneous video monitoring was performed for this patient with convulsions to rule out clinical and subclinical geographic seizures  medications: As per EMR  Day 1 There was no pushbutton activations events during this recording Background activities were marked by continuous reactive background activities predominantly in the theta range with superimposed faster frequencies.  Stage changes were present.  Superimposed left hemispheric sharp waves and spikes were present maximum negativity in a left frontocentral and central parietal cortex at times with negative field extending to the left frontal polar as well as right posterior paracentral cortex. Occasionally runs of poorly formed left periodic lateralized epileptiform discharges also present  Multiple electrographic seizures were present arising from left paracentral cortex remain confined to the left hemisphere.  No  obvious clinical accompaniment.  Day 2 frequent electrographic seizures present during first half of the recording.  Frequency every 5 minutes gradually improving to the every 3 to 4/h.  Last well-formed electrographic seizure around 5 PM.  Since that time background activities marked by a left hemispheric periodic epileptiform discharges left central parietal paracentral spike and polyspike and wave discharges.   Electrographic seizures marked by buildup of rhythmic synchronized sharp waves in the left central parietal cortex with gradual recruitment of adjacent left central parietal frontocentral and anterior frontal cortex remain confined to the left hemisphere.  Occasionally electrographic seizures were accompanied by right arm jerking and right leg jerking but not always. Left hemispheric periodic epileptiform discharges were present with frequency every 1 to 2 seconds in form of spike and wave and polyspike and wave discharges.  Maximum negativity left central parietal and frontocentral cortex.  Day 3: No clinical or subclinical seizures present throughout the recording.  Persistent left hemispheric periodic lateralized epileptiform discharges present with maximum negativity in the left posterior quadrant.  Day 4: No clinical or subclinical seizures present throughout the recording.  Background activities marked by continuous burst and attenuation pattern with occasional left central parietal sharp waves.  Left periodic lateralized epileptiform discharges were not present.  Day 5: No clinical subclinical seizures present throughout the recording.  Background activities marked by largely continuous background activities with with times burst and attenuation pattern.  Poorly formed left frontocentral and central parietal sharp waves still present suggestive of cortical irritability in that region.  Day 6 :  No clinical or  subclinical seizures present throughout the recording.  Background activities  marked by largely continuous background activities with reactivity and state changes.  Poorly formed left frontocentral and central parietal sharp waves still present suggestive of cortical  irritability in that region.  Day 7: No clinical or subclinical seizures present throughout the recording.  Background activities marked by continuous background activities with stage changes.  Occasionally left frontocentral sharp waves and spikes present suggestive of some degree of cortical irritability in that region.   Clinical interpretation: This day 7 of continuous EEG monitoring with simultaneous video monitoring did not demonstrate any clinical or subclinical seizures.  Left frontocentral central parietal poorly formed sharply still present suggestive of some degree of cortical irritability in that region.  Clinical correlation is advised.

## 2018-07-09 NOTE — Progress Notes (Signed)
NAME:  Edward Cooper, MRN:  [id number], DOB:  12/28/1955, LOS: 7 ADMISSION DATE:  2018-07-15, CONSULTATION DATE:  07-15-18 REFERRING MD:  [facility], CHIEF COMPLAINT:  seizures  Brief History   62 yoM presenting with witnessed seizures x 3, did not return to baseline and required intubation for airway protection.  Transferred from [facility] to [facility] for continuous EEG.    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   Consults:  Neurology   Procedures:  5/4 ETT >>  Significant Diagnostic Tests:  5/4 [facility] >> chronic changes, no acute abnormality 5/5 EEG >>abnormal, intermittent left hemispheric sharp waves.   LTM EEG 5/5 >> multiple seizures arising from left paracentral frontocentral and central parietal cortex. 5/7 left PLEDs 5/10 EEG Left frontocentral central parietal poorly formed sharply still present suggestive of some degree of cortical irritability in that region.      Micro Data:  5/4 MRSA PCR  >>neg 5/5 respiratory >> neg  Antimicrobials:  5/5 unasyn >>  Interim history/subjective:  Remains heavily sedated with Versed 130mg  per hour and Propofol 20mg    Objective   Blood pressure (!) 103/59, pulse 81, temperature 97.7 F (36.5 C), temperature source Axillary, resp. rate 16, height 5\' 11"  (1.803 m), weight 67.4 kg, SpO2 96 %.    Vent Mode: PRVC FiO2 (%):  [30 %] 30 % Set Rate:  [16 bmp] 16 bmp Vt Set:  [600 mL] 600 mL PEEP:  [5 cmH20] 5 cmH20 Plateau Pressure:  [16 cmH20-18 cmH20] 16 cmH20   Intake/Output Summary (Last 24 hours) at 07/09/2018 0813 Last data filed at 07/09/2018 0800  Gross per 24 hour  Intake 5200.68 ml  Output 1905 ml  Net 3295.68 ml   Filed Weights   07/07/18 0456 07/08/18 0500 07/09/18 0500  Weight: 66.7 kg 67.6 kg 67.4 kg    Examination:  General: thin middle aged male on vent HEENT: [place]/AT, pupils 63mm and reactive.  Neuro: Sedated.   CV: s1s2 rrr, no m/r/g PULM: clear bilateral VZ:CHYI, non-tender, bsx4 present Extremities: warm/dry, negative edema  Skin: grossly intact.   Resolved Hospital Problem list   AKI   Assessment & Plan:   Status epilepticus likely related to alcohol withdrawal  P:  Neurology is following Continuous versed and propofol directed by neurology for burst suppression.  AEDS per neuro (Vimpat, VPA, Keppra) Continuous EEG Daily thiamine and folate acid  Acute respiratory failure RLL infiltrate/ probable aspiration  P:  High-dose sedation precludes any attempt at weaning VAP bundle ABG as indicated. Unasyn course completed 5/11  HTN HLD P:  Holding home antihypertensives Continue lipitor  CAD s/p PCI, LV thrombus on coumadin,  PVD, ICM (EF 35-40% 2014) Echo this admission does not show LV thrombus P:  Continue Brilinta No need for further anticoagulation at this time as he does not have an LV thrombus as reported from previous admissions. Restart SQ heparin 5/12 if no further hematuria.   Hyperglycemia P:  CBG monitoring  Tube feeds  Depression P:  Holding home sertraline.   Best practice:  Diet: NPO, TF Pain/Anxiety/Delirium protocol (if indicated): RASS goal based  on seizure, currently -5 VAP protocol (if indicated): yes DVT prophylaxis: SCDs GI prophylaxis: PPI Glucose control: SSI sensitive Mobility: BR Code Status: Full  Family Communication: Daughter updated 5/11 Disposition: ICU    Joneen RoachPaul Antione Obar, AGACNP-BC [facility] Pulmonary/Critical Care Pager [phone] or [phone]  07/09/2018 8:23 AM

## 2018-07-09 NOTE — Progress Notes (Addendum)
Subjective: Continues to be sedated  Exam: Vitals:   07/09/18 0801 07/09/18 0900  BP:    Pulse: 81 80  Resp: 16 16  Temp:    SpO2: 96% 95%   Gen: In bed, intubated Resp: Ventilated Abd: soft, nt  Neuro: Limited by sedation  Pertinent Labs: Creatinine 0.88 Depakote level 48  Impression: 63 year old male with a history of seizures, but not on antiepileptic therapy, admitted on May 4 with status epilepticus to [facility].  Initially, LTM recorded multiple electrographic seizures with interictal epileptiform discharges on the left.  Even with aggressive up titration of sedatives, he continues to not have bur suppression pattern, with non-epileptiform-appearing activity on the left.  He is on a high-dose of Versed, and I would favor weaning this followed by weaning of propofol.  Recommendations: 1) begin weaning Versed 2) followed by weaning of propofol 3) Daily Depakote levels 4) continue vpa 300mg  q4H 5) continue keppra 1g bid 6) continue lacosamide 200mg  BID   This patient is critically ill and at significant risk of neurological worsening, death and care requires constant monitoring of vital signs, hemodynamics,respiratory and cardiac monitoring, neurological assessment, discussion with family, other specialists and medical decision making of high complexity. I spent 40 minutes of neurocritical care time  in the care of  this patient. This was time spent independent of any time provided by nurse practitioner or PA.  Ritta Slot, MD [facility] [phone]  If 7pm- 7am, please page neurology on call as listed in AMION. 07/09/2018  11:01 AM

## 2018-07-09 NOTE — Progress Notes (Signed)
EEG maint complete. No skin breakdown. Continue to monitor 

## 2018-07-10 ENCOUNTER — Inpatient Hospital Stay (HOSPITAL_COMMUNITY): Payer: Medicare Other

## 2018-07-10 ENCOUNTER — Inpatient Hospital Stay: Payer: Self-pay

## 2018-07-10 LAB — COMPREHENSIVE METABOLIC PANEL
ALT: 13 U/L (ref 0–44)
AST: 24 U/L (ref 15–41)
Albumin: 1.7 g/dL — ABNORMAL LOW (ref 3.5–5.0)
Alkaline Phosphatase: 40 U/L (ref 38–126)
Anion gap: 6 (ref 5–15)
BUN: 10 mg/dL (ref 8–23)
CO2: 22 mmol/L (ref 22–32)
Calcium: 7.6 mg/dL — ABNORMAL LOW (ref 8.9–10.3)
Chloride: 116 mmol/L — ABNORMAL HIGH (ref 98–111)
Creatinine, Ser: 0.71 mg/dL (ref 0.61–1.24)
GFR calc Af Amer: >60 mL/min (ref >60)
GFR calc non Af Amer: >60 mL/min (ref >60)
Glucose, Bld: 117 mg/dL — ABNORMAL HIGH (ref 70–99)
Potassium: 2.9 mmol/L — ABNORMAL LOW (ref 3.5–5.1)
Sodium: 144 mmol/L (ref 135–145)
Total Bilirubin: 0.6 mg/dL (ref 0.3–1.2)
Total Protein: 4.9 g/dL — ABNORMAL LOW (ref 6.5–8.1)

## 2018-07-10 LAB — SODIUM
Sodium: 145 mmol/L (ref 135–145)
Sodium: 144 mmol/L (ref 135–145)

## 2018-07-10 LAB — BASIC METABOLIC PANEL
Anion gap: 5 (ref 5–15)
Anion gap: 5 (ref 5–15)
BUN: 10 mg/dL (ref 8–23)
BUN: 8 mg/dL (ref 8–23)
CO2: 22 mmol/L (ref 22–32)
CO2: 23 mmol/L (ref 22–32)
Calcium: 7.5 mg/dL — ABNORMAL LOW (ref 8.9–10.3)
Calcium: 7.6 mg/dL — ABNORMAL LOW (ref 8.9–10.3)
Chloride: 117 mmol/L — ABNORMAL HIGH (ref 98–111)
Chloride: 116 mmol/L — ABNORMAL HIGH (ref 98–111)
Creatinine, Ser: 0.79 mg/dL (ref 0.61–1.24)
Creatinine, Ser: 0.74 mg/dL (ref 0.61–1.24)
GFR calc Af Amer: >60 mL/min (ref >60)
GFR calc Af Amer: >60 mL/min (ref >60)
GFR calc non Af Amer: >60 mL/min (ref >60)
GFR calc non Af Amer: >60 mL/min (ref >60)
Glucose, Bld: 139 mg/dL — ABNORMAL HIGH (ref 70–99)
Glucose, Bld: 100 mg/dL — ABNORMAL HIGH (ref 70–99)
Potassium: 3 mmol/L — ABNORMAL LOW (ref 3.5–5.1)
Potassium: 3.9 mmol/L (ref 3.5–5.1)
Sodium: 144 mmol/L (ref 135–145)
Sodium: 144 mmol/L (ref 135–145)

## 2018-07-10 LAB — CBC
HCT: 36.1 % — ABNORMAL LOW (ref 39.0–52.0)
Hemoglobin: 11.5 g/dL — ABNORMAL LOW (ref 13.0–17.0)
MCH: 29.4 pg (ref 26.0–34.0)
MCHC: 31.9 g/dL (ref 30.0–36.0)
MCV: 92.3 fL (ref 80.0–100.0)
Platelets: 237 10*3/uL (ref 150–400)
RBC: 3.91 MIL/uL — ABNORMAL LOW (ref 4.22–5.81)
RDW: 15.6 % — ABNORMAL HIGH (ref 11.5–15.5)
WBC: 11.2 10*3/uL — ABNORMAL HIGH (ref 4.0–10.5)
nRBC: 0 % (ref 0.0–0.2)

## 2018-07-10 LAB — VALPROIC ACID LEVEL: Valproic Acid Lvl: 70 ug/mL (ref 50.0–100.0)

## 2018-07-10 LAB — MAGNESIUM: Magnesium: 2.1 mg/dL (ref 1.7–2.4)

## 2018-07-10 LAB — GLUCOSE, CAPILLARY
Glucose-Capillary: 115 mg/dL — ABNORMAL HIGH (ref 70–99)
Glucose-Capillary: 89 mg/dL (ref 70–99)
Glucose-Capillary: 97 mg/dL (ref 70–99)
Glucose-Capillary: 100 mg/dL — ABNORMAL HIGH (ref 70–99)
Glucose-Capillary: 77 mg/dL (ref 70–99)
Glucose-Capillary: 79 mg/dL (ref 70–99)

## 2018-07-10 LAB — PHOSPHORUS: Phosphorus: 2.7 mg/dL (ref 2.5–4.6)

## 2018-07-10 MED ORDER — VITAL HIGH PROTEIN PO LIQD
1000.0000 mL | ORAL | Status: DC
  Administered 2018-07-10 – 2018-07-11 (×2): 1000 mL

## 2018-07-10 MED ORDER — SODIUM CHLORIDE 3 % IV SOLN
INTRAVENOUS | Status: DC
  Administered 2018-07-10: 50 mL/h via INTRAVENOUS
  Administered 2018-07-11 – 2018-07-12 (×4): 75 mL/h via INTRAVENOUS
  Filled 2018-07-10 (×9): qty 500

## 2018-07-10 MED ORDER — POTASSIUM CHLORIDE 20 MEQ/15ML (10%) PO SOLN
40.0000 meq | ORAL | Status: AC
  Administered 2018-07-10 (×2): 40 meq
  Filled 2018-07-10 (×2): qty 30

## 2018-07-10 MED ORDER — ADULT MULTIVITAMIN W/MINERALS CH
1.0000 | ORAL_TABLET | Freq: Every day | ORAL | Status: DC
  Administered 2018-07-10 – 2018-07-24 (×15): 1
  Filled 2018-07-10 (×16): qty 1

## 2018-07-10 MED ORDER — LEVETIRACETAM IN NACL 1500 MG/100ML IV SOLN
1500.0000 mg | Freq: Two times a day (BID) | INTRAVENOUS | Status: DC
  Administered 2018-07-10 – 2018-07-24 (×28): 1500 mg via INTRAVENOUS
  Filled 2018-07-10 (×29): qty 100

## 2018-07-10 NOTE — Progress Notes (Deleted)
Subjective: Sedation continues to be weaned, MRI revealed large right MCA infarct.  Exam: Vitals:   07/10/18 0900 07/10/18 1000  BP: 106/62 125/71  Pulse: 83 92  Resp: 16 20  Temp:    SpO2: 98% 98%   Gen: In bed, intubated Resp: Ventilated Abd: soft, nt  Neuro: Limited by sedation  Pertinent Labs: Creatinine 0.88 Depakote level 48  Impression: 63 year old male with a history of seizures, but not on antiepileptic therapy, admitted on May 4 with status epilepticus to [facility].  Initially, LTM recorded multiple electrographic seizures with interictal epileptiform discharges on the left.    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  I suspect that his seizures were precipitated by his infarct.  With weaning, he has not had any further seizures but continues to have an extremely irritable area on the left and I would favor continuing to wean his sedation while adding off another agent.  Recommendations: 1) continue weaning propofol to off  2) add Trileptal 300 mg twice daily 3) Daily Depakote levels 4) increase VPA to 350 mg every 4 hours  5) continue Keppra 1.5 g twice daily 6) continue lacosamide 200mg  BID 6) continue lacosamide 200 mg twice daily  This patient is critically ill and at significant risk of neurological worsening, death and care requires constant monitoring of vital signs, hemodynamics,respiratory and cardiac monitoring, neurological assessment, discussion with family, other specialists and medical decision making of high complexity. I spent 40 minutes of neurocritical care time  in the care of  this patient. This was time spent independent of any time provided by nurse practitioner or PA.  Ritta Slot, MD [facility] [phone]  If 7pm- 7am, please page neurology on call as listed in AMION. 07/10/2018  11:30 AM

## 2018-07-10 NOTE — Progress Notes (Signed)
Assisted with transport to and from MRI. No noted respiratory issues at this time.

## 2018-07-10 NOTE — Progress Notes (Signed)
Pt was unhooked for MRI. Pt is back from MRI and re hooked up with new leads. Pt does have skin breakdown on his forehead. Notified Neuro

## 2018-07-10 NOTE — Progress Notes (Signed)
Received order for PICC insertion.  Plan on inserting in am.

## 2018-07-10 NOTE — Progress Notes (Signed)
Nutrition Follow-up RD working remotely.  DOCUMENTATION CODES:   Not applicable  INTERVENTION:   D/C:  Osmolite 1.5 @ 50 ml/hr (1200 ml/day) 30 ml Pro-stat daily  Initiate Vital High Protein @ 50 ml/hr MVI daily  Provides: 1200 kcal, 105 grams protein, and 1003 ml free water.  TF regimen and propofol at current rate providing 1841 total kcal/day (98 % of kcal needs)   NUTRITION DIAGNOSIS:   Inadequate oral intake related to inability to eat as evidenced by NPO status. Ongoing.   GOAL:   Patient will meet greater than or equal to 90% of their needs Met.   MONITOR:   Vent status, Labs, Weight trends  REASON FOR ASSESSMENT:   Ventilator    ASSESSMENT:   63 year old male who presented to the [facility] ED on 5/04 after having a witnessed seizure. PMH of EtOH abuse, CAD, HTN, HLD, cardiomyopathy, GERD, and seizure disorder. Pt intubated in the ED and transferred to [facility].  Pt is currently off sedation. OGT in place. 5/11 remains on vent with abnormal EEG 5/12 weaning versed and increasing propofol for burst suppression  Patient is currently intubated on ventilator support MV: 9.1 L/min Temp (24hrs), Avg:96.4 F (35.8 C), Min:93.6 F (34.2 C), Max:97.7 F (36.5 C) MAP: 71 Pt 11 L positive, weight up from 62 kg to 67 kg.   Propofol @ 24.3 ml/hr (60 mcg) Medications reviewed and include: folic acid, SSI, Protonix, thiamine  Labs reviewed: K+ 3 (L) CBG's: 110-315-94  NUTRITION - FOCUSED PHYSICAL EXAM:  Unable to complete at this time. RD working remotely.  Diet Order:   Diet Order            Diet NPO time specified  Diet effective now              EDUCATION NEEDS:   No education needs have been identified at this time  Skin:  Skin Assessment: Reviewed RN Assessment  Last BM:  5/11 200 ml via rectal tube  Height:   Ht Readings from Last 1 Encounters:  07/04/18 5' 11"  (1.803 m)    Weight:   Wt Readings from Last 1 Encounters:  07/09/18 67.4 kg     Ideal Body Weight:  78.2 kg  BMI:  Body mass index is 20.72 kg/m.  Estimated Nutritional Needs:   Kcal:  1887  Protein:  90-105 grams  Fluid:  >/= 1.9 L  Maylon Peppers RD, LDN, [place] Pager [phone] After Hours Pager

## 2018-07-10 NOTE — Procedures (Signed)
   [facility] __________________  Video EEG Monitoring Report    CPT/Type of Study: 95720; 12-26hr continuous EEG with video Recording dates: 07/09/18 @07 :30 to 07/10/18 @07 :30 Recording epoch: 8 Requesting provider: Amada Jupiter Interpreting physician: Wynelle Bourgeois, MD  Indications for Procedure: Seizures Primary neurological diagnosis: Seizure  History: This is a 63 year old patient, undergoing continuous EEG to evaluate for seizures.   EEG Details: Continuous video EEG was performed using standard setting per the guidelines of American Clinical Neurophysiology Society (ACNS). A minimum of 21 electrodes were placed on scalp according to the International 10-20 or 10-10 system. Supplemental electrodes were placed as needed. Single EKG electrode was also used to detect cardiac arrhythmia. Recording was performed at a sampling rate of at least 256 Hz. Patient's behavior was continuously recorded on video simultaneously with EEG. A minimum of 18 channels were used for data display. Each epoch of study was reviewed manually daily and as needed using standard digital review software allowing for montage reformatting, gain and filter changes on a display system of sufficient resolution to prevent aliasing. Computerized quantitative EEG analysis (such as compressed spectral array analysis, dipole analysis, trending, automated spike & seizure detection) was used as indicated.  Description of EEG features: State of patient: Coma  Background Activity Overall Amplitude: low amplitude (20-30 V), asymmetric  Predominant Frequency: There is no PDR. Diffuse 1-3 Hz dominant activity is seen Superimposed Frequencies: continuous low amplitude (20-30 V) monomorphic alpha and beta range rhythms, bilateral and more pronounced in the left hemisphere and intermittent higher amplitude spindles Reactivity to stimulation: present Asymmetry: Yes - higher amplitude of all  rhythms in the left hemisphere Breach rhythm: No  Sleep: No sleep is recorded.  Background abnormalities: No Periodic or rhythmic abnormalities: No Epileptiform discharges: Yes 1. Spikes, left fronto-central Maximum: F3 Amount: ranging from 1 / 15 seconds to 1 / minute  Paroxysmal events or seizures: Yes PE type #1 - Electrographic seizure 5/11 @08 :40 Clinical: no clinical signs EEG: evolving rhythmic pattern of sharp activity over the left hemisphere, maximum fronto-central, lasting 2 minutes  Push button events: No  EKG: 70-80 bpm and regular   Impression: This continuous EEG is indicative of: 1. 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 One electrographic seizure is recorded on 5/11 @08 :40.  2. In the absence of a skull defect on the left, a possible structural abnormality in the RIGHT hemisphere may account for the amplitude asymmetry.  3. A severe diffuse encephalopathy, but non-specific as to etiology. A contribution from sedating medication effect is suspected.  Recommendations: Continue VEEG to ensure the absence of subclinical seizures and guide management.

## 2018-07-10 NOTE — Progress Notes (Signed)
.  [facility] ADULT ICU REPLACEMENT PROTOCOL FOR AM LAB REPLACEMENT ONLY  The patient does apply for the [facility] Adult ICU Electrolyte Replacment Protocol based on the criteria listed below:   1. Is GFR >/= 40 ml/min? Yes.    Patient's GFR today is >60 2. Is urine output >/= 0.5 ml/kg/hr for the last 6 hours? Yes.   Patient's UOP is 2.72 ml/kg/hr 3. Is BUN < 60 mg/dL? Yes.    Patient's BUN today is 10 4. Abnormal electrolyte(s): K+2.9 5. Ordered repletion with: protocol 6. If a panic level lab has been reported, has the CCM MD in charge been notified? Yes.  .   Physician:  Dr.Sommer  Lolita Lenz 07/10/2018 4:01 AM

## 2018-07-10 NOTE — Progress Notes (Signed)
eLink Physician-Brief Progress Note Patient Name: BRIC RARDIN DOB: 03-Jul-1955 MRN: [id number]   Date of Service  07/10/2018  HPI/Events of Note  Ca++ = 7.6 which corrects to 9.44 (normal) given albumin = 1.7. Thereforre, Ca++ replacement not indicated.   eICU Interventions  Continue present management.      Intervention Category Major Interventions: Electrolyte abnormality - evaluation and management  Sommer,Steven Eugene 07/10/2018, 4:14 AM

## 2018-07-10 NOTE — Progress Notes (Addendum)
NAME:  Edward NashJeffrey D Brittingham, MRN:  [id number], DOB:  1955-09-25, LOS: 8 ADMISSION DATE:  07/03/2018, CONSULTATION DATE:  07/08/2018 REFERRING MD:  [facility], CHIEF COMPLAINT:  seizures  Brief History   [age] yoM presenting with witnessed seizures x 3, did not return to baseline and required intubation for airway protection.  [facility]    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  5/12 weaning versed and up-titrating propofol in hopes of burst suppression.   Consults:  Neurology   Procedures:  5/4 ETT >>  Significant Diagnostic Tests:  [facility]4 [facility] changes, no acute abnormality 5/5 EEG >>abnormal, intermittent left hemispheric sharp waves.   LTM EEG 5/5 >> multiple seizures arising from left paracentral frontocentral and central parietal cortex. 5/7 left PLEDs 5/10 EEG Left frontocentral central parietal poorly formed sharply still present suggestive of some degree of cortical irritability in that region.   5/11 EEG  Occasionally left frontocentral sharp waves and spikes present suggestive of some degree of cortical irritability. No clinical or subclinical seizures present throughout the recording.    Micro Data:  5/4 MRSA PCR  >>neg 5/5 respiratory >> neg  Antimicrobials:  5/5 unasyn > 5/11  Interim history/subjective:  Remains heavily sedated with Versed 75mg  per hour and Propofol 60mg    Objective   Blood pressure 97/62, pulse 71, temperature (!) 94.4 F (34.7 C), temperature source Rectal, resp. rate 16, height 5\' 11"  (1.803 m), weight 67.4 kg, SpO2 100 %.     Vent Mode: PRVC FiO2 (%):  [30 %] 30 % Set Rate:  [16 bmp] 16 bmp Vt Set:  [600 mL] 600 mL PEEP:  [5 cmH20] 5 cmH20 Plateau Pressure:  [16 cmH20-17 cmH20] 16 cmH20   Intake/Output Summary (Last 24 hours) at 07/10/2018 0756 Last data filed at 07/10/2018 0345 Gross per 24 hour  Intake 3180.62 ml  Output 1705 ml  Net 1475.62 ml   Filed Weights   07/07/18 0456 07/08/18 0500 07/09/18 0500  Weight: 66.7 kg 67.6 kg 67.4 kg    Examination:  General: thin middle aged male on vent HEENT: [place]/AT, no JVD Neuro: Sedated.   CV: s1s2 rrr, no m/r/g PULM: Clear WU:JWJXGI:soft, non-tender, bsx4 present Extremities: warm/dry, negative edema  Skin: grossly intact.   Resolved Hospital Problem list   AKI   Assessment & Plan:   Status epilepticus likely initiated by alcohol withdrawal  P:  Neurology is following Continuous versed and propofol directed by neurology for burst suppression.  AEDS per neuro (Vimpat, VPA, Keppra) Continuous EEG Daily thiamine and folic acid  Acute respiratory failure RLL infiltrate/ probable aspiration: completed 7 day course of Unasyn 5/11 Hypothermia P:  High-dose sedation precludes any attempt at weaning VAP bundle Bair hugger Low threshold to re-culture  HTN HLD P:  Holding home antihypertensives Continue lipitor  CAD s/p PCI, LV thrombus on coumadin,  PVD, ICM (EF 35-40% 2014) Echo this admission does not show LV thrombus P:  Continue Brilinta No need for further anticoagulation at this time as he does not have an LV thrombus as reported from  previous admissions. Lovenox for VTE ppx  Hyperglycemia P:  CBG monitoring  Tube feeds  Depression P:  Holding home sertraline.   Best practice:  Diet: NPO, TF Pain/Anxiety/Delirium protocol (if indicated): sedation needs based on EEG VAP protocol (if indicated): yes DVT prophylaxis: SCDs GI prophylaxis: PPI Glucose control: SSI sensitive Mobility: BR Code Status: Full  Family Communication:  Daughter updated 5/12 Disposition: ICU    Joneen Roach, AGACNP-BC [facility] Pulmonary/Critical Care Pager [phone] or [phone]  07/10/2018 7:56 AM

## 2018-07-10 NOTE — Progress Notes (Signed)
eLink Physician-Brief Progress Note Patient Name: Edward Cooper DOB: Mar 15, 1955 MRN: [id number]   Date of Service  07/10/2018  HPI/Events of Note  Hypothermia - Temp = 93.6 F.  eICU Interventions  Will order [employer].      Intervention Category Major Interventions: Other:  Lenell Antu 07/10/2018, 5:39 AM

## 2018-07-10 NOTE — Progress Notes (Signed)
Reglued LTM completely. Skin breakdown was seen at Fp1 so moved Fp1 and Fp2.

## 2018-07-11 ENCOUNTER — Inpatient Hospital Stay (HOSPITAL_COMMUNITY): Payer: Medicare Other

## 2018-07-11 LAB — CBC
HCT: 39.5 % (ref 39.0–52.0)
Hemoglobin: 12.9 g/dL — ABNORMAL LOW (ref 13.0–17.0)
MCH: 30.1 pg (ref 26.0–34.0)
MCHC: 32.7 g/dL (ref 30.0–36.0)
MCV: 92.3 fL (ref 80.0–100.0)
Platelets: 359 10*3/uL (ref 150–400)
RBC: 4.28 MIL/uL (ref 4.22–5.81)
RDW: 15.9 % — ABNORMAL HIGH (ref 11.5–15.5)
WBC: 13.8 10*3/uL — ABNORMAL HIGH (ref 4.0–10.5)
nRBC: 0 % (ref 0.0–0.2)

## 2018-07-11 LAB — GLUCOSE, CAPILLARY
Glucose-Capillary: 96 mg/dL (ref 70–99)
Glucose-Capillary: 106 mg/dL — ABNORMAL HIGH (ref 70–99)
Glucose-Capillary: 117 mg/dL — ABNORMAL HIGH (ref 70–99)
Glucose-Capillary: 108 mg/dL — ABNORMAL HIGH (ref 70–99)
Glucose-Capillary: 106 mg/dL — ABNORMAL HIGH (ref 70–99)

## 2018-07-11 LAB — PHOSPHORUS: Phosphorus: 1.7 mg/dL — ABNORMAL LOW (ref 2.5–4.6)

## 2018-07-11 LAB — BASIC METABOLIC PANEL
Anion gap: 6 (ref 5–15)
BUN: 9 mg/dL (ref 8–23)
CO2: 22 mmol/L (ref 22–32)
Calcium: 7.6 mg/dL — ABNORMAL LOW (ref 8.9–10.3)
Chloride: 119 mmol/L — ABNORMAL HIGH (ref 98–111)
Creatinine, Ser: 0.8 mg/dL (ref 0.61–1.24)
GFR calc Af Amer: >60 mL/min (ref >60)
GFR calc non Af Amer: >60 mL/min (ref >60)
Glucose, Bld: 117 mg/dL — ABNORMAL HIGH (ref 70–99)
Potassium: 3.7 mmol/L (ref 3.5–5.1)
Sodium: 147 mmol/L — ABNORMAL HIGH (ref 135–145)

## 2018-07-11 LAB — SODIUM
Sodium: 144 mmol/L (ref 135–145)
Sodium: 149 mmol/L — ABNORMAL HIGH (ref 135–145)
Sodium: 151 mmol/L — ABNORMAL HIGH (ref 135–145)
Sodium: 153 mmol/L — ABNORMAL HIGH (ref 135–145)

## 2018-07-11 LAB — TRIGLYCERIDES: Triglycerides: 200 mg/dL — ABNORMAL HIGH (ref <150)

## 2018-07-11 LAB — VALPROIC ACID LEVEL: Valproic Acid Lvl: 42 ug/mL — ABNORMAL LOW (ref 50.0–100.0)

## 2018-07-11 LAB — MAGNESIUM: Magnesium: 1.7 mg/dL (ref 1.7–2.4)

## 2018-07-11 MED ORDER — LORAZEPAM 2 MG/ML IJ SOLN
2.0000 mg | Freq: Once | INTRAMUSCULAR | Status: AC
  Administered 2018-07-11: 20:00:00 2 mg via INTRAVENOUS
  Filled 2018-07-11: qty 1

## 2018-07-11 MED ORDER — VALPROATE SODIUM 500 MG/5ML IV SOLN
350.0000 mg | INTRAVENOUS | Status: DC
  Administered 2018-07-11 – 2018-07-12 (×7): 350 mg via INTRAVENOUS
  Filled 2018-07-11 (×12): qty 3.5

## 2018-07-11 MED ORDER — MAGNESIUM SULFATE 2 GM/50ML IV SOLN
2.0000 g | Freq: Once | INTRAVENOUS | Status: AC
  Administered 2018-07-11: 2 g via INTRAVENOUS
  Filled 2018-07-11: qty 50

## 2018-07-11 MED ORDER — SODIUM CHLORIDE 0.9% FLUSH
10.0000 mL | Freq: Two times a day (BID) | INTRAVENOUS | Status: DC
  Administered 2018-07-11: 10 mL
  Administered 2018-07-11 – 2018-07-12 (×2): 20 mL
  Administered 2018-07-13 – 2018-07-19 (×10): 10 mL
  Administered 2018-07-19 – 2018-07-21 (×4): 20 mL
  Administered 2018-07-21 – 2018-07-22 (×3): 10 mL
  Administered 2018-07-23: 30 mL
  Administered 2018-07-23 – 2018-07-24 (×2): 10 mL

## 2018-07-11 MED ORDER — POTASSIUM CHLORIDE 20 MEQ/15ML (10%) PO SOLN
20.0000 meq | ORAL | Status: AC
  Administered 2018-07-11 (×2): 20 meq
  Filled 2018-07-11: qty 15

## 2018-07-11 MED ORDER — OXCARBAZEPINE 300 MG PO TABS: 300.0000 mg | ORAL_TABLET | Freq: Two times a day (BID) | ORAL | Status: DC

## 2018-07-11 MED ORDER — OXYBUTYNIN CHLORIDE 5 MG/5ML PO SYRP
5.0000 mg | ORAL_SOLUTION | Freq: Two times a day (BID) | ORAL | Status: DC
  Administered 2018-07-11: 5 mg
  Filled 2018-07-11 (×3): qty 5

## 2018-07-11 MED ORDER — CHLORHEXIDINE GLUCONATE CLOTH 2 % EX PADS
6.0000 | MEDICATED_PAD | Freq: Every day | CUTANEOUS | Status: DC
  Administered 2018-07-11 – 2018-07-16 (×5): 6 via TOPICAL

## 2018-07-11 MED ORDER — CHLORHEXIDINE GLUCONATE CLOTH 2 % EX PADS: 6.0000 | MEDICATED_PAD | Freq: Every day | CUTANEOUS | Status: DC

## 2018-07-11 MED ORDER — STROKE: EARLY STAGES OF RECOVERY BOOK
Freq: Once | Status: AC
  Administered 2018-07-11: 20:00:00

## 2018-07-11 MED ORDER — OXCARBAZEPINE 300 MG PO TABS
300.0000 mg | ORAL_TABLET | Freq: Two times a day (BID) | ORAL | Status: DC
  Administered 2018-07-11 – 2018-07-12 (×2): 300 mg
  Filled 2018-07-11 (×3): qty 1

## 2018-07-11 MED ORDER — OXCARBAZEPINE 300 MG PO TABS
600.0000 mg | ORAL_TABLET | Freq: Once | ORAL | Status: AC
  Administered 2018-07-11: 12:00:00 600 mg via ORAL
  Filled 2018-07-11: qty 2

## 2018-07-11 MED ORDER — SODIUM CHLORIDE 0.9% FLUSH: 10.0000 mL | INTRAVENOUS | Status: DC | PRN

## 2018-07-11 NOTE — Progress Notes (Signed)
eLink Physician-Brief Progress Note Patient Name: Edward Cooper DOB: 1955/03/10 MRN: [id number]   Date of Service  07/11/2018  HPI/Events of Note  Loose stools, no fever and no history of antibiotic administration.  eICU Interventions  C-Diff screening not indicated yet.        [name] [name] 07/11/2018, 6:30 AM

## 2018-07-11 NOTE — Progress Notes (Signed)
NAME:  Edward Cooper, MRN:  [id number], DOB:  05-Nov-1955, LOS: 9 ADMISSION DATE:  07/23/2018, CONSULTATION DATE:  07/11/2018 REFERRING MD:  [facility], CHIEF COMPLAINT:  seizures  Brief History   [age] yoM presenting with witnessed seizures x 3, did not return to baseline and required intubation for airway protection.  [facility]    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  5/12 weaning versed and up-titrating propofol in hopes of burst suppression.   Consults:  Neurology   Procedures:  5/4 ETT >> 5/13 Rt PICC >>   Significant Diagnostic Tests:  [facility]/4 [facility] >> chronic changes, no acute abnormality 5/5 EEG >>abnormal, intermittent left hemispheric sharp waves.   LTM EEG 5/5 >> multiple seizures arising from left paracentral frontocentral and central parietal cortex. 5/7 left PLEDs 5/10 EEG Left frontocentral central parietal poorly formed sharply still present suggestive of some degree of cortical irritability in that region.   5/11 EEG  Occasionally left frontocentral sharp waves and spikes present suggestive of some degree of cortical irritability. No clinical or subclinical seizures present throughout the recording.    Micro Data:  5/4 MRSA PCR  >>neg 5/5 respiratory >> neg  Antimicrobials:  5/5 unasyn > 5/11  Interim history/subjective:  Remains on vent.  Objective   Blood pressure 115/64, pulse 92, temperature 98.7 F (37.1 C), temperature source Axillary, resp. rate 17, height 5\' 11"  (1.803 m), weight 67.6 kg, SpO2 95 %.    Vent Mode: PRVC FiO2 (%):  [30  %] 30 % Set Rate:  [16 bmp] 16 bmp Vt Set:  [600 mL] 600 mL PEEP:  [5 cmH20] 5 cmH20 Plateau Pressure:  [15 cmH20-21 cmH20] 16 cmH20   Intake/Output Summary (Last 24 hours) at 07/11/2018 1047 Last data filed at 07/11/2018 0700 Gross per 24 hour  Intake 2680.76 ml  Output 2525 ml  Net 155.76 ml   Filed Weights   07/08/18 0500 07/09/18 0500 07/11/18 0500  Weight: 67.6 kg 67.4 kg 67.6 kg    Examination:   General - sedated Eyes - pupils reactive ENT - ETT in place Cardiac - regular rate/rhythm, no murmur Chest - decreased BS Abdomen - soft, non tender, + bowel sounds Extremities - no cyanosis, clubbing, or edema Skin - no rashes Neuro - RASS -4  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  Resolved Hospital Problem list   AKI   Assessment & Plan:   Status epilepticus likely initiated by alcohol withdrawal  P:  - continue AEDs, LTM, burst suppression  Acute respiratory failure RLL infiltrate/ probable aspiration: completed 7 day course of Unasyn 5/11 Hypothermia P:  - full vent support  HTN HLD P:  - monitor hemodynamics  CAD s/p PCI, LV thrombus on coumadin,  PVD, ICM (EF 35-40% 2014) Echo this admission does not show LV thrombus P:  - continue brilinta - no further anticoagulation at this time as he doesn't have LV thrombus anymore  Hyperglycemia P:  - SSI  Depression P:  - hold zoloft  Best practice:  Diet: NPO, TF Pain/Anxiety/Delirium protocol (if indicated):  sedation needs based on EEG VAP protocol (if indicated): yes DVT prophylaxis: SCDs GI prophylaxis: PPI Glucose control: SSI sensitive Mobility: BR Code Status: Full  Family Communication: Daughter updated 5/12 Disposition: ICU      CMP Latest Ref Rng & Units 07/11/2018 07/11/2018 07/10/2018  Glucose 70 - 99 mg/dL 372(B) - -  BUN 8 - 23 mg/dL 9 - -  Creatinine 0.21 - 1.24 mg/dL 1.15 - -  Sodium 520 - 145 mmol/L 147(H) 144 144  Potassium 3.5 - 5.1 mmol/L 3.7 - -  Chloride 98 - 111 mmol/L 119(H)  - -  CO2 22 - 32 mmol/L 22 - -  Calcium 8.9 - 10.3 mg/dL 7.6(L) - -  Total Protein 6.5 - 8.1 g/dL - - -  Total Bilirubin 0.3 - 1.2 mg/dL - - -  Alkaline Phos 38 - 126 U/L - - -  AST 15 - 41 U/L - - -  ALT 0 - 44 U/L - - -   CBC Latest Ref Rng & Units 07/11/2018 07/10/2018 07/09/2018  WBC 4.0 - 10.5 K/uL 13.8(H) 11.2(H) 12.5(H)  Hemoglobin 13.0 - 17.0 g/dL 12.9(L) 11.5(L) 12.9(L)  Hematocrit 39.0 - 52.0 % 39.5 36.1(L) 39.9  Platelets 150 - 400 K/uL 359 237 187   ABG    Component Value Date/Time   PHART 7.429 07/09/2018 0910   PCO2ART 36.4 07/09/2018 0910   PO2ART 69.5 (L) 07/09/2018 0910   HCO3 23.7 07/09/2018 0910   TCO2 23 07/03/2018 0000   ACIDBASEDEF 0.1 07/09/2018 0910   O2SAT 93.6 07/09/2018 0910   CBG (last 3)  Recent Labs    07/10/18 2318 07/11/18 0322 07/11/18 0824  GLUCAP 79 96 106*    D/w Dr. Amada Jupiter  CC time 31 minutes  Coralyn Helling, MD [facility] Pulmonary/Critical Care 07/11/2018, 10:51 AM

## 2018-07-11 NOTE — Procedures (Signed)
   [facility] __________________  Video EEG Monitoring Report    CPT/Type of Study: 95720; 12-26hr continuous EEG with video Recording dates: 07/10/18 @07 :30 to 07/11/18 @07 :30 Recording epoch: 9 Requesting provider: Amada Jupiter Interpreting physician: Wynelle Bourgeois, MD  Indications for Procedure: Seizures Primary neurological diagnosis: Seizure  History: This is a 63 year old patient, undergoing continuous EEG to evaluate for seizures.   EEG Details: Continuous video EEG was performed using standard setting per the guidelines of American Clinical Neurophysiology Society (ACNS). A minimum of 21 electrodes were placed on scalp according to the International 10-20 or 10-10 system. Supplemental electrodes were placed as needed. Single EKG electrode was also used to detect cardiac arrhythmia. Recording was performed at a sampling rate of at least 256 Hz. Patient's behavior was continuously recorded on video simultaneously with EEG. A minimum of 18 channels were used for data display. Each epoch of study was reviewed manually daily and as needed using standard digital review software allowing for montage reformatting, gain and filter changes on a display system of sufficient resolution to prevent aliasing. Computerized quantitative EEG analysis (such as compressed spectral array analysis, dipole analysis, trending, automated spike & seizure detection) was used as indicated.  There is a break in the recording from 13:53 to 16:52.  Description of EEG features: State of patient: Coma <--> Stupor   Background Activity Overall Amplitude: low amplitude (20-30 V), asymmetric  Predominant Frequency: There is no PDR. Diffuse 3 - 6 Hz dominant activity is seen Superimposed Frequencies: continuous medium amplitude (20-30 V) monomorphic alpha and beta range rhythms, bilateral and more pronounced in the left hemisphere and intermittent higher amplitude  spindles Reactivity to stimulation: present Asymmetry: Yes - higher amplitude of all rhythms in the left hemisphere Breach rhythm: No  Sleep: No sleep is recorded.  Background abnormalities: Yes 1. Continuous slow, right>left, generalized After 16:53 progressively higher amplitude better formed background is seen but with persistent asymmetry and spindle like activity.   Periodic or rhythmic abnormalities: No  Epileptiform discharges: Yes 1. Spikes, left fronto-central Maximum: F3 Amount: ranging from 1 / 15 seconds to 1 / minute  Paroxysmal events or seizures: No  Push button events: No  EKG: 70-80 bpm and regular   Impression: This continuous EEG is indicative of: 1. 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   2. In the absence of a skull defect on the left, a possible structural abnormality in the RIGHT hemisphere may account for the amplitude asymmetry.  3. A severe, somewhat improving diffuse encephalopathy, but non-specific as to etiology. A contribution from sedating medication effect is suspected.  No seizure is recorded since 5/11 @08 :40.  Recommendations: Continue VEEG to ensure the absence of subclinical seizures and guide management.

## 2018-07-11 NOTE — Progress Notes (Signed)
LTM maintenance completed; Fixed A2, added prep to CZ, no skin breakdown was seen.

## 2018-07-11 NOTE — Progress Notes (Signed)
[facility] ADULT ICU REPLACEMENT PROTOCOL FOR AM LAB REPLACEMENT ONLY  The patient does apply for the [facility] Adult ICU Electrolyte Replacment Protocol based on the criteria listed below:   1. Is GFR >/= 40 ml/min? Yes.    Patient's GFR today is >60 2. Is urine output >/= 0.5 ml/kg/hr for the last 6 hours? Yes.   Patient's UOP is 1.6 ml/kg/hr 3. Is BUN < 60 mg/dL? Yes.    Patient's BUN today is 9 4. Abnormal electrolyte(s): K-3.7, Mag-1.7 5. Ordered repletion with: per protocol 6. If a panic level lab has been reported, has the CCM MD in charge been notified? Yes.  .   Physician:  Dr. Heywood Bene, Dixon Boos 07/11/2018 6:28 AM

## 2018-07-11 NOTE — Progress Notes (Signed)
Peripherally Inserted Central Catheter/Midline Placement  The IV Nurse has discussed with the patient and/or persons authorized to consent for the patient, the purpose of this procedure and the potential benefits and risks involved with this procedure.  The benefits include less needle sticks, lab draws from the catheter, and the patient may be discharged home with the catheter. Risks include, but not limited to, infection, bleeding, blood clot (thrombus formation), and puncture of an artery; nerve damage and irregular heartbeat and possibility to perform a PICC exchange if needed/ordered by physician.  Alternatives to this procedure were also discussed.  Bard Power PICC patient education guide, fact sheet on infection prevention and patient information card has been provided to patient /or left at bedside.    PICC/Midline Placement Documentation  PICC Double Lumen 07/11/18 PICC Right Basilic 42 cm 0 cm (Active)  Indication for Insertion or Continuance of Line Prolonged intravenous therapies 07/11/2018  9:30 AM  Exposed Catheter (cm) 0 cm 07/11/2018  9:30 AM  Site Assessment Clean;Dry;Intact 07/11/2018  9:30 AM  Lumen #1 Status Flushed;Blood return noted;Saline locked 07/11/2018  9:30 AM  Lumen #2 Status Blood return noted;Saline locked;Flushed 07/11/2018  9:30 AM  Dressing Type Transparent 07/11/2018  9:30 AM  Dressing Status Clean;Dry;Intact;Antimicrobial disc in place 07/11/2018  9:30 AM  Dressing Change Due 07/18/18 07/11/2018  9:30 AM       Audrie Gallus 07/11/2018, 9:43 AM

## 2018-07-11 NOTE — Progress Notes (Signed)
Transported patient to CT and back to 4N20 without event. 

## 2018-07-11 NOTE — Progress Notes (Signed)
eLink Physician-Brief Progress Note Patient Name: Edward Cooper DOB: [birth year] MRN: [id number]   Date of Service  07/11/2018  HPI/Events of Note  Pt with a hx of bladder spasms on rectal Belladona supp but he has a rectal tube in place for loose stools.  eICU Interventions  Belladonna supp discontinued. Oxybutynin syrup 5 mg via NG Tube Q 12 hours        [name] 07/11/2018, 11:11 PM

## 2018-07-11 NOTE — Progress Notes (Addendum)
Subjective: Sedation continues to be weaned, MRI revealed large right MCA infarct.  Exam: Vitals:   07/11/18 1111 07/11/18 1200  BP:    Pulse: 94   Resp: 16   Temp:  99.1 F (37.3 C)  SpO2: 94%    Gen: In bed, intubated Resp: Ventilated Abd: soft, nt  Neuro:  Pupils are reactive bilaterally, he has no movement to noxious stimulation of the right arm or leg, extension to noxious stimulation in the left arm and minimal flexion of the left leg  Pertinent Labs: Creatinine 0.88 Depakote level 48  Impression: 63 year old male with a history of seizures, but not on antiepileptic therapy, admitted on May 4 with status epilepticus to [facility].  Initially, LTM recorded multiple electrographic seizures with interictal epileptiform discharges on the left.    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  I suspect that his seizures were precipitated by his infarct.  With weaning, he has not had any further seizures but continues to have an extremely irritable area on the left and I would favor continuing to wean his sedation while adding off another agent.  Recommendations: 1) continue weaning propofol to off  2) add Trileptal 300 mg twice daily 3) Daily Depakote levels 4) increase VPA to 350 mg every 4 hours  5) continue Keppra 1.5 g twice daily  6) continue lacosamide 200mg  BID 7) continue hyperosmolar therapy for cerebral edema  This patient is critically ill and at significant risk of neurological worsening, death and care requires constant monitoring of vital signs, hemodynamics,respiratory and cardiac monitoring, neurological assessment, discussion with family, other specialists and medical decision making of high complexity. I spent 40 minutes of neurocritical care time  in the care of  this patient. This was time spent independent of any time provided by nurse practitioner or PA.  Ritta Slot, MD [facility] [phone]  If  7pm- 7am, please page neurology on call as listed in AMION. 07/11/2018  1:14 PM

## 2018-07-11 NOTE — Progress Notes (Signed)
SLP Cancellation Note  Patient Details Name: SASUKE RHYAN MRN: [id number] DOB: 1955/11/23   Cancelled treatment:       Reason Eval/Treat Not Completed: Medical issues which prohibited therapy;Patient not medically ready(Pt is currently intubated. SLP will follow up. )  Ameen Mostafa I. Vear Clock, MS, CCC-SLP Acute Rehabilitation Services Office number [phone] Pager [phone]  Scheryl Marten 07/11/2018, 1:14 PM

## 2018-07-11 NOTE — Progress Notes (Signed)
Subjective: Continues to be sedated  Exam: Vitals:   07/11/18 1111 07/11/18 1200  BP:    Pulse: 94   Resp: 16   Temp:  99.1 F (37.3 C)  SpO2: 94%    Gen: In bed, intubated Resp: Ventilated Abd: soft, nt  Neuro: Limited by sedation  Pertinent Labs: Creatinine 0.88 Depakote level 48  Impression: 63 year old male with a history of seizures, but not on antiepileptic therapy, admitted on May 4 with status epilepticus to [facility].  Initially, LTM recorded multiple electrographic seizures with interictal epileptiform discharges on the left.  He will need repeated brain imaging  Recommendations: 1) begin weaning Versed 2) followed by weaning of propofol 3) Daily Depakote levels 4) continue vpa 300mg  q4H 5) increase Keppra to 1.5 g twice daily 6) continue lacosamide 200mg  BID 7) repeat brain MRI  This patient is critically ill and at significant risk of neurological worsening, death and care requires constant monitoring of vital signs, hemodynamics,respiratory and cardiac monitoring, neurological assessment, discussion with family, other specialists and medical decision making of high complexity. I spent 40 minutes of neurocritical care time  in the care of  this patient. This was time spent independent of any time provided by nurse practitioner or PA.  Ritta Slot, MD [facility] [phone]

## 2018-07-12 ENCOUNTER — Inpatient Hospital Stay (HOSPITAL_COMMUNITY): Payer: Medicare Other

## 2018-07-12 DIAGNOSIS — I639 Cerebral infarction, unspecified: Secondary | ICD-10-CM

## 2018-07-12 LAB — GLUCOSE, CAPILLARY
Glucose-Capillary: 117 mg/dL — ABNORMAL HIGH (ref 70–99)
Glucose-Capillary: 120 mg/dL — ABNORMAL HIGH (ref 70–99)
Glucose-Capillary: 120 mg/dL — ABNORMAL HIGH (ref 70–99)
Glucose-Capillary: 120 mg/dL — ABNORMAL HIGH (ref 70–99)
Glucose-Capillary: 122 mg/dL — ABNORMAL HIGH (ref 70–99)
Glucose-Capillary: 123 mg/dL — ABNORMAL HIGH (ref 70–99)
Glucose-Capillary: 131 mg/dL — ABNORMAL HIGH (ref 70–99)

## 2018-07-12 LAB — LIPID PANEL
Cholesterol: 137 mg/dL (ref 0–200)
HDL: 12 mg/dL — ABNORMAL LOW (ref >40)
LDL Cholesterol: 84 mg/dL (ref 0–99)
Total CHOL/HDL Ratio: 11.4 RATIO
Triglycerides: 203 mg/dL — ABNORMAL HIGH (ref <150)
VLDL: 41 mg/dL — ABNORMAL HIGH (ref 0–40)

## 2018-07-12 LAB — BASIC METABOLIC PANEL
Anion gap: 8 (ref 5–15)
BUN: 13 mg/dL (ref 8–23)
CO2: 21 mmol/L — ABNORMAL LOW (ref 22–32)
Calcium: 7.8 mg/dL — ABNORMAL LOW (ref 8.9–10.3)
Chloride: 125 mmol/L — ABNORMAL HIGH (ref 98–111)
Creatinine, Ser: 0.78 mg/dL (ref 0.61–1.24)
GFR calc Af Amer: >60 mL/min (ref >60)
GFR calc non Af Amer: >60 mL/min (ref >60)
Glucose, Bld: 165 mg/dL — ABNORMAL HIGH (ref 70–99)
Potassium: 3.2 mmol/L — ABNORMAL LOW (ref 3.5–5.1)
Sodium: 154 mmol/L — ABNORMAL HIGH (ref 135–145)

## 2018-07-12 LAB — SODIUM
Sodium: 158 mmol/L — ABNORMAL HIGH (ref 135–145)
Sodium: 159 mmol/L — ABNORMAL HIGH (ref 135–145)

## 2018-07-12 LAB — CBC
HCT: 37.8 % — ABNORMAL LOW (ref 39.0–52.0)
Hemoglobin: 11.9 g/dL — ABNORMAL LOW (ref 13.0–17.0)
MCH: 30 pg (ref 26.0–34.0)
MCHC: 31.5 g/dL (ref 30.0–36.0)
MCV: 95.2 fL (ref 80.0–100.0)
Platelets: 400 10*3/uL (ref 150–400)
RBC: 3.97 MIL/uL — ABNORMAL LOW (ref 4.22–5.81)
RDW: 16.1 % — ABNORMAL HIGH (ref 11.5–15.5)
WBC: 20.7 10*3/uL — ABNORMAL HIGH (ref 4.0–10.5)
nRBC: 0 % (ref 0.0–0.2)

## 2018-07-12 LAB — HEMOGLOBIN A1C
Hgb A1c MFr Bld: 5.3 % (ref 4.8–5.6)
Mean Plasma Glucose: 105.41 mg/dL

## 2018-07-12 LAB — C DIFFICILE QUICK SCREEN W PCR REFLEX
C Diff antigen: POSITIVE — AB
C Diff toxin: NEGATIVE

## 2018-07-12 LAB — VALPROIC ACID LEVEL: Valproic Acid Lvl: 31 ug/mL — ABNORMAL LOW (ref 50.0–100.0)

## 2018-07-12 LAB — CLOSTRIDIUM DIFFICILE BY PCR, REFLEXED: Toxigenic C. Difficile by PCR: POSITIVE — AB

## 2018-07-12 LAB — MAGNESIUM: Magnesium: 2.3 mg/dL (ref 1.7–2.4)

## 2018-07-12 MED ORDER — OSMOLITE 1.5 CAL PO LIQD
1000.0000 mL | ORAL | Status: DC
  Administered 2018-07-12 – 2018-07-22 (×8): 1000 mL
  Filled 2018-07-12 (×15): qty 1000

## 2018-07-12 MED ORDER — VALPROATE SODIUM 500 MG/5ML IV SOLN
1000.0000 mg | Freq: Once | INTRAVENOUS | Status: AC
  Administered 2018-07-12: 15:00:00 1000 mg via INTRAVENOUS
  Filled 2018-07-12: qty 10

## 2018-07-12 MED ORDER — OXCARBAZEPINE 300 MG PO TABS
600.0000 mg | ORAL_TABLET | Freq: Two times a day (BID) | ORAL | Status: DC
  Administered 2018-07-12 – 2018-07-24 (×24): 600 mg
  Filled 2018-07-12 (×25): qty 2

## 2018-07-12 MED ORDER — VALPROATE SODIUM 500 MG/5ML IV SOLN
400.0000 mg | INTRAVENOUS | Status: DC
  Administered 2018-07-12 – 2018-07-13 (×4): 400 mg via INTRAVENOUS
  Filled 2018-07-12 (×8): qty 4

## 2018-07-12 MED ORDER — PRO-STAT SUGAR FREE PO LIQD
30.0000 mL | Freq: Two times a day (BID) | ORAL | Status: DC
  Administered 2018-07-12 – 2018-07-24 (×24): 30 mL via ORAL
  Filled 2018-07-12 (×25): qty 30

## 2018-07-12 MED ORDER — VANCOMYCIN 50 MG/ML ORAL SOLUTION
125.0000 mg | Freq: Four times a day (QID) | ORAL | Status: DC
  Administered 2018-07-12 – 2018-07-24 (×49): 125 mg via ORAL
  Filled 2018-07-12 (×56): qty 2.5

## 2018-07-12 NOTE — Progress Notes (Signed)
Nutrition Follow-up RD working remotely.  DOCUMENTATION CODES:   Not applicable  INTERVENTION:   D/C:  Vital High Protein  Osmolite 1.5 @ 45 ml/hr (1080 ml/day) 30 ml Pro-stat BID MVI  Provides: 1820 kcal, 97 grams protein, and 825 ml free water.   NUTRITION DIAGNOSIS:   Inadequate oral intake related to inability to eat as evidenced by NPO status. Ongoing.   GOAL:   Patient will meet greater than or equal to 90% of their needs Met.   MONITOR:   Vent status, Labs, Weight trends  REASON FOR ASSESSMENT:   Ventilator    ASSESSMENT:   63 year old male who presented to the [facility] ED on 5/04 after having a witnessed seizure. PMH of EtOH abuse, CAD, HTN, HLD, cardiomyopathy, GERD, and seizure disorder. Pt intubated in the ED and transferred to [facility].  Pt is currently off sedation. OGT in place. 5/11 remains on vent with abnormal EEG 5/12 weaning versed and increasing propofol for burst suppression 5/13 Propofol weaned off  Patient is currently intubated on ventilator support MV: 10.6 L/min Temp (24hrs), Avg:99.6 F (37.6 C), Min:98.1 F (36.7 C), Max:101.1 F (38.4 C) Pt 12 L positive, weight up from 62 kg to 72.7 kg.   Propofol off Medications reviewed and include: folic acid, SSI, thiamine, MVI, 3% hypertonic saline  Labs reviewed: Na 154 (H), K+ 3.2 (L), TG: 203 (H) CBG's: 120  TF via OG tube:  Vital High Protein @ 50 ml/hr Provides: 1200 kcal, 105 grams protein, and 1003 ml free water.    NUTRITION - FOCUSED PHYSICAL EXAM:  Unable to complete at this time. RD working remotely.  Diet Order:   Diet Order            Diet NPO time specified  Diet effective now              EDUCATION NEEDS:   No education needs have been identified at this time  Skin:  Skin Assessment: Reviewed RN Assessment  Last BM:  1200 ml via rectal tube, cdiff pending  Height:   Ht Readings from Last 1 Encounters:  07/04/18 _0  (1.803 m)    Weight:   Wt  Readings from Last 1 Encounters:  07/12/18 72.7 kg    Ideal Body Weight:  78.2 kg  BMI:  Body mass index is 22.35 kg/m.  Estimated Nutritional Needs:   Kcal:  1887  Protein:  90-105 grams  Fluid:  >/= 1.9 L  Maylon Peppers RD, LDN, [place] Pager [phone] After Hours Pager

## 2018-07-12 NOTE — Progress Notes (Signed)
NAME:  Edward Cooper, MRN:  [id number], DOB:  Aug 28, 1955, LOS: 10 ADMISSION DATE:  2018-07-09, CONSULTATION DATE:  07-09-18 REFERRING MD:  [facility], CHIEF COMPLAINT:  seizures  Brief History   89 yoM presenting with witnessed seizures x 3, did not return to baseline and required intubation for airway protection.  Transferred from [facility] to [facility] for continuous EEG.    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  5/12 weaning versed and up-titrating propofol in hopes of burst suppression.   Consults:  Neurology   Procedures:  5/4 ETT >> 5/13 Rt PICC >>   Significant Diagnostic Tests:  5/4 [facility] >> chronic changes, no acute abnormality 5/5 EEG >>abnormal, intermittent left hemispheric sharp waves.   LTM EEG 5/5 >> multiple seizures arising from left paracentral frontocentral and central parietal cortex. 5/7 left PLEDs 5/10 EEG Left frontocentral central parietal poorly formed sharply still present suggestive of some degree of cortical irritability in that region.   5/11 EEG  Occasionally left frontocentral sharp waves and spikes present suggestive of some degree of cortical irritability. No clinical or subclinical seizures present throughout the recording.  5/12 MRI >> Extensive RIGHT hemisphere infarct, RIGHT MCA territory, both acute and subacute, with areas of reperfusion hemorrhage affecting not only cortex but the basal ganglia.Some extra-axial hemorrhage on the RIGHT  Micro Data:  5/4 MRSA PCR  >>neg 5/5 respiratory >> neg 5/14 C diff Ag POS, PCR positive  Antimicrobials:  5/5 unasyn > 5/11 5/14  vancomycin oral >>  Interim history/subjective:   Febrile 101 last 24 hours Remains critically ill, intubated, on LTM EEG Loose stools continue Objective   Blood pressure 121/80, pulse 92, temperature 98.2 F (36.8 C), temperature source Axillary, resp. rate 20, height 5\' 11"  (1.803 m), weight 72.7 kg, SpO2 100 %.    Vent Mode: CPAP;PSV FiO2 (%):  [30 %-40 %] 40 % Set Rate:  [16 bmp] 16 bmp Vt Set:  [600 mL] 600 mL PEEP:  [5 cmH20] 5 cmH20 Pressure Support:  [12 cmH20] 12 cmH20 Plateau Pressure:  [12 cmH20-17 cmH20] 12 cmH20   Intake/Output Summary (Last 24 hours) at 07/12/2018 1445 Last data filed at 07/12/2018 0700 Gross per 24 hour  Intake 3597.37 ml  Output 2725 ml  Net 872.37 ml   Filed Weights   07/09/18 0500 07/11/18 0500 07/12/18 0500  Weight: 67.4 kg 67.6 kg 72.7 kg    Examination:   General -thin, elderly man, no acute distress Eyes - pupils small but reactive bilaterally ENT - ETT in place, no pallor or icterus Cardiac - regular rate/rhythm, no murmur Chest - decreased BS Abdomen - soft, non tender, + bowel sounds Extremities - no cyanosis, clubbing, or edema Skin - no rashes Neuro - RASS -4, unresponsive  X-ray 5/14 personally reviewed shows left lower lobe atelectasis/consolidation  Resolved Hospital Problem list   AKI   Assessment & Plan:   Status epilepticus likely due to large right MCA infarct/reperfusion hemorrhage ? alcohol withdrawal contributing P:  - continue AEDs -Keppra, lacosamide, valproate, Trileptal -LTM EEG continues per neurology -Versed was stopped 5/12 and propofol on  5/  13 but remains cephalopathic  Acute respiratory failure RLL infiltrate/ probable aspiration: completed 7 day course of Unasyn 5/11  P:  -Start spontaneous breathing trials but will not extubate until more awake  HTN HLD P:  - monitor hemodynamics  CAD s/p PCI, LV thrombus on coumadin,  PVD, ICM (EF 35-40% 2014) Echo this admission does not show LV  thrombus P:  - continue brilinta - no further anticoagulation at this time as he doesn't have LV thrombus anymore  Hyperglycemia P:  - SSI  C diff colitis-we will treat given fever, rising WBC and loose stools Vancomycin 125 every 6 hours for 10 days  Best practice:  Diet: NPO, TF Pain/Anxiety/Delirium protocol (if indicated): sedation needs based on EEG VAP protocol (if indicated): yes DVT prophylaxis: SCDs GI prophylaxis: PPI Glucose control: SSI sensitive Mobility: BR Code Status: Full  Family Communication: Daughter updated 5/12 Disposition: ICU  The patient is critically ill with multiple organ systems failure and requires high complexity decision making for assessment and support, frequent evaluation and titration of therapies, application of advanced monitoring technologies and extensive interpretation of multiple databases. Critical Care Time devoted to patient care services described in this note independent of APP/resident  time is 32 minutes.   Cyril Mourningakesh Myliah Medel MD. Tonny BollmanFCCP. [facility] Pager [phone] If no response call [phone]   07/12/2018      07/12/2018, 2:45 PM

## 2018-07-12 NOTE — Progress Notes (Signed)
Mentioned to eLink Dr that patient has recently ended a round of IV unasyn on 5/11, has not been on any stool softeners or laxatives, has had watery/mucous like/bloody/foul smelling stool for multiple days, had a temp max of 101.1 F over the night, and WBC count jumped from 13.8 to 20.7.  Order placed to test patient for cdiff.

## 2018-07-12 NOTE — Progress Notes (Signed)
EEG maintenance complete. No skin breakdown / continue to monitor 

## 2018-07-12 NOTE — Evaluation (Signed)
Occupational Therapy Evaluation Patient Details Name: Edward Cooper MRN: [id number] DOB: 27-Sep-1955 Today's Date: 07/12/2018    History of Present Illness Pt is a 63 y.o. M with significant PMH of CAD s/p PCI, PVD who presents with witnessed seizures x 3, requiring intubation for airway protection on 5/4. Imaging on 5/12 showing extensive right hemisphere infarct, right MCA territory both acute and subacute with areas of reperfusion hemorrhage affecting cortex and basal ganglia. Some extra axial hemorrhage on the right.    Clinical Impression   Pt admitted with above. He demonstrates the below listed deficits and will benefit from continued OT to maximize safety and independence with BADLs.  Pt was seen in conjunction with PT.  Pt is minimally responsive.  He will withdraw to pain bil. LEs, but no response to painful stimuli bil.  UEs.  He will flutter his eyes when moved into upright/chair position, and will blink to threat when eyes held open.  He does not fixate or track.  Mod spasticity noted bil. UEs - will monitor for splinting needs.  Currently he is unable to engage in ADL or functional tasks.  Unsure of PLOF as he is unable to provide info.  Will follow for a trial of OT to gauge his rehab potential.        Follow Up Recommendations  SNF;LTACH    Equipment Recommendations  None recommended by OT    Recommendations for Other Services       Precautions / Restrictions Precautions Precautions: Other (comment) Precaution Comments: rectal tube, cont EEG Restrictions Weight Bearing Restrictions: No      Mobility Bed Mobility Overal bed mobility: Needs Assistance             General bed mobility comments: Bed placed in Egress position. TotalA for repositioning  Transfers                      Balance                                           ADL either performed or assessed with clinical judgement   ADL Overall ADL's : Needs  assistance/impaired                                       General ADL Comments: Pt requires total A for all aspects of ADLs      Vision   Additional Comments: Pt keeps eyes closed.  He will blink to threat bil. when eyes held open.  does not track or fixate on object      Perception     Praxis      Pertinent Vitals/Pain Pain Assessment: Faces Faces Pain Scale: No hurt Pain Location: withdraws to pain stimuli on BLE's     Hand Dominance (unsure )   Extremity/Trunk Assessment Upper Extremity Assessment Upper Extremity Assessment: RUE deficits/detail;LUE deficits/detail RUE Deficits / Details: no active ROM noted.  PROM of shoulder to 90* with tighntess noted in internal rotation and decreased upward rotation of scapula .  Mod flexor spasticity noted  LUE Deficits / Details: no active ROM noted.  PROM of shoulder WFL   .  Mod flexor spasticity noted    Lower Extremity Assessment Lower Extremity Assessment: Defer to PT evaluation RLE Deficits / Details:  No active movement noted. Noxious to pain stimuli with knee flexion. PROM WFL LLE Deficits / Details: +Clonus. Noxious to pain stimuli with minimal knee flexion.   Cervical / Trunk Assessment Cervical / Trunk Assessment: Other exceptions Cervical / Trunk Exceptions: Poor head/neck control    Communication Communication Communication: Other (comment)(intubated)   Cognition Arousal/Alertness: Lethargic Behavior During Therapy: (asleep) Overall Cognitive Status: Difficult to assess                                 General Comments: Pt minimally responsive.  He has flexor withdraw to painful stimuli bil. LEs, no response to painful stimuli bil. UEs.  when eyes held open, he will blink to threat, but extinguishes this response quickly.  When he was moved into chair position, he fluttered his eyes.   No response to auditory stimulation    General Comments  VSS    Exercises     Shoulder  Instructions      Home Living Family/patient expects to be discharged to:: Unsure                                        Prior Functioning/Environment          Comments: Pt unable to provide PLOF        OT Problem List: Decreased strength;Decreased range of motion;Decreased activity tolerance;Impaired balance (sitting and/or standing);Impaired vision/perception;Decreased coordination;Decreased cognition;Decreased safety awareness;Decreased knowledge of use of DME or AE;Cardiopulmonary status limiting activity;Impaired tone;Impaired UE functional use      OT Treatment/Interventions: Self-care/ADL training;Neuromuscular education;DME and/or AE instruction;Therapeutic activities;Cognitive remediation/compensation;Visual/perceptual remediation/compensation;Patient/family education;Balance training    OT Goals(Current goals can be found in the care plan section) Acute Rehab OT Goals Patient Stated Goal: unable OT Goal Formulation: Patient unable to participate in goal setting Time For Goal Achievement: 07/26/18 Potential to Achieve Goals: Fair ADL Goals Additional ADL Goal #1: Pt will maintain eye opening x 75% of time during session Additional ADL Goal #2: Pt will track 25% of time  OT Frequency: Min 2X/week   Barriers to D/C: Decreased caregiver support  unsure family would be able to care for him        Co-evaluation PT/OT/SLP Co-Evaluation/Treatment: Yes Reason for Co-Treatment: Complexity of the patient's impairments (multi-system involvement);Necessary to address cognition/behavior during functional activity PT goals addressed during session: Strengthening/ROM        AM-PAC OT "6 Clicks" Daily Activity     Outcome Measure Help from another person eating meals?: Total Help from another person taking care of personal grooming?: Total Help from another person toileting, which includes using toliet, bedpan, or urinal?: Total Help from another person  bathing (including washing, rinsing, drying)?: Total Help from another person to put on and taking off regular upper body clothing?: Total Help from another person to put on and taking off regular lower body clothing?: Total 6 Click Score: 6   End of Session Equipment Utilized During Treatment: Oxygen  Activity Tolerance: Patient limited by lethargy Patient left: in bed;with call bell/phone within reach  OT Visit Diagnosis: Cognitive communication deficit (R41.841) Symptoms and signs involving cognitive functions: Cerebral infarction                Time: 1700-1749 OT Time Calculation (min): 21 min Charges:  OT General Charges $OT Visit: 1 Visit OT Evaluation $OT Eval High Complexity:  1 High  Jeani Hawking, OTR/L Acute Rehabilitation Services Pager [phone] Office [phone]   Jeani Hawking M 07/12/2018, 6:20 PM

## 2018-07-12 NOTE — Progress Notes (Signed)
Carotid artery duplex has been completed. Preliminary results can be found in CV Proc through chart review.   07/12/18 2:05 PM Olen Cordial RVT

## 2018-07-12 NOTE — Progress Notes (Signed)
SLP Cancellation Note  Patient Details Name: Edward Cooper MRN: [id number] DOB: August 02, 1955   Cancelled treatment:       Reason Eval/Treat Not Completed: Medical issues which prohibited therapy;Patient not medically ready(Pt remains on vent at this time. SLP will follow up.)  Loranda Mastel I. Vear Clock, MS, CCC-SLP Acute Rehabilitation Services Office number [phone] Pager [phone]  Scheryl Marten 07/12/2018, 7:45 AM

## 2018-07-12 NOTE — Evaluation (Signed)
Physical Therapy Evaluation Patient Details Name: Edward Cooper MRN: [id number] DOB: [birth year] Today's Date: 07/12/2018   History of Present Illness  Pt is a 63 y.o. M with significant PMH of CAD s/p PCI, PVD who presents with witnessed seizures x 3, requiring intubation for airway protection on 5/4. Imaging on 5/12 showing extensive right hemisphere infarct, right MCA territory both acute and subacute with areas of reperfusion hemorrhage affecting cortex and basal ganglia. Some extra axial hemorrhage on the right.   Clinical Impression  Pt admitted with above. Pt seen on vent and continuous EEG. Unable to arouse pt during evaluation with PT/OT. Pt not following simple commands, blinking to threat initially. Noxious right and left knee flexion (R > L) to pain stimuli. + clonus on left. Bed placed in chair position to promote upright. Will continue to follow acutely for PROM and for attempts to stimulate arousal.     Follow Up Recommendations LTACH    Equipment Recommendations  Other (comment)(defer)    Recommendations for Other Services       Precautions / Restrictions Precautions Precautions: Other (comment) Precaution Comments: rectal tube, cont EEG Restrictions Weight Bearing Restrictions: No      Mobility  Bed Mobility Overal bed mobility: Needs Assistance             General bed mobility comments: Bed placed in Egress position. TotalA for repositioning  Transfers                    Ambulation/Gait                Stairs            Wheelchair Mobility    Modified Rankin (Stroke Patients Only) Modified Rankin (Stroke Patients Only) Pre-Morbid Rankin Score: No symptoms Modified Rankin: Severe disability     Balance                                             Pertinent Vitals/Pain Pain Assessment: Faces Faces Pain Scale: No hurt Pain Location: withdrawing to pain stimuli on BLE's    Home Living Family/patient  expects to be discharged to:: Unsure                      Prior Function           Comments: Pt unable to provide PLOF     Hand Dominance        Extremity/Trunk Assessment   Upper Extremity Assessment Upper Extremity Assessment: Defer to OT evaluation    Lower Extremity Assessment Lower Extremity Assessment: LLE deficits/detail;RLE deficits/detail RLE Deficits / Details: No active movement noted. Noxious to pain stimuli with knee flexion. PROM WFL LLE Deficits / Details: +Clonus. Noxious to pain stimuli with minimal knee flexion.       Communication   Communication: Other (comment)(intubated)  Cognition Arousal/Alertness: Lethargic Behavior During Therapy: (asleep) Overall Cognitive Status: Difficult to assess                                 General Comments: Pt asleep, unable to arouse      General Comments      Exercises     Assessment/Plan    PT Assessment Patient needs continued PT services  PT Problem List Decreased strength;Decreased range of  motion;Decreased mobility;Decreased cognition;Impaired tone       PT Treatment Interventions Functional mobility training;Therapeutic activities;Therapeutic exercise;Balance training;Neuromuscular re-education;Patient/family education    PT Goals (Current goals can be found in the Care Plan section)  Acute Rehab PT Goals Patient Stated Goal: unable PT Goal Formulation: Patient unable to participate in goal setting Time For Goal Achievement: 07/26/18 Potential to Achieve Goals: Fair    Frequency Min 2X/week   Barriers to discharge        Co-evaluation PT/OT/SLP Co-Evaluation/Treatment: Yes Reason for Co-Treatment: Complexity of the patient's impairments (multi-system involvement);Necessary to address cognition/behavior during functional activity PT goals addressed during session: Strengthening/ROM         AM-PAC PT "6 Clicks" Mobility  Outcome Measure Help needed turning from  your back to your side while in a flat bed without using bedrails?: Total Help needed moving from lying on your back to sitting on the side of a flat bed without using bedrails?: Total Help needed moving to and from a bed to a chair (including a wheelchair)?: Total Help needed standing up from a chair using your arms (e.g., wheelchair or bedside chair)?: Total Help needed to walk in hospital room?: Total Help needed climbing 3-5 steps with a railing? : Total 6 Click Score: 6    End of Session   Activity Tolerance: Patient limited by lethargy Patient left: in bed;with call bell/phone within reach Nurse Communication: Mobility status PT Visit Diagnosis: Other abnormalities of gait and mobility (R26.89);Other symptoms and signs involving the nervous system (R29.898)    Time: 0156-1537 PT Time Calculation (min) (ACUTE ONLY): 20 min   Charges:   PT Evaluation $PT Eval High Complexity: 1 High          Laurina Bustle, [place], DPT Acute Rehabilitation Services Pager [phone] Office [phone]   Vanetta Mulders 07/12/2018, 5:25 PM

## 2018-07-12 NOTE — Procedures (Signed)
   [facility] __________________  Video EEG Monitoring Report    CPT/Type of Study: 95720; 12-26hr continuous EEG with video Recording dates: 07/11/18 @07 :30 to 07/12/18 @07 :30 Recording epoch: 10 Requesting provider: Amada Jupiter Interpreting physician: Wynelle Bourgeois, MD  Indications for Procedure: Seizures Primary neurological diagnosis: Seizure  History: This is a 63 year old patient, undergoing continuous EEG to evaluate for seizures.   EEG Details: Continuous video EEG was performed using standard setting per the guidelines of American Clinical Neurophysiology Society (ACNS). A minimum of 21 electrodes were placed on scalp according to the International 10-20 or 10-10 system. Supplemental electrodes were placed as needed. Single EKG electrode was also used to detect cardiac arrhythmia. Recording was performed at a sampling rate of at least 256 Hz. Patient's behavior was continuously recorded on video simultaneously with EEG. A minimum of 18 channels were used for data display. Each epoch of study was reviewed manually daily and as needed using standard digital review software allowing for montage reformatting, gain and filter changes on a display system of sufficient resolution to prevent aliasing. Computerized quantitative EEG analysis (such as compressed spectral array analysis, dipole analysis, trending, automated spike & seizure detection) was used as indicated.  There is a break in the recording from 13:53 to 16:52.  Description of EEG features: State of patient: Coma <--> Stupor   Background Activity Overall Amplitude: low amplitude (20-30 V), asymmetric  Predominant Frequency: There is no sustained PDR. Diffuse 5 - 6 Hz dominant activity is seen Superimposed Frequencies: continuous medium amplitude (20-30 V) monomorphic alpha and beta range rhythms, bilateral and more pronounced in the left hemisphere and intermittent higher amplitude  spindles Reactivity to stimulation: present Asymmetry: Yes - higher amplitude of all rhythms in the left hemisphere Breach rhythm: No  Sleep: No sleep is recorded.  Background abnormalities: Yes 1. Continuous slow, right>left, generalized  Periodic or rhythmic abnormalities: Yes 1. LRDA+S, left fronto-central  Maximum: F3 Amount: sharp waves ranging from 1 / 15 seconds to 2 / second. Progressively more frequent intermittent periods of non-evolving rhythmicity are noted.   Epileptiform discharges: Yes - as above Paroxysmal events or seizures: No Push button events: No  EKG: 70-80 bpm and regular   Impression: This continuous EEG is indicative of: 1. A focal epileptic irritative zone in the LEFT fronto-central region. There are progressively more frequent discharges and intermittent non-evolving rhythmicity which lies on the ictal-interictal continuum.   2. An additional structural abnormality in the RIGHT hemisphere may account for the amplitude asymmetry.  3. An improving, now moderate to severe diffuse encephalopathy, but non-specific as to etiology. A contribution from sedating medication effect is suspected.  No seizure is recorded since 5/11 @08 :40.  Recommendations: Continue VEEG to ensure the absence of subclinical seizures and guide management.

## 2018-07-12 NOTE — Progress Notes (Signed)
Subjective: No significant changes.  Exam: Vitals:   07/12/18 0800 07/12/18 1200  BP: 121/80   Pulse: 92   Resp: 20   Temp: 98.1 F (36.7 C) 98.2 F (36.8 C)  SpO2: 100%    Gen: In bed, intubated Resp: Ventilated Abd: soft, nt  Neuro:  Pupils are reactive bilaterally, he has flexion to noxious stimulation of the right arm or leg, extension to noxious stimulation in the left arm and minimal flexion of the left leg  Pertinent Labs: Depakote level 31  Impression: 63 year old male with a history of seizures, but not on antiepileptic therapy, admitted on May 4 with status epilepticus to [facility]  Initially, LTM recorded multiple electrographic seizures with interictal epileptiform discharges on the left.    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  I suspect that his seizures were precipitated by his infarct.  With weaning, he has not had any further seizures but continues to have an extremely irritable area on the left. Recommendations: 1) Trileptal 300 mg twice daily 2) Daily Depakote levels 3) increase VPA to 400 mg every 4 hours  4) continue Keppra 1.5 g twice daily  5) continue lacosamide 200mg  BID 6) Na now > 155, will stop hypertonic.   This patient is critically ill and at significant risk of neurological worsening, death and care requires constant monitoring of vital signs, hemodynamics,respiratory and cardiac monitoring, neurological assessment, discussion with family, other specialists and medical decision making of high complexity. I spent 40 minutes of neurocritical care time  in the care of  this patient. This was time spent independent of any time provided by nurse practitioner or PA.  Ritta Slot, MD [facility] [phone]  If 7pm- 7am, please page neurology on call as listed in AMION. 07/12/2018  1:58 PM

## 2018-07-12 NOTE — Progress Notes (Signed)
eLink Physician-Brief Progress Note Patient Name: Edward Cooper DOB: [birth year] MRN: [id number]   Date of Service  07/12/2018  HPI/Events of Note  Pt with loose foul smelling stools, fever and leukocytosis  eICU Interventions  C-Diff screen ordered        Migdalia Dk 07/12/2018, 5:01 AM

## 2018-07-13 DIAGNOSIS — I63511 Cerebral infarction due to unspecified occlusion or stenosis of right middle cerebral artery: Principal | ICD-10-CM

## 2018-07-13 LAB — PHOSPHORUS: Phosphorus: 3.4 mg/dL (ref 2.5–4.6)

## 2018-07-13 LAB — BASIC METABOLIC PANEL
Anion gap: 6 (ref 5–15)
BUN: 18 mg/dL (ref 8–23)
CO2: 24 mmol/L (ref 22–32)
Calcium: 8 mg/dL — ABNORMAL LOW (ref 8.9–10.3)
Chloride: 124 mmol/L — ABNORMAL HIGH (ref 98–111)
Creatinine, Ser: 0.88 mg/dL (ref 0.61–1.24)
GFR calc Af Amer: >60 mL/min (ref >60)
GFR calc non Af Amer: >60 mL/min (ref >60)
Glucose, Bld: 156 mg/dL — ABNORMAL HIGH (ref 70–99)
Potassium: 3 mmol/L — ABNORMAL LOW (ref 3.5–5.1)
Sodium: 154 mmol/L — ABNORMAL HIGH (ref 135–145)

## 2018-07-13 LAB — GLUCOSE, CAPILLARY
Glucose-Capillary: 120 mg/dL — ABNORMAL HIGH (ref 70–99)
Glucose-Capillary: 138 mg/dL — ABNORMAL HIGH (ref 70–99)
Glucose-Capillary: 100 mg/dL — ABNORMAL HIGH (ref 70–99)
Glucose-Capillary: 105 mg/dL — ABNORMAL HIGH (ref 70–99)
Glucose-Capillary: 110 mg/dL — ABNORMAL HIGH (ref 70–99)
Glucose-Capillary: 109 mg/dL — ABNORMAL HIGH (ref 70–99)

## 2018-07-13 LAB — CBC
HCT: 34 % — ABNORMAL LOW (ref 39.0–52.0)
Hemoglobin: 10.6 g/dL — ABNORMAL LOW (ref 13.0–17.0)
MCH: 30.3 pg (ref 26.0–34.0)
MCHC: 31.2 g/dL (ref 30.0–36.0)
MCV: 97.1 fL (ref 80.0–100.0)
Platelets: 369 10*3/uL (ref 150–400)
RBC: 3.5 MIL/uL — ABNORMAL LOW (ref 4.22–5.81)
RDW: 16.8 % — ABNORMAL HIGH (ref 11.5–15.5)
WBC: 20.5 10*3/uL — ABNORMAL HIGH (ref 4.0–10.5)
nRBC: 0 % (ref 0.0–0.2)

## 2018-07-13 LAB — MAGNESIUM: Magnesium: 2.4 mg/dL (ref 1.7–2.4)

## 2018-07-13 LAB — VALPROIC ACID LEVEL: Valproic Acid Lvl: 31 ug/mL — ABNORMAL LOW (ref 50.0–100.0)

## 2018-07-13 LAB — SODIUM: Sodium: 156 mmol/L — ABNORMAL HIGH (ref 135–145)

## 2018-07-13 MED ORDER — VALPROATE SODIUM 500 MG/5ML IV SOLN
1000.0000 mg | Freq: Once | INTRAVENOUS | Status: AC
  Administered 2018-07-13: 1000 mg via INTRAVENOUS
  Filled 2018-07-13: qty 10

## 2018-07-13 MED ORDER — POTASSIUM CHLORIDE 10 MEQ/50ML IV SOLN
10.0000 meq | INTRAVENOUS | Status: AC
  Administered 2018-07-13 (×4): 10 meq via INTRAVENOUS
  Filled 2018-07-13 (×4): qty 50

## 2018-07-13 MED ORDER — VALPROATE SODIUM 500 MG/5ML IV SOLN
500.0000 mg | INTRAVENOUS | Status: DC
  Administered 2018-07-13 – 2018-07-14 (×7): 500 mg via INTRAVENOUS
  Filled 2018-07-13 (×12): qty 5

## 2018-07-13 NOTE — Progress Notes (Signed)
SLP Cancellation Note  Patient Details Name: Edward Cooper MRN: [id number] DOB: Nov 12, 1955   Cancelled treatment:       Reason Eval/Treat Not Completed: Medical issues which prohibited therapy;Patient not medically ready(Pt remains intubated at this time. SLP will follow up. )  Sawsan Riggio I. Vear Clock, MS, CCC-SLP Acute Rehabilitation Services Office number [phone] Pager [phone]  Scheryl Marten 07/13/2018, 7:51 AM

## 2018-07-13 NOTE — Progress Notes (Signed)
NAME:  Edward Cooper, MRN:  [id number], DOB:  August 10, 1955, LOS: 11 ADMISSION DATE:  07/05/18, CONSULTATION DATE:  Jul 05, 2018 REFERRING MD:  [facility], CHIEF COMPLAINT:  seizures  Brief History   63 yoM presenting with witnessed seizures x 3, did not return to baseline and required intubation for airway protection.  Transferred from [facility] to [facility] for continuous EEG.    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  5/12 weaning versed and up-titrating propofol in hopes of burst suppression.   Consults:  Neurology   Procedures:  5/4 ETT >> 5/13 Rt PICC >>   Significant Diagnostic Tests:  5/4 [facility] >> chronic changes, no acute abnormality 5/5 EEG >>abnormal, intermittent left hemispheric sharp waves.   LTM EEG 5/5 >> multiple seizures arising from left paracentral frontocentral and central parietal cortex. 5/7 left PLEDs 5/10 EEG Left frontocentral central parietal poorly formed sharply still present suggestive of some degree of cortical irritability in that region.   5/11 EEG  Occasionally left frontocentral sharp waves and spikes present suggestive of some degree of cortical irritability. No clinical or subclinical seizures present throughout the recording.  5/12 MRI >> Extensive RIGHT hemisphere infarct, RIGHT MCA territory, both acute and subacute, with areas of reperfusion hemorrhage affecting not only cortex but the basal ganglia.Some extra-axial hemorrhage on the RIGHT  Micro Data:  5/4 MRSA PCR  >>neg 5/5 respiratory >> neg 5/14 C diff Ag POS, PCR positive  Antimicrobials:  5/5 unasyn > 5/11 5/14  vancomycin oral >>  Interim history/subjective:   Low-grade febrile Remains critically ill, unresponsive, intubated  Objective   Blood pressure (!) 150/78, pulse (!) 102, temperature 99.4 F (37.4 C), temperature source Axillary, resp. rate (!) 21, height 5\' 11"  (1.803 m), weight 72.6 kg, SpO2 100 %.    Vent Mode: CPAP;PSV FiO2 (%):  [40 %] 40 % Set Rate:  [16 bmp] 16 bmp Vt Set:  [600 mL] 600 mL PEEP:  [5 cmH20] 5 cmH20 Pressure Support:  [10 cmH20-12 cmH20] 10 cmH20 Plateau Pressure:  [10 cmH20-16 cmH20] 16 cmH20   Intake/Output Summary (Last 24 hours) at 07/13/2018 0919 Last data filed at 07/13/2018 0700 Gross per 24 hour  Intake 2162.49 ml  Output 1425 ml  Net 737.49 ml   Filed Weights   07/11/18 0500 07/12/18 0500 07/13/18 0500  Weight: 67.6 kg 72.7 kg 72.6 kg    Examination:   General -thin, elderly man, no acute distress Eyes - pupils small but reactive bilaterally ENT - ETT in place, no pallor or icterus Cardiac - regular rate/rhythm, no murmur Chest - decreased BS Abdomen - soft, non tender, + bowel sounds, liquid stool  extremities - no cyanosis, clubbing, or edema Skin - no rashes Neuro - RASS -4, unresponsive  Chest x-ray 5/14 left lower lobe atelectasis   Resolved Hospital Problem list   AKI   Assessment & Plan:   Status epilepticus likely due to large right MCA infarct/reperfusion hemorrhage ? alcohol withdrawal contributing P:  - continue AEDs -Keppra, lacosamide, valproate, Trileptal -LTM EEG  per neurology-seizures appear to have stopped -Versed was stopped 5/12 and  propofol on 5/ 13 but remains encephalopathic due to residual sedation  Acute respiratory failure RLL infiltrate/ probable aspiration: completed 7 day course of Unasyn 5/11 P:  -Start spontaneous breathing trials but will not extubate until more awake -We will discuss goals of care including tracheostomy if does not wake up over the weekend  HTN HLD P:  - monitor  hemodynamics  CAD s/p PCI, LV thrombus on coumadin,  PVD, ICM (EF 35-40% 2014) Echo this admission does not show LV thrombus P:  - continue brilinta - no further anticoagulation at this time as he doesn't have LV thrombus anymore  Hyperglycemia P:  - SSI  Fever, leukocytosis C diff colitis- Ct oral Vancomycin 125 every 6 hours for 10 days Repeat respiratory culture and if persistent fever then consider treating left lower lobe H CAP   Hypokalemia will be repleted Hypernatremia residual from 3% saline Best practice:  Diet: NPO, TF Pain/Anxiety/Delirium protocol (if indicated): sedation needs based on EEG VAP protocol (if indicated): yes DVT prophylaxis: SCDs GI prophylaxis: PPI Glucose control: SSI sensitive Mobility: BR Code Status: Full  Family Communication: Daughter updated by neurology Disposition: ICU  Summary-seizures appear to have resolved but he remains encephalopathic due to residual sedation, also unclear what deficits he will be left with due to large right MCA CVA, If he does not wake up for the weekend then will need goals of care discussion including tracheostomy for next week  The patient is critically ill with multiple organ systems failure and requires high complexity decision making for assessment and support, frequent evaluation and titration of therapies, application of advanced monitoring technologies and extensive interpretation of multiple databases. Critical Care Time devoted to patient care services described in this note independent of APP/resident  time is 32 minutes.     Cyril Mourningakesh Jamorian Dimaria MD. Tonny BollmanFCCP. [facility] Pager [phone] If no response call [phone]     07/13/2018, 9:19 AM

## 2018-07-13 NOTE — Progress Notes (Signed)
eLink Physician-Brief Progress Note Patient Name: Edward Cooper DOB: [birth year] MRN: [id number]   Date of Service  07/13/2018  HPI/Events of Note  K+ 3.0  eICU Interventions  KCL 10 meq iv hourly x 4 hours        [name] 07/13/2018, 6:42 AM

## 2018-07-13 NOTE — Progress Notes (Signed)
EEG maint complete. No skin breakdown/ continue to monitor °

## 2018-07-13 NOTE — Progress Notes (Signed)
Subjective: No significant changes.  Exam: Vitals:   07/13/18 1000 07/13/18 1116  BP: 136/77   Pulse: 100   Resp: 18   Temp:  99.7 F (37.6 C)  SpO2: 100%    Gen: In bed, intubated Resp: Ventilated Abd: soft, nt  Neuro:  Pupils are reactive bilaterally, he has flexion to noxious stimulation of the right arm and leg, extension to noxious stimulation in the left arm and minimal flexion of the left leg  Pertinent Labs: Depakote level 31  Impression: 63 year old male with a history of seizures, but not on antiepileptic therapy, admitted on May 4 with status epilepticus to [facility].  Initially, LTM recorded multiple electrographic seizures with interictal epileptiform discharges on the left.    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  I suspect that his seizures were precipitated by his infarct.  With weaning, he has not had any further seizures but continues to have an extremely irritable area on the left, though there does seem to be some improvement tenderness today.  Recommendations: 1) Trileptal 300 mg twice daily 2) Daily Depakote levels 3) increase VPA to 500 mg every 4 hours  4) continue Keppra 1.5 g twice daily  5) continue lacosamide 200mg  BID   This patient is critically ill and at significant risk of neurological worsening, death and care requires constant monitoring of vital signs, hemodynamics,respiratory and cardiac monitoring, neurological assessment, discussion with family, other specialists and medical decision making of high complexity. I spent 40 minutes of neurocritical care time  in the care of  this patient. This was time spent independent of any time provided by nurse practitioner or PA.  Ritta Slot, MD [facility] [phone]  If 7pm- 7am, please page neurology on call as listed in AMION. 07/13/2018  1:25 PM

## 2018-07-13 NOTE — Procedures (Signed)
   [facility] __________________  Video EEG Monitoring Report    CPT/Type of Study: 95720; 12-26hr continuous EEG with video Recording dates: 07/12/18 @07 :30 to 07/13/18 @07 :30 Recording epoch: 11 Requesting provider: Amada Jupiter Interpreting physician: Wynelle Bourgeois, MD  Indications for Procedure: Seizures Primary neurological diagnosis: Seizure  History: This is a 63 year old patient, undergoing continuous EEG to evaluate for seizures.   EEG Details: Continuous video EEG was performed using standard setting per the guidelines of American Clinical Neurophysiology Society (ACNS). A minimum of 21 electrodes were placed on scalp according to the International 10-20 or 10-10 system. Supplemental electrodes were placed as needed. Single EKG electrode was also used to detect cardiac arrhythmia. Recording was performed at a sampling rate of at least 256 Hz. Patient's behavior was continuously recorded on video simultaneously with EEG. A minimum of 18 channels were used for data display. Each epoch of study was reviewed manually daily and as needed using standard digital review software allowing for montage reformatting, gain and filter changes on a display system of sufficient resolution to prevent aliasing. Computerized quantitative EEG analysis (such as compressed spectral array analysis, dipole analysis, trending, automated spike & seizure detection) was used as indicated.  There is a break in the recording from 13:53 to 16:52.  Description of EEG features: State of patient: Coma <--> Stupor   Background Activity Overall Amplitude: medium amplitude (20-50 V), asymmetric  Predominant Frequency: A posterior dominant rhythm of 8 Hz is observed on the LEFT, less well formed and reaching 6-7 Hz on the right Superimposed Frequencies: continuous medium amplitude (20-50 V) polymorphic delta>theta rhythms, bilateral and more pronounced in the right hemisphere and  left fronto-central (F3) Reactivity to stimulation: present Asymmetry: Yes - decreased amplitude, RIGHT hemisphere, temporal>parasagittal  Breach rhythm: No  Sleep: No sleep is recorded.  Background abnormalities: Yes 1. Continuous slow, RIGHT>left, generalized  2. Continuous slow, focal LEFT frontal-central  Periodic or rhythmic abnormalities: Yes 1. Sharp waves <--> LRDA+S, left fronto-central Maximum: F3 Amount: isolated and in bursts of rhythmic delta with sharp waves, decreasing in frequency throughout the recording  Epileptiform discharges: Yes Paroxysmal events or seizures: No Push button events: No  EKG: 100 bpm and regular    Impression: This continuous EEG is indicative of: 1. A focal epileptic irritative zone in the LEFT fronto-central region. There are progressively fewer discharges and intermittent non-evolving rhythmic activity (LRDA+S).   2. An additional broad structural abnormality in the RIGHT hemisphere.  3. An improving, now moderate diffuse encephalopathy, but non-specific as to etiology.   No seizure is recorded since 5/11 @08 :40.  Recommendations: Continue VEEG to ensure the absence of subclinical seizures and guide management.

## 2018-07-14 LAB — COMPREHENSIVE METABOLIC PANEL
ALT: 39 U/L (ref 0–44)
AST: 58 U/L — ABNORMAL HIGH (ref 15–41)
Albumin: 1.5 g/dL — ABNORMAL LOW (ref 3.5–5.0)
Alkaline Phosphatase: 48 U/L (ref 38–126)
Anion gap: 6 (ref 5–15)
BUN: 16 mg/dL (ref 8–23)
CO2: 27 mmol/L (ref 22–32)
Calcium: 8.1 mg/dL — ABNORMAL LOW (ref 8.9–10.3)
Chloride: 117 mmol/L — ABNORMAL HIGH (ref 98–111)
Creatinine, Ser: 0.73 mg/dL (ref 0.61–1.24)
GFR calc Af Amer: >60 mL/min (ref >60)
GFR calc non Af Amer: >60 mL/min (ref >60)
Glucose, Bld: 178 mg/dL — ABNORMAL HIGH (ref 70–99)
Potassium: 3.5 mmol/L (ref 3.5–5.1)
Sodium: 150 mmol/L — ABNORMAL HIGH (ref 135–145)
Total Bilirubin: 0.3 mg/dL (ref 0.3–1.2)
Total Protein: 5.4 g/dL — ABNORMAL LOW (ref 6.5–8.1)

## 2018-07-14 LAB — GLUCOSE, CAPILLARY
Glucose-Capillary: 102 mg/dL — ABNORMAL HIGH (ref 70–99)
Glucose-Capillary: 108 mg/dL — ABNORMAL HIGH (ref 70–99)
Glucose-Capillary: 113 mg/dL — ABNORMAL HIGH (ref 70–99)
Glucose-Capillary: 113 mg/dL — ABNORMAL HIGH (ref 70–99)
Glucose-Capillary: 113 mg/dL — ABNORMAL HIGH (ref 70–99)
Glucose-Capillary: 99 mg/dL (ref 70–99)

## 2018-07-14 LAB — VALPROIC ACID LEVEL: Valproic Acid Lvl: 34 ug/mL — ABNORMAL LOW (ref 50.0–100.0)

## 2018-07-14 MED ORDER — VANCOMYCIN HCL 10 G IV SOLR
1250.0000 mg | Freq: Two times a day (BID) | INTRAVENOUS | Status: DC
  Administered 2018-07-15 – 2018-07-16 (×3): 1250 mg via INTRAVENOUS
  Filled 2018-07-14 (×4): qty 1250

## 2018-07-14 MED ORDER — VALPROATE SODIUM 500 MG/5ML IV SOLN
600.0000 mg | INTRAVENOUS | Status: DC
  Administered 2018-07-14 – 2018-07-15 (×5): 600 mg via INTRAVENOUS
  Filled 2018-07-14 (×2): qty 6
  Filled 2018-07-14: qty 5
  Filled 2018-07-14 (×5): qty 6

## 2018-07-14 MED ORDER — POTASSIUM CHLORIDE 20 MEQ/15ML (10%) PO SOLN
40.0000 meq | Freq: Once | ORAL | Status: AC
  Administered 2018-07-14: 40 meq
  Filled 2018-07-14: qty 30

## 2018-07-14 MED ORDER — FREE WATER
200.0000 mL | Freq: Three times a day (TID) | Status: DC
  Administered 2018-07-14 – 2018-07-24 (×29): 200 mL

## 2018-07-14 MED ORDER — SODIUM CHLORIDE 0.9 % IV SOLN
2.0000 g | Freq: Three times a day (TID) | INTRAVENOUS | Status: DC
  Administered 2018-07-14 – 2018-07-16 (×6): 2 g via INTRAVENOUS
  Filled 2018-07-14 (×8): qty 2

## 2018-07-14 MED ORDER — VANCOMYCIN HCL 10 G IV SOLR
2000.0000 mg | Freq: Once | INTRAVENOUS | Status: AC
  Administered 2018-07-14: 2000 mg via INTRAVENOUS
  Filled 2018-07-14: qty 2000

## 2018-07-14 NOTE — Progress Notes (Signed)
SLP Cancellation Note  Patient Details Name: Edward Cooper MRN: [id number] DOB: 1955-10-18   Cancelled treatment:       Reason Eval/Treat Not Completed: Patient not medically ready. Patient remains intubated on vent; unresponsive per recent notes. Will check on status next date with plan to s/o on SLP orders if no change.    Elio Forget Tarrell 07/14/2018, 11:33 AM   Angela Nevin, MA, CCC-SLP Speech Therapy [facility] Acute [facility] Pager: [phone]

## 2018-07-14 NOTE — Progress Notes (Signed)
Pt noted to be having continuous hippus of the L pupil, MD notified

## 2018-07-14 NOTE — Progress Notes (Signed)
Subjective: No significant changes.  Exam: Vitals:   07/14/18 1300 07/14/18 1400  BP: 135/76 (!) 163/89  Pulse: 98 (!) 102  Resp: (!) 21 (!) 25  Temp:    SpO2: 96% 99%   Gen: In bed, intubated Resp: Ventilated Abd: soft, nt  Neuro:  Pupils are reactive bilaterally, he has flexion to noxious stimulation of the right arm and leg, extension to noxious stimulation in the left arm and minimal flexion of the left leg  Pertinent Labs: Depakote level 34  Impression: 63 year old male with a history of seizures, but not on antiepileptic therapy, admitted on May 4 with status epilepticus to [facility].  Initially, LTM recorded multiple electrographic seizures with interictal epileptiform discharges on the left.    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  I suspect that his seizures were precipitated by his infarct.  With weaning, he has not had any further seizures but continues to have an extremely irritable area on the left, though there does seem to be some improvement in this.  Recommendations: 1) Trileptal 600 mg twice daily 2) Daily Depakote levels 3) increase VPA to 600 mg every 4 hours  4) continue Keppra 1.5 g twice daily  5) continue lacosamide 200mg  BID 6) can discontinue EEG monitoring  This patient is critically ill and at significant risk of neurological worsening, death and care requires constant monitoring of vital signs, hemodynamics,respiratory and cardiac monitoring, neurological assessment, discussion with family, other specialists and medical decision making of high complexity. I spent 40 minutes of neurocritical care time  in the care of  this patient. This was time spent independent of any time provided by nurse practitioner or PA.  Ritta Slot, MD [facility] [phone]  If 7pm- 7am, please page neurology on call as listed in AMION. 07/14/2018  3:03 PM

## 2018-07-14 NOTE — Progress Notes (Signed)
L FA PIV removed due to redness

## 2018-07-14 NOTE — Progress Notes (Signed)
NAME:  Edward NashJeffrey D Finkler, MRN:  [id number], DOB:  Dec 18, 1955, LOS: 12 ADMISSION DATE:  07/23/2018, CONSULTATION DATE:  07/23/2018 REFERRING MD:  [facility], CHIEF COMPLAINT:  seizures  Brief History   63 yoM presenting with witnessed seizures x 3, did not return to baseline and required intubation for airway protection.  [facility]    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  5/12 weaning versed and up-titrating propofol in hopes of burst suppression.  5/16: No further seizures.  Remains comatose however tolerating pressure support ventilation, starting antibiotics for temperature and left lower lobe pneumonia Consults:  Neurology   Procedures:  5/4 ETT >> 5/13 Rt PICC >>   Significant Diagnostic Tests:  [facility] chronic changes, no acute abnormality 5/5 EEG >>abnormal, intermittent left hemispheric sharp waves.   LTM EEG 5/5 >> multiple seizures arising from left paracentral frontocentral and central parietal cortex. 5/7 left PLEDs 5/10 EEG Left frontocentral central parietal poorly formed sharply still present suggestive of some degree of cortical irritability in that region.   5/11 EEG  Occasionally left frontocentral sharp waves and spikes present suggestive of some degree of cortical irritability. No clinical or subclinical seizures present throughout the recording.  5/12 MRI >> Extensive RIGHT hemisphere infarct, RIGHT MCA territory, both acute and subacute, with areas of reperfusion hemorrhage affecting not only cortex but the basal ganglia.Some extra-axial  hemorrhage on the RIGHT  Micro Data:  5/4 MRSA PCR  >>neg 5/5 respiratory >> neg 5/14 C diff Ag POS, PCR positive  Antimicrobials:  5/5 unasyn > 5/11 5/14 vancomycin oral >> 5/16: Vancomycin>>> 5/16: Cefepime>>> Interim history/subjective:   Remains unresponsive, maximum temperature 101  Objective   Blood pressure (Abnormal) 149/78, pulse (Abnormal) 107, temperature (Abnormal) 101 F (38.3 C), temperature source Axillary, resp. rate 19, height 5\' 11"  (1.803 m), weight 83.9 kg, SpO2 99 %.    Vent Mode: PSV;CPAP FiO2 (%):  [40 %] 40 % Set Rate:  [16 bmp] 16 bmp Vt Set:  [600 mL] 600 mL PEEP:  [5 cmH20] 5 cmH20 Pressure Support:  [8 cmH20] 8 cmH20 Plateau Pressure:  [16 cmH20-18 cmH20] 16 cmH20   Intake/Output Summary (Last 24 hours) at 07/14/2018 1008 Last data filed at 07/14/2018 0900 Gross per 24 hour  Intake 1816.39 ml  Output 1400 ml  Net 416.39 ml   Filed Weights   07/12/18 0500 07/13/18 0500 07/14/18 0428  Weight: 72.7 kg 72.6 kg 83.9 kg    Examination:   General: 63 year old male patient unresponsive on mechanical ventilation HEENT normocephalic atraumatic orally intubated Pulmonary: Clear to auscultation equal chest rise on mechanically assisted breath Cardiac: Regular rate and rhythm without murmur gallop Abdomen: Soft nontender no organomegaly Extremities: Dependent edema strong pulses Neuro: Comatose, eyes slowly open to deep noxious stimulus GU: Clear yellow  Resolved Hospital Problem list   AKI  Aspiration PNA w/ RLL infiltrate (completed 7 d rx)  Assessment & Plan:   Status epilepticus likely due to large right MCA infarct/reperfusion hemorrhage ? alcohol withdrawal contributing Plan Cont current AEDs: keppra, lacosamide, valproic acid and  trilepta Cont supportive care Serial neuro checks Treat fever Correct water deficit  Acute respiratory failure w/ ventilator dependence d/t on-going encephalopathy Possible left lower lobe hcap Plan  Continue pressure support ventilation but not ready for extubation given mental status Repeat chest x-ray in a.m. 5/17 Obtained respiratory culture Initiate empiric H CAP coverage VAP bundle  HTN and HLD Plan Cont to monitor BP Cont statin  CAD s/p PCI, LV thrombus on coumadin,  PVD, ICM (EF 35-40% 2014) Echo this admission does not show LV thrombus Plan Cont brillinta  Cont tele    Fever, leukocytosis C diff colitis- -sputum consistent with normal flora Plan Continuing oral vancomycin for 10 days total after discontinuing antibiotics  Fluid and electrolyte imbalance: iatrogenic hypernatremia, hypokalemia Plan Repeat chemistry today Allowing Na to trend down May need free water replacement now that 3% protocol stopped  Hyperglycemia Plan ssi   Best practice:  Diet: NPO, TF Pain/Anxiety/Delirium protocol (if indicated): sedation needs based on EEG VAP protocol (if indicated): yes DVT prophylaxis: SCDs GI prophylaxis: PPI Glucose control: SSI sensitive Mobility: BR Code Status: Full  Family Communication: Daughter updated by neurology Disposition: ICU Remains critically ill.  Still febrile.  Has left lower lobe infiltrate worrisome for VAP/H CAP.  Remains encephalopathic for several reasons for this residual medications, stroke, water imbalance and also suspect fever contributing will need ongoing supportive care is not ready for extubation  Simonne Martinet ACNP-BC [facility] Pulmonary/Critical Care Pager # [phone] OR # [phone] if no answer     07/14/2018, 10:08 AM

## 2018-07-14 NOTE — Progress Notes (Signed)
Pharmacy Antibiotic Note  Edward Cooper is a 63 y.o. male admitted on 07/20/2018 with pneumonia.    Plan: Add vanc 2 g x 1 then 1250 q12 Add cefepime 2 g q8h Monitor renal fx cx vanc lvls prn  Height: 5\' 11"  (180.3 cm) Weight: 184 lb 15.5 oz (83.9 kg) IBW/kg (Calculated) : 75.3  Temp (24hrs), Avg:99.6 F (37.6 C), Min:98.1 F (36.7 C), Max:101.4 F (38.6 C)  Recent Labs  Lab 07/09/18 0445 07/10/18 0205 07/10/18 0600 07/10/18 1953 07/11/18 0438 07/12/18 0421 07/13/18 0449  WBC 12.5* 11.2*  --   --  13.8* 20.7* 20.5*  CREATININE  --  0.71 0.79 0.74 0.80 0.78 0.88    Estimated Creatinine Clearance: 93.9 mL/min (by C-G formula based on SCr of 0.88 mg/dL).    No Known Allergies  Vancomycin 1250 mg IV Q 12 hrs. Goal AUC 400-550. Expected AUC: 502 SCr used: 0.88  Edward Cooper, PharmD, BCPS, BCCCP Clinical Pharmacist [phone]  Please check AMION for all [facility] numbers  07/14/2018 10:39 AM

## 2018-07-14 NOTE — Procedures (Signed)
LTM-EEG Report  HISTORY: Continuous video-EEG monitoring performed for 63 year old with stroke, status epilepticus.   ACQUISITION: International 10-20 system for electrode placement; 18 channels with additional eyes linked to ipsilateral ears and EKG. Additional T1-T2 electrodes were used. Continuous video recording obtained.   EEG NUMBER:  MEDICATIONS:  Day 12: see EMR   DAY #12: from 0730 07/13/18 to 0730 07/14/18   BACKGROUND: An overall medium voltage continuous recording with poor spontaneous variability and reactivity. The waking background consisted of medium voltage theta-delta on the left and featureless slowing on the right. Emergence of a 7Hz  posterior dominant rhythm on the left was seen overnight. Sleep was captured with normal stage II sleep architecture on the left.  EPILEPTIFORM/PERIODIC ACTIVITY: Occasional brief runs of rhythmic 3-4Hz  activity was seen on the left in waking with rare intermixed F3 sharp waves. There was no discrete ictal evolution to this pattern. It was slightly improved overnight as the background improved. SEIZURES: no discrete seizures seen EVENTS: none reported  EKG: no significant arrhythmia  SUMMARY: This was a moderately abnormal continuous video EEG due to asymmetric background slowing with loss of detail over the right. Ongoing brief runs of left anterior rhythmic slowing was seen but remained improved overnight with some improved waking background on that side. No discrete seizures were seen.

## 2018-07-14 NOTE — Progress Notes (Signed)
none

## 2018-07-14 NOTE — Progress Notes (Signed)
EEG LTM complete. No further skin breakdown

## 2018-07-15 ENCOUNTER — Inpatient Hospital Stay (HOSPITAL_COMMUNITY): Payer: Medicare Other

## 2018-07-15 LAB — CBC
HCT: 34.3 % — ABNORMAL LOW (ref 39.0–52.0)
Hemoglobin: 10.8 g/dL — ABNORMAL LOW (ref 13.0–17.0)
MCH: 30.2 pg (ref 26.0–34.0)
MCHC: 31.5 g/dL (ref 30.0–36.0)
MCV: 95.8 fL (ref 80.0–100.0)
Platelets: 386 10*3/uL (ref 150–400)
RBC: 3.58 MIL/uL — ABNORMAL LOW (ref 4.22–5.81)
RDW: 15.8 % — ABNORMAL HIGH (ref 11.5–15.5)
WBC: 17.5 10*3/uL — ABNORMAL HIGH (ref 4.0–10.5)
nRBC: 0 % (ref 0.0–0.2)

## 2018-07-15 LAB — COMPREHENSIVE METABOLIC PANEL
ALT: 39 U/L (ref 0–44)
AST: 54 U/L — ABNORMAL HIGH (ref 15–41)
Albumin: 1.7 g/dL — ABNORMAL LOW (ref 3.5–5.0)
Alkaline Phosphatase: 54 U/L (ref 38–126)
Anion gap: 9 (ref 5–15)
BUN: 15 mg/dL (ref 8–23)
CO2: 26 mmol/L (ref 22–32)
Calcium: 8.4 mg/dL — ABNORMAL LOW (ref 8.9–10.3)
Chloride: 112 mmol/L — ABNORMAL HIGH (ref 98–111)
Creatinine, Ser: 0.69 mg/dL (ref 0.61–1.24)
GFR calc Af Amer: >60 mL/min (ref >60)
GFR calc non Af Amer: >60 mL/min (ref >60)
Glucose, Bld: 111 mg/dL — ABNORMAL HIGH (ref 70–99)
Potassium: 3.8 mmol/L (ref 3.5–5.1)
Sodium: 147 mmol/L — ABNORMAL HIGH (ref 135–145)
Total Bilirubin: 0.5 mg/dL (ref 0.3–1.2)
Total Protein: 5.9 g/dL — ABNORMAL LOW (ref 6.5–8.1)

## 2018-07-15 LAB — GLUCOSE, CAPILLARY
Glucose-Capillary: 103 mg/dL — ABNORMAL HIGH (ref 70–99)
Glucose-Capillary: 103 mg/dL — ABNORMAL HIGH (ref 70–99)
Glucose-Capillary: 109 mg/dL — ABNORMAL HIGH (ref 70–99)
Glucose-Capillary: 67 mg/dL — ABNORMAL LOW (ref 70–99)
Glucose-Capillary: 70 mg/dL (ref 70–99)
Glucose-Capillary: 85 mg/dL (ref 70–99)
Glucose-Capillary: 115 mg/dL — ABNORMAL HIGH (ref 70–99)

## 2018-07-15 LAB — VALPROIC ACID LEVEL: Valproic Acid Lvl: 37 ug/mL — ABNORMAL LOW (ref 50.0–100.0)

## 2018-07-15 MED ORDER — VALPROATE SODIUM 500 MG/5ML IV SOLN
750.0000 mg | INTRAVENOUS | Status: DC
  Administered 2018-07-15 – 2018-07-16 (×6): 750 mg via INTRAVENOUS
  Filled 2018-07-15: qty 7.5
  Filled 2018-07-15: qty 5
  Filled 2018-07-15: qty 7.5
  Filled 2018-07-15: qty 5
  Filled 2018-07-15: qty 7.5
  Filled 2018-07-15: qty 5
  Filled 2018-07-15 (×4): qty 7.5

## 2018-07-15 NOTE — Progress Notes (Signed)
Subjective: No significant changes.  Exam: Vitals:   07/15/18 0800 07/15/18 0900  BP: (!) 145/77 139/79  Pulse: 96 97  Resp: 19 19  Temp: 99.7 F (37.6 C)   SpO2: 100% 100%   Gen: In bed, intubated Resp: Ventilated Abd: soft, nt  Neuro:  Pupils are reactive bilaterally, he appears more awake today with eye opening to noxious stimulation.  Though he appears to have a slight right gaze preference, he is not fixed.  He withdraws to noxious stimulation in the right arm and leg, flexion in the left leg and flicker in the left arm   Pertinent Labs: Depakote level 37  Impression: 63 year old male with a history of seizures, but not on antiepileptic therapy, admitted on May 4 with status epilepticus to [facility].  Initially, LTM recorded multiple electrographic seizures with interictal epileptiform discharges on the left.    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  I suspect that his seizures were precipitated by his infarct.  With weaning, he has not had any further seizures but continues to have an extremely irritable area on the left, though there does seem to be some improvement in this.  I had a long discussion with his daughter today.  I still think it is reasonable to give a little more time to have clear medications but given the size of his infarct I suspect that he has a relatively poor prognosis.  I discussed this with his daughter, and it sounds like that if he is able to be interactive and talk, nursing home level of care would be acceptable.  I indicated that it would be reasonable to give him a few more days, but if he is not making progress then I think serious consideration to what his wishes would be would have to be given to tracheostomy/PEG given that his best case scenario is nursing home level.  She is going to discuss it with family, with the anticipation of having to make a decision about that later this week.  Recommendations: 1)  Trileptal 600 mg twice daily 2) Daily Depakote levels 3) increase VPA to 750 mg every 4 hours  4) continue Keppra 1.5 g twice daily  5) continue lacosamide 200mg  BID  This patient is critically ill and at significant risk of neurological worsening, death and care requires constant monitoring of vital signs, hemodynamics,respiratory and cardiac monitoring, neurological assessment, discussion with family, other specialists and medical decision making of high complexity. I spent 40 minutes of neurocritical care time  in the care of  this patient. This was time spent independent of any time provided by nurse practitioner or PA.  Ritta Slot, MD [facility] [phone]  If 7pm- 7am, please page neurology on call as listed in AMION. 07/15/2018  10:05 AM

## 2018-07-15 NOTE — Progress Notes (Signed)
NAME:  Edward Cooper, MRN:  [id number], DOB:  [birth year], LOS: 13 ADMISSION DATE:  07/19/2018, CONSULTATION DATE:  07/12/2018 REFERRING MD:  [facility], CHIEF COMPLAINT:  seizures  Brief History   [age] yoM presenting with witnessed seizures x 3, did not return to baseline and required intubation for airway protection.  [facility].    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  5/12 weaning versed and up-titrating propofol in hopes of burst suppression.  5/16: No further seizures.  Remains comatose however tolerating pressure support ventilation, starting antibiotics for temperature and left lower lobe pneumonia 5/17 looks about the same  Consults:  Neurology   Procedures:  5/4 ETT >> 5/13 Rt PICC >>   Significant Diagnostic Tests:  [facility] chronic changes, no acute abnormality 5/5 EEG >>abnormal, intermittent left hemispheric sharp waves.   LTM EEG 5/5 >> multiple seizures arising from left paracentral frontocentral and central parietal cortex. 5/7 left PLEDs 5/10 EEG Left frontocentral central parietal poorly formed sharply still present suggestive of some degree of cortical irritability in that region.   5/11 EEG  Occasionally left frontocentral sharp waves and spikes present suggestive of some degree of cortical irritability. No clinical or subclinical seizures present throughout the recording.  5/12 MRI >> Extensive RIGHT hemisphere infarct, RIGHT MCA territory, both acute and subacute, with areas of reperfusion hemorrhage affecting not only cortex but the basal  ganglia.Some extra-axial hemorrhage on the RIGHT  Micro Data:  5/4 MRSA PCR  >>neg 5/5 respiratory >> neg 5/14 C diff Ag POS, PCR positive 5/16:Sputum: Moderate gram-negative rods, few gram-positive cocci Antimicrobials:  5/5 unasyn > 5/11 5/14 vancomycin oral >> 5/16: Vancomycin>>> 5/16: Cefepime>>> Interim history/subjective:   No issues overnight, fever curve about the same, white blood cell count improving  Objective   Blood pressure (Abnormal) 150/83, pulse (Abnormal) 102, temperature (Abnormal) 100.4 F (38 C), temperature source Axillary, resp. rate 16, height 5\' 11"  (1.803 m), weight 83.9 kg, SpO2 99 %.    Vent Mode: PSV;CPAP FiO2 (%):  [40 %] 40 % Set Rate:  [16 bmp] 16 bmp Vt Set:  [600 mL] 600 mL PEEP:  [5 cmH20] 5 cmH20 Pressure Support:  [8 cmH20-10 cmH20] 10 cmH20 Plateau Pressure:  [16 cmH20-17 cmH20] 17 cmH20   Intake/Output Summary (Last 24 hours) at 07/15/2018 0753 Last data filed at 07/15/2018 0600 Gross per 24 hour  Intake 2609.12 ml  Output 2430 ml  Net 179.12 ml   Filed Weights   07/12/18 0500 07/13/18 0500 07/14/18 0428  Weight: 72.7 kg 72.6 kg 83.9 kg    Examination:   General: This is a unresponsive 63 year old male patient who remains on mechanical ventilation HEENT normocephalic atraumatic orally intubated no JVD mucous membranes are moist Pulmonary: Scattered rhonchi equal chest rise excellent tidal volume on pressure support ventilation of 8.  Cardiac: Regular rate and rhythm mildly tachycardic Extremities: Warm, dry.  The right lower extremity pulses dopplered but present.  Has dependent edema. Abdomen: Soft, not tender no organomegaly Neuro: Opens eyes to noxious stimulus only GU: Clear yellow has  a condom catheter in place  Resolved Hospital Problem list   AKI  Aspiration PNA w/ RLL infiltrate (completed 7 d rx)  Assessment & Plan:   Status epilepticus likely due to large right MCA infarct/reperfusion hemorrhage ? alcohol  withdrawal contributing Plan No change in current AEDs as outlined By neurology Avoid fever Correct water deficit  Acute respiratory failure w/ ventilator dependence d/t on-going encephalopathy Possible left lower lobe hcap Plan Continue pressure support ventilation, once again mental status is barrier to extubation  Repeat chest x-ray 5/18  Follow-up sputum culture  Day #2 vancomycin and cefepime, narrow as sputum cultures dictate  I think he will need tracheostomy  Continue VAP bundle    HTN and HLD Plan Blood pressure monitoring Continue statin  CAD s/p PCI, LV thrombus on coumadin,  PVD, ICM (EF 35-40% 2014) Echo this admission does not show LV thrombus Plan Continue Brilinta  Continue telemetry monitoring    Fever, leukocytosis C diff colitis- -sputum consistent with normal flora Plan Will extend vancomycin for 10 days after completion of current antibiotics  Fluid and electrolyte imbalance: iatrogenic hypernatremia; this is improving Plan Continue free water replacement A.m. chemistry  Hyperglycemia Plan ssi   Best practice:  Diet: NPO, TF Pain/Anxiety/Delirium protocol (if indicated): sedation needs based on EEG VAP protocol (if indicated): yes DVT prophylaxis: SCDs GI prophylaxis: PPI Glucose control: SSI sensitive Mobility: BR Code Status: Full  Family Communication: Daughter updated by neurology Disposition: ICU  No significant change.  Unfortunately suspect he will need tracheostomy for airway protection.  We will continue supportive care including current antimicrobial coverage for possible VAP.   Simonne Martinet ACNP-BC [facility] Pulmonary/Critical Care Pager # [phone] OR # [phone] if no answer     07/15/2018, 7:53 AM

## 2018-07-16 ENCOUNTER — Inpatient Hospital Stay (HOSPITAL_COMMUNITY): Payer: Medicare Other

## 2018-07-16 ENCOUNTER — Encounter (HOSPITAL_COMMUNITY): Payer: Self-pay | Admitting: Radiology

## 2018-07-16 DIAGNOSIS — J14 Pneumonia due to Hemophilus influenzae: Secondary | ICD-10-CM

## 2018-07-16 LAB — GLUCOSE, CAPILLARY
Glucose-Capillary: 81 mg/dL (ref 70–99)
Glucose-Capillary: 87 mg/dL (ref 70–99)
Glucose-Capillary: 79 mg/dL (ref 70–99)
Glucose-Capillary: 99 mg/dL (ref 70–99)
Glucose-Capillary: 97 mg/dL (ref 70–99)
Glucose-Capillary: 104 mg/dL — ABNORMAL HIGH (ref 70–99)

## 2018-07-16 LAB — CULTURE, RESPIRATORY W GRAM STAIN

## 2018-07-16 LAB — VALPROIC ACID LEVEL: Valproic Acid Lvl: 26 ug/mL — ABNORMAL LOW (ref 50.0–100.0)

## 2018-07-16 MED ORDER — IOHEXOL 350 MG/ML SOLN
50.0000 mL | Freq: Once | INTRAVENOUS | Status: AC | PRN
  Administered 2018-07-16: 11:00:00 50 mL via INTRAVENOUS

## 2018-07-16 MED ORDER — SODIUM CHLORIDE 0.9 % IV SOLN
2.0000 g | INTRAVENOUS | Status: AC
  Administered 2018-07-16 – 2018-07-20 (×5): 2 g via INTRAVENOUS
  Filled 2018-07-16 (×5): qty 20

## 2018-07-16 MED ORDER — VALPROATE SODIUM 500 MG/5ML IV SOLN
1000.0000 mg | INTRAVENOUS | Status: DC
  Administered 2018-07-16 – 2018-07-17 (×7): 1000 mg via INTRAVENOUS
  Filled 2018-07-16: qty 5
  Filled 2018-07-16 (×10): qty 10

## 2018-07-16 MED ORDER — CHLORHEXIDINE GLUCONATE CLOTH 2 % EX PADS
6.0000 | MEDICATED_PAD | Freq: Every day | CUTANEOUS | Status: DC
  Administered 2018-07-17 – 2018-07-24 (×7): 6 via TOPICAL

## 2018-07-16 MED ORDER — VALPROATE SODIUM 500 MG/5ML IV SOLN
750.0000 mg | Freq: Once | INTRAVENOUS | Status: AC
  Administered 2018-07-16: 12:00:00 750 mg via INTRAVENOUS
  Filled 2018-07-16: qty 5

## 2018-07-16 NOTE — Progress Notes (Signed)
PT Cancellation Note  Patient Details Name: Edward Cooper MRN: [id number] DOB: 07/30/1955   Cancelled Treatment:    Reason Eval/Treat Not Completed: Medical issues which prohibited therapy(stat head Ct)   Fabio Asa 07/16/2018, 11:41 AM Charlotte Crumb, PT DPT  Board Certified Neurologic Specialist Acute Rehabilitation Services Pager [phone] Office [phone]

## 2018-07-16 NOTE — Progress Notes (Addendum)
NAME:  Edward Cooper, MRN:  [id number], DOB:  October 04, 1955, LOS: 14 ADMISSION DATE:  07/29/2018, CONSULTATION DATE:  07/04/2018 REFERRING MD:  [facility], CHIEF COMPLAINT:  seizures  Brief History   [age] yoM presenting with witnessed seizures x 3, did not return to baseline and required intubation for airway protection.  [facility]    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  5/12 weaning versed and up-titrating propofol in hopes of burst suppression.  5/16: No further seizures.  Remains comatose however tolerating pressure support ventilation, starting antibiotics for temperature and left lower lobe pneumonia 5/17 looks about the same  5/18 eyes open, does not track Consults:  Neurology   Procedures:  5/4 ETT >> 5/13 Rt PICC >>   Significant Diagnostic Tests:  5/4 [facility] >> chronic changes, no acute abnormality 5/5 EEG >>abnormal, intermittent left hemispheric sharp waves.   LTM EEG 5/5 >> multiple seizures arising from left paracentral frontocentral and central parietal cortex. 5/7 left PLEDs 5/10 EEG Left frontocentral central parietal poorly formed sharply still present suggestive of some degree of cortical irritability in that region.   5/11 EEG  Occasionally left frontocentral sharp waves and spikes present suggestive of some degree of cortical irritability. No clinical or subclinical seizures present throughout the recording.  5/12 MRI >> Extensive RIGHT hemisphere infarct, RIGHT MCA territory, both acute and subacute, with areas of reperfusion hemorrhage affecting  not only cortex but the basal ganglia.Some extra-axial hemorrhage on the RIGHT  Micro Data:  5/4 MRSA PCR  >>neg 5/5 respiratory >> neg 5/14 C diff Ag POS, PCR positive 5/16:Sputum: Moderate gram-negative rods, few gram-positive cocci  Antimicrobials:  5/5 unasyn > 5/11 5/14 vancomycin oral >> 5/16: Vancomycin>>> 5/16: Cefepime>>> Interim history/subjective:   No overnight issues T-max 100.9 Objective   Blood pressure 134/80, pulse 84, temperature 98.6 F (37 C), temperature source Axillary, resp. rate 16, height 5\' 11"  (1.803 m), weight 79.1 kg, SpO2 100 %.    Vent Mode: PSV;CPAP FiO2 (%):  [40 %] 40 % Set Rate:  [16 bmp] 16 bmp Vt Set:  [600 mL] 600 mL PEEP:  [5 cmH20] 5 cmH20 Pressure Support:  [10 cmH20-12 cmH20] 12 cmH20 Plateau Pressure:  [16 cmH20-18 cmH20] 18 cmH20   Intake/Output Summary (Last 24 hours) at 07/16/2018 0757 Last data filed at 07/16/2018 0700 Gross per 24 hour  Intake 4076.35 ml  Output 2575 ml  Net 1501.35 ml   Filed Weights   07/13/18 0500 07/14/18 0428 07/16/18 0500  Weight: 72.6 kg 83.9 kg 79.1 kg    Examination:  Does appear comfortable, on mechanical ventilation Normocephalic atraumatic, orally intubated Scattered rhonchi, good volumes on pressure support S1-S2 appreciated Bowel sounds appreciated Extremities shows no clubbing no edema He does move his left lower extremity to noxious stimuli Eyes open, does not track  Resolved Hospital Problem list   AKI  Aspiration PNA w/ RLL infiltrate (completed 7 d rx)  Assessment & Plan:   Status epilepticus, large right MCA infarct/reperfusion hemorrhage ?  Alcohol withdrawal contributing -No change in current AEDs as outlined  by neurology -Correct word of deficit  Acute respiratory failure with ventilator dependence due to ongoing encephalopathy Possible left lower lobe healthcare associated pneumonia   Acute respiratory failure w/ ventilator dependence d/t on-going encephalopathy  Possible left lower lobe HCAP -Respiratory cultures reveal H. Influenzae -We will discontinue vancomycin and cefepime and start patient on Rocephin -Rocephin 2 g daily for 7 days -May end up needing tracheostomy  Hypertension and hyperlipidemia -Blood pressure monitoring -Continue statin  Coronary artery disease status post PCI, left ventricular thrombus on Coumadin, PVD Ejection fraction 35 to 40% in 2014 -Continue Brilinta -Telemetry monitoring  Fever, leukocytosis C. difficile colitis -Sputum showing H influenza -Switched to ceftriaxone -Fever likely related to C. difficile colitis  Fluid and electrolyte imbalance -Hypernatremia being corrected -Trend electrolytes  Hyperglycemia -SSI   Best practice:  Diet: Tube feeds Pain/Anxiety/Delirium protocol (if indicated): Sedation as needed VAP protocol (if indicated): yes DVT prophylaxis: SCDs GI prophylaxis: EPI Glucose control: SSI, sensitive scale Mobility: BR Code Status: Full  Family Communication: Daughter updated by neurology Disposition: ICU  The patient is critically ill with multiple organ system failure and requires high complexity decision making for assessment and support, frequent evaluation and titration of therapies, advanced monitoring, review of radiographic studies and interpretation of complex data.    Critical Care Time devoted to patient care services, exclusive of separately billable procedures, described in this note is 30 minutes.  Estephani Popper.  MD Robertson, PCCM Cell: [phone]

## 2018-07-16 NOTE — Progress Notes (Signed)
RT NOTE: RT transported patient on ventilator from room 4N20 to CT and back to room 4N20 with no complications. Patient was placed on full support for transport and is now back weaning on ventilator. Vitals are stable. RT will continue to monitor.

## 2018-07-16 NOTE — Progress Notes (Addendum)
NEUROLOGY PROGRESS NOTE  Subjective: Patient at this time is weaning off the vent.,  Not able to follow commands.  Exam: Vitals:   07/16/18 0751 07/16/18 0800  BP:  138/82  Pulse:  91  Resp:  14  Temp: 98.6 F (37 C)   SpO2:  100%   Physical Exam  HEENT-  Normocephalic, no lesions, without obvious abnormality.  Normal external eye and conjunctiva.   Extremities- Warm, dry and intact Musculoskeletal-no joint tenderness, deformity or swelling Skin-warm and dry, no hyperpigmentation, vitiligo, or suspicious lesions Neuro:  Mental Status: Patient does not respond to verbal stimuli.  Winces with deep sternal rub.  Does not follow commands.  No verbalizations noted.  Intubated and breathing of the vent currently weaning. Cranial Nerves: II: patient does not respond confrontation bilaterally,  III,IV,VI: doll's response present bilaterally. pupils right 2 mm, left 2 mm,and reactive bilaterally, tends to preference the right with roving eyes V,VII: corneal reflex present bilaterally  VIII: patient does not respond to verbal stimuli  Motor: With noxious stimuli patient withdraws RUE but almost has an extensor posturing type response on LUE. Patient withdraws to noxious stimuli bilaterally with flexion, knee flexion and upgoing toes but not triple flexion Sensory: Does respond to noxious stimuli as above Plantars: absent bilaterally Cerebellar: Unable to perform    Medications:  Scheduled: . atorvastatin  80 mg Per Tube q1800  . chlorhexidine gluconate (MEDLINE KIT)  15 mL Mouth Rinse BID  . Chlorhexidine Gluconate Cloth  6 each Topical Daily  . enoxaparin (LOVENOX) injection  40 mg Subcutaneous Q24H  . feeding supplement (PRO-STAT SUGAR FREE 64)  30 mL Oral BID  . folic acid  1 mg Per Tube Daily  . free water  200 mL Per Tube Q8H  . insulin aspart  0-9 Units Subcutaneous Q4H  . mouth rinse  15 mL Mouth Rinse 10 times per day  . multivitamin with minerals  1 tablet Per Tube  Daily  . OXcarbazepine  600 mg Per Tube BID  . pantoprazole sodium  40 mg Per Tube Q1200  . sodium chloride flush  10-40 mL Intracatheter Q12H  . thiamine  100 mg Per Tube Daily  . ticagrelor  90 mg Per Tube BID  . vancomycin  125 mg Oral QID   Continuous: . sodium chloride Stopped (07/10/18 2153)  . cefTRIAXone (ROCEPHIN)  IV    . feeding supplement (OSMOLITE 1.5 CAL) 45 mL/hr at 07/16/18 0700  . lacosamide (VIMPAT) IV 200 mg (07/16/18 0612)  . levETIRAcetam 1,500 mg (07/16/18 0539)  . valproate sodium 750 mg (07/16/18 0800)    Pertinent Labs/Diagnostics: Valproic acid level was 26 on 07/16/2018  Etta Quill PA-C [facility] [phone]  Stroke labs LDL: 84 A1c: 5.3 Echo: LVEF 60-65%, mild MAC, LA size normal. No shunt by color doppler. No LV thrombus Carotid doppler with RICA 02-40%, LICA 9-73%.   Assessment: 63 year old male with history of seizure, but not and antiepileptic therapy on arrival.  Admitted on May 4 with status epilepticus from [facility]  Initial LTM record showed multiple electrographic seizures with interictal epileptiform discharges on the left.  As noted in previous assessment MRI revealed a large right MCA infarct as well as smaller left cortical infarcts.  Suspicion was that seizures were precipitated by his infarct.  Dr. Cecil Cobbs note: "I had a long discussion with his daughter .  I still think it is reasonable to give a little more time to have clear medications but given the size of  his infarct I suspect that he has a relatively poor prognosis.  I discussed this with his daughter, and it sounds like that if he is able to be interactive and talk, nursing home level of care would be acceptable.  I indicated that it would be reasonable to give him a few more days, but if he is not making progress then I think serious consideration to what his wishes would be would have to be given to tracheostomy/PEG given that his best case scenario is nursing  home level.  She is going to discuss it with family, with the anticipation of having to make a decision about that later this week."  His exam continues to remain unchanged but he is also positive for C.diff. That can confound the exam. He is at a point where decisions about trach/PEG need to be made. I will examine him again in a day or two, and in agreement with Dr. Kirkpatrick, by Wednesday should be able to get a much clearer picture of what his clincal condition is at the time so that decision to trach/PEG can be made then.  Valproate is not at therapeutic range.  Recommendations: 1) Trileptal 600 mg twice daily 2) Daily Depakote levels-today 26. 3)Give an additional 750mg VPA x1 now and increase VPA to 1000 mg 4 times a day and continue to follow levels. 4) continue Keppra 1.5 g twice daily  5) continue lacosamide 200mg BID 6) Management of Cdiff per primary team 7) c/w home antiplatelet 8) CTA head/neck to complete stroke w/u  07/16/2018, 9:42 AM  Attending Neurohospitalist Addendum Patient seen and examined with APP/Resident. Agree with the history and physical as documented above. Agree with the plan as documented, which I helped formulate. I have independently reviewed the chart, obtained history, review of systems and examined the patient.I have personally reviewed pertinent head/neck/spine imaging (CT/MRI). Please feel free to call with any questions. --- Ashish Arora, MD [facility] Pager: [phone]  If 7pm to 7am, please call on call as listed on AMION.  CRITICAL CARE ATTESTATION Performed by: Ashish Arora, MD Total critical care time: 20 minutes Critical care time was exclusive of separately billable procedures and treating other patients and/or supervising APPs/Residents/Students Critical care was necessary to treat or prevent imminent or life-threatening deterioration due to seizures, stroke  This patient is critically ill and at significant risk for  neurological worsening and/or death and care requires constant monitoring. Critical care was time spent personally by me on the following activities: development of treatment plan with patient and/or surrogate as well as nursing, discussions with consultants, evaluation of patient's response to treatment, examination of patient, obtaining history from patient or surrogate, ordering and performing treatments and interventions, ordering and review of laboratory studies, ordering and review of radiographic studies, pulse oximetry, re-evaluation of patient's condition, participation in multidisciplinary rounds and medical decision making of high complexity in the care of this patient. 

## 2018-07-17 ENCOUNTER — Inpatient Hospital Stay (HOSPITAL_COMMUNITY): Payer: Medicare Other

## 2018-07-17 DIAGNOSIS — G9340 Encephalopathy, unspecified: Secondary | ICD-10-CM

## 2018-07-17 DIAGNOSIS — J189 Pneumonia, unspecified organism: Secondary | ICD-10-CM

## 2018-07-17 DIAGNOSIS — A419 Sepsis, unspecified organism: Secondary | ICD-10-CM

## 2018-07-17 DIAGNOSIS — A0472 Enterocolitis due to Clostridium difficile, not specified as recurrent: Secondary | ICD-10-CM

## 2018-07-17 LAB — GLUCOSE, CAPILLARY
Glucose-Capillary: 100 mg/dL — ABNORMAL HIGH (ref 70–99)
Glucose-Capillary: 81 mg/dL (ref 70–99)
Glucose-Capillary: 93 mg/dL (ref 70–99)
Glucose-Capillary: 72 mg/dL (ref 70–99)
Glucose-Capillary: 94 mg/dL (ref 70–99)
Glucose-Capillary: 96 mg/dL (ref 70–99)

## 2018-07-17 LAB — VALPROIC ACID LEVEL: Valproic Acid Lvl: 46 ug/mL — ABNORMAL LOW (ref 50.0–100.0)

## 2018-07-17 LAB — AMMONIA: Ammonia: 40 umol/L — ABNORMAL HIGH (ref 9–35)

## 2018-07-17 MED ORDER — VALPROATE SODIUM 500 MG/5ML IV SOLN
750.0000 mg | INTRAVENOUS | Status: DC
  Administered 2018-07-17 – 2018-07-20 (×16): 750 mg via INTRAVENOUS
  Filled 2018-07-17: qty 5
  Filled 2018-07-17 (×19): qty 7.5

## 2018-07-17 NOTE — Progress Notes (Signed)
Nutrition Follow-up RD working remotely.  DOCUMENTATION CODES:   Not applicable  INTERVENTION:   Continue:  Osmolite 1.5 @ 45 ml/hr (1080 ml/day) via 18 F OG tube 30 ml Pro-stat BID MVI  Provides: 1820 kcal, 97 grams protein, and 825 ml free water.   NUTRITION DIAGNOSIS:   Inadequate oral intake related to inability to eat as evidenced by NPO status. Ongoing.   GOAL:   Patient will meet greater than or equal to 90% of their needs Met.   MONITOR:   Vent status, Labs, Weight trends  REASON FOR ASSESSMENT:   Ventilator    ASSESSMENT:   63 year old male who presented to the [facility] ED on 5/04 after having a witnessed seizure. PMH of EtOH abuse, CAD, HTN, HLD, cardiomyopathy, GERD, and seizure disorder. Pt intubated in the ED and transferred to [facility].  Pt is currently off sedation. OGT in place. 5/11 remains on vent with abnormal EEG 5/12 weaning versed and increasing propofol for burst suppression 5/13 Propofol weaned off  Remains on vent support, MD discussing with family trach/PEG. Per MD pt not following commands and his cdiff infection could be causing some of this.   Patient is currently intubated on ventilator support MV: 7.6 L/min Temp (24hrs), Avg:98.9 F (37.2 C), Min:98.2 F (36.8 C), Max:100.1 F (37.8 C)  Medications reviewed and include: folic acid, SSI, thiamine, MVI 200 ml free water every 8 hours = 600 ml   Labs reviewed:  CBG's: 99-97-104-100-81 Pt is 15.4 L positive; weight is up 40 lb from admission; generalized edema  NUTRITION - FOCUSED PHYSICAL EXAM:  Unable to complete at this time. RD working remotely.  Diet Order:   Diet Order            Diet NPO time specified  Diet effective now              EDUCATION NEEDS:   No education needs have been identified at this time  Skin:  Skin Assessment: Reviewed RN Assessment  Last BM:  375 ml via rectal tube, cdiff positive  Height:   Ht Readings from Last 1 Encounters:  07/04/18  _0  (1.803 m)    Weight:   Wt Readings from Last 1 Encounters:  07/17/18 80.1 kg    Ideal Body Weight:  78.2 kg  BMI:  Body mass index is 24.63 kg/m.  Estimated Nutritional Needs:   Kcal:  1887  Protein:  90-105 grams  Fluid:  >/= 1.9 L  Maylon Peppers RD, LDN, [place] Pager [phone] After Hours Pager

## 2018-07-17 NOTE — Progress Notes (Signed)
EEG complete - results pending 

## 2018-07-17 NOTE — Progress Notes (Signed)
Physical Therapy Treatment Patient Details Name: Edward Cooper MRN: [id number] DOB: 1955-06-04 Today's Date: 07/17/2018    History of Present Illness Pt is a 63 y.o. M with significant PMH of CAD s/p PCI, PVD who presents with witnessed seizures x 3, requiring intubation for airway protection on 5/4. Imaging on 5/12 showing extensive right hemisphere infarct, right MCA territory both acute and subacute with areas of reperfusion hemorrhage affecting cortex and basal ganglia. Some extra axial hemorrhage on the right.     PT Comments    Patient seen for intervention attempts, modest progress in withdraw response (now withdrawing all extremities as opposed to just LEs last session). No functional or purposeful engagement.  Pt remains minimally responsive.  He has flexor withdraw to painful stimuli all 4 extremties today. Ocassional eyes opening to stimuli with noted right gaze. Blinks to threat.  Moved into chair position to ROM activities, increased tone noted all extremities but breaks easily. VSS throughout session. Current POC remains appropriate.  Follow Up Recommendations  LTACH     Equipment Recommendations  Other (comment)(defer)    Recommendations for Other Services       Precautions / Restrictions Precautions Precautions: Other (comment) Precaution Comments: rectal tube, cont EEG    Mobility  Bed Mobility                  Transfers                    Ambulation/Gait                 Stairs             Wheelchair Mobility    Modified Rankin (Stroke Patients Only) Modified Rankin (Stroke Patients Only) Pre-Morbid Rankin Score: No symptoms Modified Rankin: Severe disability     Balance                                            Cognition Arousal/Alertness: Lethargic Behavior During Therapy: (asleep) Overall Cognitive Status: Difficult to assess                                 General Comments: Pt  remains minimally responsive.  He has flexor withdraw to painful stimuli all 4 extremties today. Ocassional eyes opening to stimuli with noted right gaze. Blinks to threat.  Moved into chair position to ROM activities, increased tone noted all extremities but breaks easily. No purposeful engagement. VSS throughout session.      Exercises General Exercises - Upper Extremity Shoulder Flexion: PROM;Both;10 reps Shoulder Horizontal ABduction: PROM;Both;10 reps Shoulder Horizontal ADduction: PROM;Both;10 reps Elbow Flexion: PROM;Both;10 reps Elbow Extension: PROM;Both;10 reps Wrist Flexion: PROM;Both;10 reps General Exercises - Lower Extremity Ankle Circles/Pumps: PROM;Both;10 reps Heel Slides: PROM;Both;10 reps Hip ABduction/ADduction: PROM;Both;10 reps    General Comments General comments (skin integrity, edema, etc.): VSS      Pertinent Vitals/Pain Pain Assessment: Faces Faces Pain Scale: Hurts a little bit Pain Location: withdraw and grimace to pain stimuli    Home Living                      Prior Function            PT Goals (current goals can now be found in the care plan section) Acute Rehab  PT Goals Patient Stated Goal: unable PT Goal Formulation: Patient unable to participate in goal setting Time For Goal Achievement: 07/26/18 Potential to Achieve Goals: Fair Progress towards PT goals: Not progressing toward goals - comment    Frequency    Min 2X/week      PT Plan Current plan remains appropriate    Co-evaluation              AM-PAC PT "6 Clicks" Mobility   Outcome Measure  Help needed turning from your back to your side while in a flat bed without using bedrails?: Total Help needed moving from lying on your back to sitting on the side of a flat bed without using bedrails?: Total Help needed moving to and from a bed to a chair (including a wheelchair)?: Total Help needed standing up from a chair using your arms (e.g., wheelchair or bedside  chair)?: Total Help needed to walk in hospital room?: Total Help needed climbing 3-5 steps with a railing? : Total 6 Click Score: 6    End of Session Equipment Utilized During Treatment: Oxygen Activity Tolerance: Patient limited by lethargy Patient left: in bed;with call bell/phone within reach Nurse Communication: Mobility status PT Visit Diagnosis: Other abnormalities of gait and mobility (R26.89);Other symptoms and signs involving the nervous system (R29.898)     Time: 7425-9563 PT Time Calculation (min) (ACUTE ONLY): 17 min  Charges:  $Therapeutic Activity: 8-22 mins                     Charlotte Crumb, PT DPT  Board Certified Neurologic Specialist Acute Rehabilitation Services Pager [phone] Office [phone]    Fabio Asa 07/17/2018, 5:06 PM

## 2018-07-17 NOTE — Progress Notes (Addendum)
Neurology Progress Note   S:// Patient seen and examined. No acute overnight changes Some left leg twitching noticed intermittently   O:// Current vital signs: BP 113/70   Pulse (!) 101   Temp 98.9 F (37.2 C) (Axillary)   Resp 16   Ht 5' 11"  (1.803 m)   Wt 80.1 kg   SpO2 100%   BMI 24.63 kg/m  Vital signs in last 24 hours: Temp:  [98.2 F (36.8 C)-100.1 F (37.8 C)] 98.9 F (37.2 C) (05/19 0400) Pulse Rate:  [83-106] 101 (05/19 0720) Resp:  [13-24] 16 (05/19 0720) BP: (96-165)/(66-93) 113/70 (05/19 0720) SpO2:  [97 %-100 %] 100 % (05/19 0721) FiO2 (%):  [40 %] 40 % (05/19 0721) Weight:  [80.1 kg] 80.1 kg (05/19 0500) General: Intubated, no sedation HEENT: Normocephalic atraumatic CVs: A2-Z3 heard regular rate rhythm Respiratory: Vented Neurological exam Patient's intubated. He opens eyes to noxious stimulation and voice. He does not follow any commands It appears that he has a mild right gaze preference, and unlike yesterday there is no roving eye movements today. Facial symmetry is difficult to ascertain. On noxious stimulation, there is flexion of the right upper, right lower and left lower extremity.  Left upper extremity is rigid and minimal extension noted to noxious stimulation. There is some ongoing twitching of the left eye muscles noted intermittently during the time of this exam.   Medications  Current Facility-Administered Medications:  .  0.9 %  sodium chloride infusion, , Intravenous, PRN, Amie Portland, MD, Stopped at 07/10/18 2153 .  acetaminophen (TYLENOL) solution 650 mg, 650 mg, Per Tube, Q6H PRN, Rigoberto Noel, MD, 650 mg at 07/16/18 0412 .  albuterol (PROVENTIL) (2.5 MG/3ML) 0.083% nebulizer solution 2.5 mg, 2.5 mg, Nebulization, Q4H PRN, Jennelle Human B, NP .  atorvastatin (LIPITOR) tablet 80 mg, 80 mg, Per Tube, q1800, Collier Bullock, MD, 80 mg at 07/16/18 1708 .  bisacodyl (DULCOLAX) suppository 10 mg, 10 mg, Rectal, Daily PRN, Jennelle Human B, NP .  cefTRIAXone (ROCEPHIN) 2 g in sodium chloride 0.9 % 100 mL IVPB, 2 g, Intravenous, Q24H, Olalere, Adewale A, MD, Stopped at 07/16/18 1217 .  chlorhexidine gluconate (MEDLINE KIT) (PERIDEX) 0.12 % solution 15 mL, 15 mL, Mouth Rinse, BID, Amie Portland, MD, 15 mL at 07/17/18 0750 .  Chlorhexidine Gluconate Cloth 2 % PADS 6 each, 6 each, Topical, Daily, Rigoberto Noel, MD, 6 each at 07/17/18 0041 .  docusate (COLACE) 50 MG/5ML liquid 100 mg, 100 mg, Per Tube, BID PRN, Jennelle Human B, NP .  enoxaparin (LOVENOX) injection 40 mg, 40 mg, Subcutaneous, Q24H, Sood, Vineet, MD, 40 mg at 07/16/18 1001 .  feeding supplement (OSMOLITE 1.5 CAL) liquid 1,000 mL, 1,000 mL, Per Tube, Continuous, Rigoberto Noel, MD, Last Rate: 45 mL/hr at 07/17/18 0200 .  feeding supplement (PRO-STAT SUGAR FREE 64) liquid 30 mL, 30 mL, Oral, BID, Rigoberto Noel, MD, 30 mL at 07/16/18 2107 .  fentaNYL (SUBLIMAZE) injection 50-100 mcg, 50-100 mcg, Intravenous, Q30 min PRN, Rigoberto Noel, MD, 50 mcg at 07/08/18 0452 .  folic acid (FOLVITE) tablet 1 mg, 1 mg, Per Tube, Daily, Chesley Mires, MD, 1 mg at 07/16/18 0958 .  free water 200 mL, 200 mL, Per Tube, Q8H, Salvadore Dom E, NP, 200 mL at 07/17/18 0507 .  insulin aspart (novoLOG) injection 0-9 Units, 0-9 Units, Subcutaneous, Q4H, Jennelle Human B, NP, 1 Units at 07/13/18 0810 .  lacosamide (VIMPAT) 200 mg in sodium chloride 0.9 %  25 mL IVPB, 200 mg, Intravenous, Q12H, Kerney Elbe, MD, Stopped at 07/17/18 0535 .  levETIRAcetam (KEPPRA) IVPB 1500 mg/ 100 mL premix, 1,500 mg, Intravenous, Q12H, Greta Doom, MD, Stopped at 07/17/18 0520 .  LORazepam (ATIVAN) injection 1-2 mg, 1-2 mg, Intravenous, Q1H PRN, Rigoberto Noel, MD .  MEDLINE mouth rinse, 15 mL, Mouth Rinse, 10 times per day, Amie Portland, MD, 15 mL at 07/17/18 0506 .  multivitamin with minerals tablet 1 tablet, 1 tablet, Per Tube, Daily, Chesley Mires, MD, 1 tablet at 07/16/18 0958 .  Oxcarbazepine  (TRILEPTAL) tablet 600 mg, 600 mg, Per Tube, BID, Greta Doom, MD, 600 mg at 07/16/18 2108 .  pantoprazole sodium (PROTONIX) 40 mg/20 mL oral suspension 40 mg, 40 mg, Per Tube, Q1200, Rigoberto Noel, MD, 40 mg at 07/16/18 1001 .  sodium chloride flush (NS) 0.9 % injection 10-40 mL, 10-40 mL, Intracatheter, Q12H, Chesley Mires, MD, 10 mL at 07/16/18 2108 .  sodium chloride flush (NS) 0.9 % injection 10-40 mL, 10-40 mL, Intracatheter, PRN, Chesley Mires, MD .  thiamine (VITAMIN B-1) tablet 100 mg, 100 mg, Per Tube, Daily, Chesley Mires, MD, 100 mg at 07/16/18 0958 .  ticagrelor (BRILINTA) tablet 90 mg, 90 mg, Per Tube, BID, Collier Bullock, MD, 90 mg at 07/16/18 2108 .  valproate (DEPACON) 1,000 mg in dextrose 5 % 50 mL IVPB, 1,000 mg, Intravenous, Q4H, Marliss Coots, PA-C, Last Rate: 60 mL/hr at 07/17/18 0600 .  vancomycin (VANCOCIN) 50 mg/mL oral solution 125 mg, 125 mg, Oral, QID, Rigoberto Noel, MD, 125 mg at 07/16/18 2107 Labs CBC    Component Value Date/Time   WBC 17.5 (H) 07/15/2018 0406   RBC 3.58 (L) 07/15/2018 0406   HGB 10.8 (L) 07/15/2018 0406   HCT 34.3 (L) 07/15/2018 0406   PLT 386 07/15/2018 0406   MCV 95.8 07/15/2018 0406   MCH 30.2 07/15/2018 0406   MCHC 31.5 07/15/2018 0406   RDW 15.8 (H) 07/15/2018 0406   LYMPHSABS 1.0 07/08/2018 0329   MONOABS 2.9 (H) 07/08/2018 0329   EOSABS 0.1 07/08/2018 0329   BASOSABS 0.0 07/08/2018 0329    CMP     Component Value Date/Time   NA 147 (H) 07/15/2018 0406   K 3.8 07/15/2018 0406   CL 112 (H) 07/15/2018 0406   CO2 26 07/15/2018 0406   GLUCOSE 111 (H) 07/15/2018 0406   BUN 15 07/15/2018 0406   CREATININE 0.69 07/15/2018 0406   CALCIUM 8.4 (L) 07/15/2018 0406   PROT 5.9 (L) 07/15/2018 0406   ALBUMIN 1.7 (L) 07/15/2018 0406   AST 54 (H) 07/15/2018 0406   ALT 39 07/15/2018 0406   ALKPHOS 54 07/15/2018 0406   BILITOT 0.5 07/15/2018 0406   GFRNONAA >60 07/15/2018 0406   GFRAA >60 07/15/2018 0406   Valproate 46      Imaging I have reviewed images in epic and the results pertinent to this consultation are: MRI of the brain with a large right MCA stroke.  Small punctate cortical infarcts on the left, some extra-axial hemorrhage noted on 07/10/2018 in the right anterior parietal convexity.  Some amount of petechial hemorrhage over the areas of the stroke.  Small chronic cerebellar infarcts.  CTA head and neck with near occlusive stenosis of the proximal right ICA at the bifurcation, atherosclerotic changes in the left carotid bifurcation, atherosclerotic changes of the aortic arch and cavernous internal carotid arteries bilaterally. Expected evolution of the large right MCA infarct with no significant change  and minimal petechial hemorrhage in the basal ganglia.  Asymmetric continuation of the MCA branches on the right without significant proximal stenosis or occlusion.  Moderate proximal left P2 stenosis.  Mild diffuse distal medium and small vessel disease within the circle of Willis.  Multilevel facet degenerative changes in the C-spine and extensive dental disease also evident.  Assessment: 63 year old man with history of seizures not on antiepileptics admitted on May 4 with status epilepticus from [facility].  Initial LTM record showed multiple arthrographic seizures with interictal epileptiform discharges on the left.  Treated with antiepileptics and sedation.  MRI done revealed a large right MCA infarct and smaller left cortical infarcts-which probably prompted the seizures and status epilepticus. Dr. Leonel Ramsay had an extensive discussion with the daughter last week-according to her, if he is able to be interactive and talkative a nursing home level of care would be acceptable even with considerable amount of paresis. Patient has unfortunately been diagnosed with C. difficile colitis at this time and his examination remains as above-he is not very interactive or talkative at all and not  following commands as of yet. I suspect that there might be a component of this acute infection causing this encephalopathy on top of the large stroke and the fact that he is recovering from a prolonged seizure/status epilepticus. If the family is interested in pursuing tracheostomy/PEG tube and okay with nursing level of care being his norm, that should be pursued as he might take some time to recover from these infections before a reasonable neurological change or exam might happen. 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 Punctate left MCA territory stroke Likely cardioembolic source for stroke-no evidence of A. fib on telemetry as of yet  Recommendations: From a seizure and encephalopathy standpoint: - Trileptal 600 twice daily - Depakote 1004 times a day-can start checking daily levels, he is nearly therapeutic and probably will attain steady state levels in the next 5 to 7 days. - Keppra 1.5 twice daily - Vimpat 200 twice daily -Repeat EEG today-I will follow -Management of C. difficile per primary team as you are  From a stroke prevention standpoint -Continue with the antiplatelet for now -We will need outpatient 30-day cardiac monitoring on discharge  From a disposition standpoint: - Needs time to recover from his current C. difficile colitis. Discussions with the family had by my colleague indicated that it would be acceptable for the family that he be at a nursing home level of care if he is able to have some amount of wakefulness and interactiveness.  At this point, I am not sure that he is there but he also has concurrent infections and is coming off of extensive treatment for status epilepticus.  From a critical care standpoint/respiratory standpoint, he is at a time where decisions about tracheostomy will need to be made and if the family has expressed  desire to give him more time, that should be pursued and placement efforts should be started once active infections are resolved and controlled.  -- Amie Portland, MD [facility] Pager: [phone] If 7pm to 7am, please call on call as listed on AMION.    CRITICAL CARE ATTESTATION Performed by: Amie Portland, MD Total critical care time: 30 minutes Critical care time was exclusive of separately billable procedures and treating other patients and/or supervising APPs/Residents/Students Critical care was necessary to treat or prevent imminent or life-threatening  deterioration due to seizure, stroke  This patient is critically ill and at significant risk for neurological worsening and/or death and care requires constant monitoring. Critical care was time spent personally by me on the following activities: development of treatment plan with patient and/or surrogate as well as nursing, discussions with consultants, evaluation of patient's response to treatment, examination of patient, obtaining history from patient or surrogate, ordering and performing treatments and interventions, ordering and review of laboratory studies, ordering and review of radiographic studies, pulse oximetry, re-evaluation of patient's condition, participation in multidisciplinary rounds and medical decision making of high complexity in the care of this patient.

## 2018-07-17 NOTE — Progress Notes (Addendum)
NAME:  Edward NashJeffrey D Cooper, MRN:  [id number], DOB:  04-03-1955, LOS: 15 ADMISSION DATE:  07/15/2018, CONSULTATION DATE:  07/06/2018 REFERRING MD:  [facility], CHIEF COMPLAINT:  seizures  Brief History   [age] yoM presenting with witnessed seizures x 3, did not return to baseline and required intubation for airway protection.  [facility]    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  5/12 weaning versed and up-titrating propofol in hopes of burst suppression.  5/16: No further seizures.  Remains comatose however tolerating pressure support ventilation, starting antibiotics (cefepime and vancomycin) for temperature and left lower lobe pneumonia 5/17 looks about the same  5/18 eyes open, does not track, antibiotics narrowed to ceftriaxone 5/19: New twitching of left leg, EEG ordered Consults:  Neurology   Procedures:  5/4 ETT >> 5/13 Rt PICC >>   Significant Diagnostic Tests:  5/4 [facility] XxxCTH >> chronic changes, no acute abnormality 5/5 EEG >>abnormal, intermittent left hemispheric sharp waves.   LTM EEG 5/5 >> multiple seizures arising from left paracentral frontocentral and central parietal cortex. 5/7 left PLEDs 5/10 EEG Left frontocentral central parietal poorly formed sharply still present suggestive of some degree of cortical irritability in that region.   5/11 EEG  Occasionally left frontocentral sharp waves and spikes present suggestive of some degree of cortical irritability. No clinical or subclinical seizures present throughout the recording.  5/12 MRI >> Extensive RIGHT  hemisphere infarct, RIGHT MCA territory, both acute and subacute, with areas of reperfusion hemorrhage affecting not only cortex but the basal ganglia.Some extra-axial hemorrhage on the RIGHT  Micro Data:  5/4 MRSA PCR  >>neg 5/5 respiratory >> neg 5/14 C diff Ag POS, PCR positive 5/16:Sputum: H influenza as well as E. coli both sensitive to ceftriaxone  Antimicrobials:  5/5 unasyn > 5/11 5/14 vancomycin oral >> 5/16: Vancomycin>>> 5/18 5/16: Cefepime>>> 5/18 Ceftriaxone 5/18 Interim history/subjective:   No overnight issues T-max 100.9 Objective   Blood pressure 130/77, pulse 95, temperature 98.9 F (37.2 C), temperature source Axillary, resp. rate 11, height 5\' 11"  (1.803 m), weight 80.1 kg, SpO2 99 %.    Vent Mode: PSV;CPAP FiO2 (%):  [40 %] 40 % Set Rate:  [16 bmp] 16 bmp Vt Set:  [600 mL] 600 mL PEEP:  [5 cmH20] 5 cmH20 Pressure Support:  [12 cmH20] 12 cmH20 Plateau Pressure:  [15 cmH20-18 cmH20] 18 cmH20   Intake/Output Summary (Last 24 hours) at 07/17/2018 0825 Last data filed at 07/17/2018 0600 Gross per 24 hour  Intake 2313.86 ml  Output 2625 ml  Net -311.14 ml   Filed Weights   07/14/18 0428 07/16/18 0500 07/17/18 0500  Weight: 83.9 kg 79.1 kg 80.1 kg    Examination:  General: [age] year old male patient lying in bed his eyes are open but he is unresponsive HEENT normocephalic atraumatic sclera nonicteric orally intubated no JVD Pulmonary: Clear to auscultation diminished bases no significant tracheal secretions appears comfortable on pressure support ventilation of 12 cmH2O Cardiac: Regular rate and rhythm without murmur rub or gallop Abdomen: Soft nontender no organomegaly Extremities: Warm and dry, dependent  edema brisk capillary refill. Neuro: Eyes are open, has some weak withdrawal response on the right he is hemiparetic on the left Abdomen: Soft, ongoing liquid stool GU: Clear yellow  Resolved Hospital Problem list   AKI  Aspiration PNA w/ RLL  infiltrate (completed 7 d rx)  Assessment & Plan:   Status epilepticus, large right MCA infarct/reperfusion hemorrhage, complicated by acute metabolic encephalopathy?  Alcohol withdrawal contributing, versus acute infection It is felt the acute stroke likely was the cause of status Neurology notes reviewed. -Had some twitching of the left leg this morning -It appears as though he will be significantly debilitated as a consequence of his stroke Plan Repeating EEG Continue Trileptal 600 twice daily, Depakote 100 mg, 4 times a day, Keppra 1.5 twice a day, Vimpat 200 mg twice a day. Will discuss with team timing of tracheostomy Will eventually need PEG, need to get him treated for acute infections first, and doubtful interventional radiology will want her proceed with PEG until C. difficile treated  Acute respiratory failure w/ ventilator dependence d/t on-going encephalopathy, complicated by ventilator associated pneumonia (E-coli and H influenza both (S) to CTX) -His chest x-ray on 17th showed new right lower lobe infiltrate -His mental status is his major barrier to extubation Plan Continuing ventilatory support with pressure support ventilation as tolerated VAP bundle Repeating chest x-ray in a.m. 5/20 Currently day #4 of antibiotics, this was narrowed to ceftriaxone on 5/18 based on sensitivities.  Completing 7-day course  Hypertension and hyperlipidemia Plan Continuing telemetry and monitoring routine blood pressure Continue statin  Coronary artery disease status post PCI, left ventricular thrombus on Coumadin, PVD Ejection fraction 35 to 40% in 2014 Plan Continue Brilinta Continue telemetry monitoring   Fever, leukocytosis Maximum temperature 100.1, white blood cell count has been trending down, has known C. difficile infection, but also new hcap Plan Continue antibiotics  Repeating CBC in a.m. Trend fever curve  C. difficile colitis Plan Continuing oral vancomycin,  this will need to be continued 10 days after completing antibiotics for pneumonia  Fluid and electrolyte imbalance: hypernatremia  -Slowly improving Plan Follow-up a.m. chemistry Continue current free water replacement   Hyperglycemia Plan Cont ssi   Best practice:  Diet: Tube feeds Pain/Anxiety/Delirium protocol (if indicated): Sedation as needed VAP protocol (if indicated): yes DVT prophylaxis: SCDs GI prophylaxis: EPI Glucose control: SSI, sensitive scale Mobility: BR Code Status: Full  Family Communication: Daughter updated by neurology Disposition: We will keep him in the intensive care unit.  Continues to require critical care support for titration of ventilatory support, treatment of complex infections, and treatment and monitoring of metabolic derangements.  For today he is getting repeat EEG because of left lower extremity twitching.  I will defer antiepileptics to neurology.  I suspect he will need tracheostomy and PEG, neurology is planning on reevaluating in a.m. on 5/20.  We are awaiting follow-up prognostics from them.  I will speak to them in the morning.  My critical care time is 46 minutes Simonne Martinet ACNP-BC [facility] Pulmonary/Critical Care Pager # [phone] OR # [phone] if no answer    Attending section:  Off sedation.  Remains on full vent support.  Went into back up mode with SBT.  BP 130/77   Pulse 95   Temp 99.6 F (37.6 C) (Axillary)   Resp 11   Ht 5\' 11"  (1.803 m)   Wt 80.1 kg   SpO2 99%   BMI 24.63 kg/m   Opens eyes with stimulation.  Not  following commands.  HR regular.  No wheeze.  Abdomen soft.  No edema.  A/p  Acute respiratory failure. - will need trach if family wishes to continue aggressive therapy  Status epilepticus. - continue AEDs - monitor neuro status  HCAP with E coli, H flu. - complete rocephin  C diff colitis. - continue enteral vancomycin  CC time by me independent of APP time 32 minutes  Coralyn Helling, MD  [facility] Pulmonary/Critical Care 07/17/2018, 10:31 AM

## 2018-07-17 NOTE — Procedures (Signed)
ELECTROENCEPHALOGRAM REPORT   Patient: Edward Cooper       Room #: 0Z70D EEG No. ID: [id number] Age: 63 y.o.        Sex: male Referring Physician: Vassie Loll Report Date:  07/17/2018        Interpreting Physician: Thana Farr  History: Edward Cooper is an 63 y.o. male with status epilepticus  Medications:  Lipitor, Rocephin, Folvite, Insulin, Vimpat, Keppra, MVI, Trileptal, Thiamine, Brilinta, Depakote, Vancomycin  Conditions of Recording:  This is a 21 channel routine scalp EEG performed with bipolar and monopolar montages arranged in accordance to the international 10/20 system of electrode placement. One channel was dedicated to EKG recording.  The patient is in the intubated and unsedated state.  Description:  The background activity is slow and poorly organized.  It consists of a low voltage, polymorphic delta rhythm with some intermixed theta activity.  This activity is diffusely distributed.  It is persistent over both hemispheres but the amplitude over the right hemisphere is attenuated as compared to the left.   No epileptiform activity is noted.   Hyperventilation and intermittent photic stimulation were not performed.  IMPRESSION: This is an abnormal electroencephalogram secondary to hemispheric asymmetry as seen on previous recordings.  No epileptiform activity is noted.     Thana Farr, MD Neurology [phone] 07/17/2018, 7:59 PM

## 2018-07-17 NOTE — Progress Notes (Signed)
Pts daughter Edward Cooper contacted. She did not have any questions for nursing staff at this time. I reviewed the plan that Dr. Laurence Slate from Neurology would contact her tomorrow and offered to facilitate a camera call so she could see pt.

## 2018-07-18 ENCOUNTER — Inpatient Hospital Stay (HOSPITAL_COMMUNITY): Payer: Medicare Other

## 2018-07-18 DIAGNOSIS — A0472 Enterocolitis due to Clostridium difficile, not specified as recurrent: Secondary | ICD-10-CM

## 2018-07-18 DIAGNOSIS — L8990 Pressure ulcer of unspecified site, unspecified stage: Secondary | ICD-10-CM

## 2018-07-18 DIAGNOSIS — J189 Pneumonia, unspecified organism: Secondary | ICD-10-CM

## 2018-07-18 LAB — COMPREHENSIVE METABOLIC PANEL
ALT: 22 U/L (ref 0–44)
AST: 37 U/L (ref 15–41)
Albumin: 1.6 g/dL — ABNORMAL LOW (ref 3.5–5.0)
Alkaline Phosphatase: 42 U/L (ref 38–126)
Anion gap: 10 (ref 5–15)
BUN: 14 mg/dL (ref 8–23)
CO2: 26 mmol/L (ref 22–32)
Calcium: 8 mg/dL — ABNORMAL LOW (ref 8.9–10.3)
Chloride: 103 mmol/L (ref 98–111)
Creatinine, Ser: 0.6 mg/dL — ABNORMAL LOW (ref 0.61–1.24)
GFR calc Af Amer: >60 mL/min (ref >60)
GFR calc non Af Amer: >60 mL/min (ref >60)
Glucose, Bld: 113 mg/dL — ABNORMAL HIGH (ref 70–99)
Potassium: 3.3 mmol/L — ABNORMAL LOW (ref 3.5–5.1)
Sodium: 139 mmol/L (ref 135–145)
Total Bilirubin: 0.2 mg/dL — ABNORMAL LOW (ref 0.3–1.2)
Total Protein: 5.8 g/dL — ABNORMAL LOW (ref 6.5–8.1)

## 2018-07-18 LAB — CBC
HCT: 33 % — ABNORMAL LOW (ref 39.0–52.0)
Hemoglobin: 10.5 g/dL — ABNORMAL LOW (ref 13.0–17.0)
MCH: 29.6 pg (ref 26.0–34.0)
MCHC: 31.8 g/dL (ref 30.0–36.0)
MCV: 93 fL (ref 80.0–100.0)
Platelets: 330 10*3/uL (ref 150–400)
RBC: 3.55 MIL/uL — ABNORMAL LOW (ref 4.22–5.81)
RDW: 14.6 % (ref 11.5–15.5)
WBC: 11.5 10*3/uL — ABNORMAL HIGH (ref 4.0–10.5)
nRBC: 0 % (ref 0.0–0.2)

## 2018-07-18 LAB — VALPROIC ACID LEVEL: Valproic Acid Lvl: 37 ug/mL — ABNORMAL LOW (ref 50.0–100.0)

## 2018-07-18 LAB — GLUCOSE, CAPILLARY
Glucose-Capillary: 98 mg/dL (ref 70–99)
Glucose-Capillary: 90 mg/dL (ref 70–99)
Glucose-Capillary: 126 mg/dL — ABNORMAL HIGH (ref 70–99)
Glucose-Capillary: 104 mg/dL — ABNORMAL HIGH (ref 70–99)
Glucose-Capillary: 91 mg/dL (ref 70–99)
Glucose-Capillary: 98 mg/dL (ref 70–99)

## 2018-07-18 MED ORDER — ETOMIDATE 2 MG/ML IV SOLN
40.0000 mg | Freq: Once | INTRAVENOUS | Status: DC
  Filled 2018-07-18: qty 20

## 2018-07-18 MED ORDER — FUROSEMIDE 10 MG/ML IJ SOLN
40.0000 mg | Freq: Once | INTRAMUSCULAR | Status: AC
  Administered 2018-07-18: 40 mg via INTRAVENOUS
  Filled 2018-07-18: qty 4

## 2018-07-18 MED ORDER — ETOMIDATE 2 MG/ML IV SOLN: 40.0000 mg | Freq: Once | INTRAVENOUS | Status: DC

## 2018-07-18 MED ORDER — VECURONIUM BROMIDE 10 MG IV SOLR: 10.0000 mg | Freq: Once | INTRAVENOUS | Status: DC

## 2018-07-18 MED ORDER — FENTANYL CITRATE (PF) 100 MCG/2ML IJ SOLN
200.0000 ug | Freq: Once | INTRAMUSCULAR | Status: DC
  Filled 2018-07-18: qty 4

## 2018-07-18 MED ORDER — VECURONIUM BROMIDE 10 MG IV SOLR
10.0000 mg | Freq: Once | INTRAVENOUS | Status: DC
  Filled 2018-07-18: qty 10

## 2018-07-18 MED ORDER — MIDAZOLAM HCL 2 MG/2ML IJ SOLN: 5.0000 mg | Freq: Once | INTRAMUSCULAR | Status: DC

## 2018-07-18 MED ORDER — FENTANYL CITRATE (PF) 100 MCG/2ML IJ SOLN: 200.0000 ug | Freq: Once | INTRAMUSCULAR | Status: DC

## 2018-07-18 MED ORDER — PROPOFOL 10 MG/ML IV BOLUS: 500.0000 mg | Freq: Once | INTRAVENOUS | Status: DC

## 2018-07-18 MED ORDER — MIDAZOLAM HCL 2 MG/2ML IJ SOLN
5.0000 mg | Freq: Once | INTRAMUSCULAR | Status: DC
  Filled 2018-07-18: qty 6

## 2018-07-18 MED ORDER — POTASSIUM CHLORIDE 20 MEQ/15ML (10%) PO SOLN
40.0000 meq | ORAL | Status: AC
  Administered 2018-07-18 (×2): 40 meq
  Filled 2018-07-18 (×2): qty 30

## 2018-07-18 NOTE — Progress Notes (Signed)
NAME:  Edward NashJeffrey D Cooper, MRN:  [id number], DOB:  [birth year], LOS: 16 ADMISSION DATE:  07/23/2018, CONSULTATION DATE:  07/28/2018 REFERRING MD:  [facility], CHIEF COMPLAINT:  seizures  Brief History   [age] yoM presenting with witnessed seizures x 3, did not return to baseline and required intubation for airway protection.  [facility]    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  5/12 weaning versed and up-titrating propofol in hopes of burst suppression.  5/16: No further seizures.  Remains comatose however tolerating pressure support ventilation, starting antibiotics (cefepime and vancomycin) for temperature and left lower lobe pneumonia 5/17 looks about the same  5/18 eyes open, does not track, antibiotics narrowed to ceftriaxone 5/19: New twitching of left leg, EEG ordered 5/20: EEG negative for seizure.  White blood cell count improving.  No fever spikes. Consults:  Neurology   Procedures:  5/4 ETT >> 5/13 Rt PICC >>   Significant Diagnostic Tests:  [facility] chronic changes, no acute abnormality 5/5 EEG >>abnormal, intermittent left hemispheric sharp waves.   LTM EEG 5/5 >> multiple seizures arising from left paracentral frontocentral and central parietal cortex. 5/7 left PLEDs 5/10 EEG Left frontocentral central parietal poorly formed sharply still present suggestive of some degree of cortical irritability in that region.   5/11 EEG  Occasionally left frontocentral sharp waves and spikes present suggestive of some degree of cortical irritability. No clinical or  subclinical seizures present throughout the recording.  5/12 MRI >> Extensive RIGHT hemisphere infarct, RIGHT MCA territory, both acute and subacute, with areas of reperfusion hemorrhage affecting not only cortex but the basal ganglia.Some extra-axial hemorrhage on the RIGHT  Micro Data:  5/4 MRSA PCR  >>neg 5/5 respiratory >> neg 5/14 C diff Ag POS, PCR positive 5/16:Sputum: H influenza as well as E. coli both sensitive to ceftriaxone  Antimicrobials:  5/5 unasyn > 5/11 5/14 vancomycin oral >> 5/16: Vancomycin>>> 5/18 5/16: Cefepime>>> 5/18 Ceftriaxone 5/18 Interim history/subjective:  No issues overnight.  EEG negative  Objective   Blood pressure (Abnormal) 152/88, pulse 92, temperature 99.5 F (37.5 C), temperature source Axillary, resp. rate 16, height 5\' 11"  (1.803 m), weight 79.9 kg, SpO2 100 %.    Vent Mode: PRVC FiO2 (%):  [40 %] 40 % Set Rate:  [16 bmp] 16 bmp Vt Set:  [600 mL] 600 mL PEEP:  [5 cmH20] 5 cmH20 Plateau Pressure:  [16 cmH20-20 cmH20] 20 cmH20   Intake/Output Summary (Last 24 hours) at 07/18/2018 [id number] Last data filed at 07/18/2018 0600 Gross per 24 hour  Intake 1925.5 ml  Output 3200 ml  Net -1274.5 ml   Filed Weights   07/16/18 0500 07/17/18 0500 07/18/18 0500  Weight: 79.1 kg 80.1 kg 79.9 kg    Examination:  General: 63 year old male patient is lying in bed remains unresponsive HEENT normocephalic atraumatic nonicteric sclera no JVD orally intubated Pulmonary: Coarse scattered rhonchi no accessory use equal chest rise on mechanical ventilation Cardiac: Regular rate and rhythm without murmur rub or gallop Abdomen: Soft nontender no organomegaly Extremities: Warm and dry dependent edema  brisk capillary refill Neuro: Unresponsive.  May be some weak withdrawal on the right.  He remains hemiparetic on the left. GU: Clear yellow   Resolved Hospital Problem list   AKI  Aspiration PNA w/ RLL infiltrate (completed 7 d rx) Hypernatremia Status  epilepticus Assessment & Plan:   large right MCA infarct/reperfusion hemorrhage, complicated by acute metabolic encephalopathy?  Alcohol withdrawal contributing, versus acute infection It is felt the acute stroke likely was the cause of status Neurology notes reviewed. -It appears as though he will be significantly debilitated as a consequence of his stroke Plan Continue Trileptal, Depakote, Keppra and Vimpat as directed by neurology  Continue supportive care  I suspect interventional radiology will want to wait until after treatment of C. difficile completed before they would agree to PEG   Acute respiratory failure w/ ventilator dependence d/t on-going encephalopathy, complicated by ventilator associated pneumonia (E-coli and H influenza both (S) to CTX) Portable chest x-ray personally reviewed: This demonstrates endotracheal tube and central access in satisfactory position. He continues to demonstrate right basilar volume loss this appears to be a mix of both atelectasis and possibly effusion Plan Continuing pressure support ventilation but not a candidate for extubation given mental status  VAP bundle  Day #5 of 7 antibiotics, currently on ceftriaxone I will speak to family later today regarding tracheostomy Lasix x1  Hypertension and hyperlipidemia Plan Continue statin therapy Monitor blood pressure  Coronary artery disease status post PCI, left ventricular thrombus on Coumadin, PVD Ejection fraction 35 to 40% in 2014 Plan Continuing Brilinta and telemetry monitoring    Fever, leukocytosis: Improving Plan And CBC and fever curve Continue antibiotics as above  C. difficile colitis Plan We will need to continue oral vancomycin for 10 days past completion of current therapy for antibiotics    Fluid and electrolyte imbalance: Hypokalemia -Slowly improving Plan Continue free water replacement Replace potassium  Hyperglycemia Plan Sliding scale insulin  Best  practice:  Diet: Tube feeds Pain/Anxiety/Delirium protocol (if indicated): Sedation as needed VAP protocol (if indicated): yes DVT prophylaxis: SCDs GI prophylaxis: EPI Glucose control: SSI, sensitive scale Mobility: BR Code Status: Full  Family Communication: We will reach out to daughter today Disposition No improvement.  Currently continuing treatment of pneumonia and C. difficile.  Unfortunately it appears as though he will have minimal if any functional recovery.  I will speak to daughter later today.  Unfortunately it appears as though best case scenario Mr. Shadburn will be headed towards tracheostomy, PEG and skilled nursing facility  Simonne Martinet ACNP-BC [facility] Pulmonary/Critical Care Pager # [phone] OR # [phone] if no answer

## 2018-07-18 NOTE — Progress Notes (Signed)
Neurology Progress Note   S:// Seen and examined.  No acute changes.   O:// Current vital signs: BP (!) 152/88   Pulse 92   Temp 99.5 F (37.5 C) (Axillary)   Resp 16   Ht _0  (1.803 m)   Wt 79.9 kg   SpO2 97%   BMI 24.57 kg/m  Vital signs in last 24 hours: Temp:  [98.5 F (36.9 C)-99.6 F (37.6 C)] 99.5 F (37.5 C) (05/20 0800) Pulse Rate:  [81-97] 92 (05/20 0700) Resp:  [10-16] 16 (05/20 0700) BP: (88-159)/(56-92) 152/88 (05/20 0700) SpO2:  [97 %-100 %] 97 % (05/20 0826) FiO2 (%):  [40 %] 40 % (05/20 0826) Weight:  [79.9 kg] 79.9 kg (05/20 0500) General: Intubated, no sedation HEENT: Normocephalic atraumatic CVs: I6-E7 heard regular rate rhythm Respiratory: Vented Neurological exam Patient's intubated. He opens eyes to noxious stimulation and voice-much more easily today than yesterday. He does not follow any commands It appears that he has a mild right gaze preference Facial symmetry is difficult to ascertain. On noxious stimulation, there is flexion of the right upper, right lower and left lower extremity.  Left upper extremity is rigid and minimal extension noted to noxious stimulation. No twitching or abnormal movements noticed   Medications  Current Facility-Administered Medications:  .  0.9 %  sodium chloride infusion, , Intravenous, PRN, Amie Portland, MD, Stopped at 07/10/18 2153 .  acetaminophen (TYLENOL) solution 650 mg, 650 mg, Per Tube, Q6H PRN, Rigoberto Noel, MD, 650 mg at 07/16/18 0412 .  albuterol (PROVENTIL) (2.5 MG/3ML) 0.083% nebulizer solution 2.5 mg, 2.5 mg, Nebulization, Q4H PRN, Jennelle Human B, NP .  atorvastatin (LIPITOR) tablet 80 mg, 80 mg, Per Tube, q1800, Collier Bullock, MD, 80 mg at 07/17/18 1732 .  bisacodyl (DULCOLAX) suppository 10 mg, 10 mg, Rectal, Daily PRN, Jennelle Human B, NP .  cefTRIAXone (ROCEPHIN) 2 g in sodium chloride 0.9 % 100 mL IVPB, 2 g, Intravenous, Q24H, Erick Colace, NP, Stopped at 07/17/18 1226 .   chlorhexidine gluconate (MEDLINE KIT) (PERIDEX) 0.12 % solution 15 mL, 15 mL, Mouth Rinse, BID, Amie Portland, MD, 15 mL at 07/17/18 1917 .  Chlorhexidine Gluconate Cloth 2 % PADS 6 each, 6 each, Topical, Daily, Rigoberto Noel, MD, 6 each at 07/17/18 0041 .  docusate (COLACE) 50 MG/5ML liquid 100 mg, 100 mg, Per Tube, BID PRN, Jennelle Human B, NP .  enoxaparin (LOVENOX) injection 40 mg, 40 mg, Subcutaneous, Q24H, Sood, Vineet, MD, 40 mg at 07/16/18 1001 .  feeding supplement (OSMOLITE 1.5 CAL) liquid 1,000 mL, 1,000 mL, Per Tube, Continuous, Rigoberto Noel, MD, Last Rate: 45 mL/hr at 07/17/18 2120, 1,000 mL at 07/17/18 2120 .  feeding supplement (PRO-STAT SUGAR FREE 64) liquid 30 mL, 30 mL, Oral, BID, Rigoberto Noel, MD, 30 mL at 07/17/18 2118 .  fentaNYL (SUBLIMAZE) injection 50-100 mcg, 50-100 mcg, Intravenous, Q30 min PRN, Rigoberto Noel, MD, 50 mcg at 07/08/18 0452 .  folic acid (FOLVITE) tablet 1 mg, 1 mg, Per Tube, Daily, Chesley Mires, MD, 1 mg at 07/17/18 0902 .  free water 200 mL, 200 mL, Per Tube, Q8H, Salvadore Dom E, NP, 200 mL at 07/18/18 0540 .  furosemide (LASIX) injection 40 mg, 40 mg, Intravenous, Once, Salvadore Dom E, NP .  insulin aspart (novoLOG) injection 0-9 Units, 0-9 Units, Subcutaneous, Q4H, Jennelle Human B, NP, 1 Units at 07/13/18 0810 .  lacosamide (VIMPAT) 200 mg in sodium chloride 0.9 % 25 mL IVPB, 200  mg, Intravenous, Q12H, Kerney Elbe, MD, Last Rate: 90 mL/hr at 07/18/18 0600 .  levETIRAcetam (KEPPRA) IVPB 1500 mg/ 100 mL premix, 1,500 mg, Intravenous, Q12H, Greta Doom, MD, Stopped at 07/18/18 0448 .  LORazepam (ATIVAN) injection 1-2 mg, 1-2 mg, Intravenous, Q1H PRN, Rigoberto Noel, MD .  MEDLINE mouth rinse, 15 mL, Mouth Rinse, 10 times per day, Amie Portland, MD, 15 mL at 07/18/18 0540 .  multivitamin with minerals tablet 1 tablet, 1 tablet, Per Tube, Daily, Chesley Mires, MD, 1 tablet at 07/17/18 0902 .  Oxcarbazepine (TRILEPTAL) tablet 600 mg, 600  mg, Per Tube, BID, Greta Doom, MD, 600 mg at 07/17/18 2118 .  pantoprazole sodium (PROTONIX) 40 mg/20 mL oral suspension 40 mg, 40 mg, Per Tube, Q1200, Rigoberto Noel, MD, 40 mg at 07/17/18 1159 .  potassium chloride 20 MEQ/15ML (10%) solution 40 mEq, 40 mEq, Per Tube, Q4H, Salvadore Dom E, NP .  sodium chloride flush (NS) 0.9 % injection 10-40 mL, 10-40 mL, Intracatheter, Q12H, Chesley Mires, MD, 10 mL at 07/18/18 0028 .  sodium chloride flush (NS) 0.9 % injection 10-40 mL, 10-40 mL, Intracatheter, PRN, Chesley Mires, MD .  thiamine (VITAMIN B-1) tablet 100 mg, 100 mg, Per Tube, Daily, Chesley Mires, MD, 100 mg at 07/17/18 0903 .  ticagrelor (BRILINTA) tablet 90 mg, 90 mg, Per Tube, BID, Collier Bullock, MD, 90 mg at 07/17/18 2118 .  valproate (DEPACON) 750 mg in dextrose 5 % 50 mL IVPB, 750 mg, Intravenous, Q4H, Amie Portland, MD, Last Rate: 57.5 mL/hr at 07/18/18 0600 .  vancomycin (VANCOCIN) 50 mg/mL oral solution 125 mg, 125 mg, Oral, QID, Erick Colace, NP, 125 mg at 07/18/18 0030 Labs CBC    Component Value Date/Time   WBC 11.5 (H) 07/18/2018 0430   RBC 3.55 (L) 07/18/2018 0430   HGB 10.5 (L) 07/18/2018 0430   HCT 33.0 (L) 07/18/2018 0430   PLT 330 07/18/2018 0430   MCV 93.0 07/18/2018 0430   MCH 29.6 07/18/2018 0430   MCHC 31.8 07/18/2018 0430   RDW 14.6 07/18/2018 0430   LYMPHSABS 1.0 07/08/2018 0329   MONOABS 2.9 (H) 07/08/2018 0329   EOSABS 0.1 07/08/2018 0329   BASOSABS 0.0 07/08/2018 0329    CMP     Component Value Date/Time   NA 139 07/18/2018 0430   K 3.3 (L) 07/18/2018 0430   CL 103 07/18/2018 0430   CO2 26 07/18/2018 0430   GLUCOSE 113 (H) 07/18/2018 0430   BUN 14 07/18/2018 0430   CREATININE 0.60 (L) 07/18/2018 0430   CALCIUM 8.0 (L) 07/18/2018 0430   PROT 5.8 (L) 07/18/2018 0430   ALBUMIN 1.6 (L) 07/18/2018 0430   AST 37 07/18/2018 0430   ALT 22 07/18/2018 0430   ALKPHOS 42 07/18/2018 0430   BILITOT 0.2 (L) 07/18/2018 0430   GFRNONAA >60  07/18/2018 0430   GFRAA >60 07/18/2018 0430  No new imaging to review  Assessment: 63 year old man history of seizures not on antiepileptics admitted on Jul 02, 2018 with status epilepticus from [facility].  Initial LTM with multiple electrographic seizures with interictal epileptiform discharge on the left, treated with antiepileptics and sedation.  Has been off sedation for many days now. MRI done later revealed a large right MCA infarct and small left cortical infarcts which are probably the etiology of the seizure and status epilepticus. Patient also currently has C. difficile infection for which she is being treated. On examination he just opens eyes to voice  but does not follow any commands and withdraws 3 out of 4 extremities to noxious stimulation. I had a detailed discussion with his daughter documented in my separate note from today. I get a sense that the family would want to give him more time prior to making any decisions about further goals of care. I do not disagree with giving him more time since his current neurological condition is also confounded by infection and prolonged hospitalization and given time he might show some improvement.  EEG was reviewed yesterday- no active seizure activity.  Recommendations: For seizures and encephalopathy: Trileptal 600 twice daily Depakote has been reduced to 750 4 times daily and the goal levels for him should be less than 40 given his hypoalbuminemia. Keppra 1.5 g twice daily Vimpat 200 twice daily Management of C. difficile per primary team as you are  From a stroke prevention standpoint -Continue antiplatelets -Will need outpatient 30-day cardiac monitoring on discharge if A. fib is not detected on inpatient evaluation.  From a disposition standpoint- PCCM will have further discussions with family regarding trach and PEG.  -- Amie Portland, MD [facility] Pager: [phone] If 7pm to 7am, please call  on call as listed on AMION.  CRITICAL CARE ATTESTATION Performed by: Amie Portland, MD Total critical care time: 45 minutes Critical care time was exclusive of separately billable procedures and treating other patients and/or supervising APPs/Residents/Students Critical care was necessary to treat or prevent imminent or life-threatening deterioration due to seizures, stroke  This patient is critically ill and at significant risk for neurological worsening and/or death and care requires constant monitoring. Critical care was time spent personally by me on the following activities: development of treatment plan with patient and/or surrogate as well as nursing, discussions with consultants, evaluation of patient's response to treatment, examination of patient, obtaining history from patient or surrogate, ordering and performing treatments and interventions, ordering and review of laboratory studies, ordering and review of radiographic studies, pulse oximetry, re-evaluation of patient's condition, participation in multidisciplinary rounds and medical decision making of high complexity in the care of this patient.

## 2018-07-18 NOTE — Progress Notes (Signed)
Spoke w/ family via conference call. This includes his daughter and sister. They understand current situation. They are holding onto the idea that 30 days will be the magic number. They want to proceed with trach and PEG.   Simonne Martinet ACNP-BC [facility] Pulmonary/Critical Care Pager # [phone] OR # [phone] if no answer

## 2018-07-18 NOTE — Progress Notes (Signed)
I spoke with the patient's daughter, Karene Fry over the phone. We had a detailed discussion about his clinical course up until now, his current clinical exam as last documented by me and discussions about expectations going forward. I explained to them that he has not shown a lot of meaningful responses on the neurological exam. The caveat to this has been that he does have a C. difficile infection and has been on sedation for seizure control, which has now been off for a few days.  I told her that I would have expected him to turn around and show some meaningful responses but that has not been the case. She told me that she has been estranged with her father but would like to come see him along with some other family members who have been helping her with decision-making if possible.  I will try to run this with the critical care unit charge nurse to see if they are willing to make an exception to the current restrictions for visitors due to the ongoing COVID-19 pandemic and allow the family to come see him. Critical care team will also be in touch with the family regarding tracheostomy/PEG placement-and based upon my conversation it seems like that they might be leaning towards giving him some more time to see how he does. I have relayed my plan to Anders Simmonds, NP of the PCCM team over the phone.  -- Milon Dikes, MD [facility] Pager: [phone] If 7pm to 7am, please call on call as listed on AMION.

## 2018-07-18 NOTE — Progress Notes (Addendum)
63 year old with alcohol abuse presented 5/4 with witnessed seizures and status epilepticus, intubated for airway protection, further work-up showed large right MCA CVA  He required burst suppression with Versed and propofol Course complicated by prolonged mechanical ventilation, C. difficile colitis and gram-negative pneumonia  Remains critically ill, intubated, afebrile last 24 hours He opens eyes to name but does not follow commands, hemiplegic on left  coarse scattered rhonchi, soft nontender abdomen, loose stools continue with Flexi-Seal  Labs show improved sodium, mild hypokalemia, low albumin 1.6, improved leukocytosis. Valproate level is normal.  Impression/plan  Status epilepticus-resolved, continue Trileptal, Depakote, Keppra and Vimpat per neurology, remains encephalopathic due to residual sedation versus effect of seizure  Acute encephalopathy/large right MCA CVA with left hemiplegia -prognosis unclear, mental status is improved mildly but still does not follow commands so his rehab potential is unclear  Acute respiratory failure-does not tolerate weaning but not ready for extubation yet, if we are to proceed forward he will need tracheostomy. Further discussion pending with family regarding goals of care here  E. coli/H. influenzae HCAP-continue ceftriaxone for 7 days until 5/23 C. difficile colitis -oral vancomycin for 10 days past 5/23  Hypoalemia will be repleted Overall appears very deconditioned  The patient is critically ill with multiple organ systems failure and requires high complexity decision making for assessment and support, frequent evaluation and titration of therapies, application of advanced monitoring technologies and extensive interpretation of multiple databases. Critical Care Time devoted to patient care services described in this note independent of APP/resident  time is 35 minutes.   Cyril Mourning MD. Tonny Bollman. [place] Pulmonary & Critical care Pager [phone] If no response call [phone]   07/18/2018

## 2018-07-18 NOTE — Progress Notes (Signed)
RN called and spoke with pt's daughter, Edward Cooper.  Edward Cooper is OK with proceeding with trach/peg tomorrow on 5/21.  Consent obtained via telephone and verified with Carilyn Goodpasture, RN.  Bedside RN answered all the daughter's questions and gave her some sense of comfort.  Will continue to support family and pt at this time.  Consents in pt's chart.

## 2018-07-19 ENCOUNTER — Inpatient Hospital Stay (HOSPITAL_COMMUNITY): Payer: Medicare Other

## 2018-07-19 LAB — BASIC METABOLIC PANEL
Anion gap: 10 (ref 5–15)
BUN: 11 mg/dL (ref 8–23)
CO2: 23 mmol/L (ref 22–32)
Calcium: 8 mg/dL — ABNORMAL LOW (ref 8.9–10.3)
Chloride: 105 mmol/L (ref 98–111)
Creatinine, Ser: 0.59 mg/dL — ABNORMAL LOW (ref 0.61–1.24)
GFR calc Af Amer: >60 mL/min (ref >60)
GFR calc non Af Amer: >60 mL/min (ref >60)
Glucose, Bld: 93 mg/dL (ref 70–99)
Potassium: 3.8 mmol/L (ref 3.5–5.1)
Sodium: 138 mmol/L (ref 135–145)

## 2018-07-19 LAB — GLUCOSE, CAPILLARY
Glucose-Capillary: 84 mg/dL (ref 70–99)
Glucose-Capillary: 85 mg/dL (ref 70–99)
Glucose-Capillary: 101 mg/dL — ABNORMAL HIGH (ref 70–99)
Glucose-Capillary: 80 mg/dL (ref 70–99)
Glucose-Capillary: 117 mg/dL — ABNORMAL HIGH (ref 70–99)
Glucose-Capillary: 94 mg/dL (ref 70–99)

## 2018-07-19 LAB — VALPROIC ACID LEVEL: Valproic Acid Lvl: 51 ug/mL (ref 50.0–100.0)

## 2018-07-19 MED ORDER — FUROSEMIDE 10 MG/ML IJ SOLN
40.0000 mg | Freq: Once | INTRAMUSCULAR | Status: AC
  Administered 2018-07-19: 40 mg via INTRAVENOUS
  Filled 2018-07-19: qty 4

## 2018-07-19 MED ORDER — POTASSIUM CHLORIDE 20 MEQ/15ML (10%) PO SOLN
40.0000 meq | Freq: Once | ORAL | Status: AC
  Administered 2018-07-19: 09:00:00 40 meq
  Filled 2018-07-19: qty 30

## 2018-07-19 NOTE — Progress Notes (Signed)
Occupational Therapy Treatment Patient Details Name: Edward Cooper MRN: [id number] DOB: 1955-05-30 Today's Date: 07/19/2018    History of present illness Pt is a 63 y.o. M with significant PMH of CAD s/p PCI, PVD who presents with witnessed seizures x 3, requiring intubation for airway protection on 5/4. Imaging on 5/12 showing extensive right hemisphere infarct, right MCA territory both acute and subacute with areas of reperfusion hemorrhage affecting cortex and basal ganglia. Some extra axial hemorrhage on the right.    OT comments  Pt continues to be minimally responsive.  Today he withdrawals to pain all 4 extremities.  He will flutter eyes briefly in response to position change when bed moved to chair position, and blinks to threat.  He does not follow commands, no tracking noted.  He demonstrates moderate flexor spasticity all 4 extremities.  PROM performed all 4 extremities.   Follow Up Recommendations  SNF;LTACH    Equipment Recommendations  None recommended by OT    Recommendations for Other Services      Precautions / Restrictions Precautions Precautions: Other (comment) Precaution Comments: rectal tube, ETT `       Mobility Bed Mobility               General bed mobility comments: Pt requires total A for all aspects of bed mobility and is unable to assist   Transfers                 General transfer comment: Unable     Balance                                           ADL either performed or assessed with clinical judgement   ADL                                         General ADL Comments: Pt requires totals A for all aspects of ADLs      Vision   Additional Comments: Pt blinks to threat bil. He will flutter eyes open intermittently and in relation to positional change when bed moved into chair position    Perception     Praxis      Cognition Arousal/Alertness: Lethargic Behavior During Therapy:  (minimally responsive ) Overall Cognitive Status: Impaired/Different from baseline                                 General Comments: Pt minimally responsive.  He does not follow commands.  He will briefly open to position change (bed moved into chair position).  Demonstrates flexor response to pain all 4 extremeties         Exercises Exercises: General Upper Extremity;General Lower Extremity General Exercises - Upper Extremity Shoulder Flexion: PROM;Both;10 reps Shoulder ABduction: PROM;Right;Both;10 reps;Supine Shoulder ADduction: PROM;Left;Right;10 reps;Supine Shoulder Horizontal ABduction: PROM;Both;10 reps Shoulder Horizontal ADduction: PROM;Both;10 reps Elbow Flexion: PROM;Both;10 reps Elbow Extension: PROM;Both;10 reps Wrist Flexion: PROM;Both;10 reps Wrist Extension: PROM;Right;Left;10 reps;Supine Digit Composite Flexion: PROM;Right;Left;10 reps;Supine Composite Extension: PROM;Right;Left;10 reps General Exercises - Lower Extremity Ankle Circles/Pumps: PROM;Both;10 reps Heel Slides: PROM;Both;10 reps Hip ABduction/ADduction: PROM;Both;10 reps   Shoulder Instructions       General Comments VSS    Pertinent Vitals/ Pain  Pain Assessment: Faces Faces Pain Scale: No hurt  Home Living                                          Prior Functioning/Environment              Frequency  Min 2X/week        Progress Toward Goals  OT Goals(current goals can now be found in the care plan section)  Progress towards OT goals: Progressing toward goals     Plan Discharge plan remains appropriate    Co-evaluation                 AM-PAC OT "6 Clicks" Daily Activity     Outcome Measure   Help from another person eating meals?: Total Help from another person taking care of personal grooming?: Total Help from another person toileting, which includes using toliet, bedpan, or urinal?: Total Help from another person bathing  (including washing, rinsing, drying)?: Total Help from another person to put on and taking off regular upper body clothing?: Total Help from another person to put on and taking off regular lower body clothing?: Total 6 Click Score: 6    End of Session Equipment Utilized During Treatment: Oxygen  OT Visit Diagnosis: Cognitive communication deficit (R41.841) Symptoms and signs involving cognitive functions: Cerebral infarction   Activity Tolerance Patient limited by lethargy(minimally responsive )   Patient Left in bed   Nurse Communication Mobility status        Time: [id number] OT Time Calculation (min): 26 min  Charges: OT General Charges $OT Visit: 1 Visit OT Treatments $Neuromuscular Re-education: 23-37 mins  [name], OTR/L Acute Rehabilitation Services Pager [phone] Office [phone]    Jeani HawkingConarpe, [name] 07/19/2018, 12:33 PM

## 2018-07-19 NOTE — Progress Notes (Signed)
Patient ID: Edward Cooper, male   DOB: 1955/11/16, 63 y.o.   MRN: [id number]   Aware of percutaneous gastric tube placement request  LD Brilinta 5/20 For trach 5/25  Getting CT to evaluate anatomy for perc G tube candidacy  Will evaluate and plan  after CT

## 2018-07-19 NOTE — Progress Notes (Addendum)
NAME:  Edward Cooper, MRN:  [id number], DOB:  1956/01/15, LOS: 17 ADMISSION DATE:  07/16/2018, CONSULTATION DATE:  07/08/2018 REFERRING MD:  [facility], CHIEF COMPLAINT:  seizures  Brief History   15 yoM presenting with witnessed seizures x 3, did not return to baseline and required intubation for airway protection.  Transferred from [facility] to [facility] for continuous EEG.    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  5/12 weaning versed and up-titrating propofol in hopes of burst suppression.  5/16: No further seizures.  Remains comatose however tolerating pressure support ventilation, starting antibiotics (cefepime and vancomycin) for temperature and left lower lobe pneumonia 5/17 looks about the same  5/18 eyes open, does not track, antibiotics narrowed to ceftriaxone 5/19: New twitching of left leg, EEG ordered 5/20: EEG negative for seizure.  White blood cell count improving.  No fever spikes. 5/21 Brilinta stopped; Trach planned for 5/25 now  Consults:  Neurology   Procedures:  5/4 ETT >> 5/13 Rt PICC >>   Significant Diagnostic Tests:  5/4 [facility] >> chronic changes, no acute abnormality 5/5 EEG >>abnormal, intermittent left hemispheric sharp waves.   LTM EEG 5/5 >> multiple seizures arising from left paracentral frontocentral and central parietal cortex. 5/7 left PLEDs 5/10 EEG Left frontocentral central parietal poorly formed sharply still present suggestive of some degree of cortical irritability in that region.   5/11 EEG  Occasionally left frontocentral sharp waves and spikes present suggestive of some  degree of cortical irritability. No clinical or subclinical seizures present throughout the recording.  5/12 MRI >> Extensive RIGHT hemisphere infarct, RIGHT MCA territory, both acute and subacute, with areas of reperfusion hemorrhage affecting not only cortex but the basal ganglia.Some extra-axial hemorrhage on the RIGHT  Micro Data:  5/4 MRSA PCR  >>neg 5/5 respiratory >> neg 5/14 C diff Ag POS, PCR positive 5/16:Sputum: H influenza as well as E. coli both sensitive to ceftriaxone  Antimicrobials:  5/5 unasyn > 5/11 5/14 vancomycin oral >> 5/16: Vancomycin>>> 5/18 5/16: Cefepime>>> 5/18 Ceftriaxone 5/18 Interim history/subjective:  No fever spikes no issues overnight  Objective   Blood pressure (Abnormal) 147/88, pulse 91, temperature 98.2 F (36.8 C), temperature source Axillary, resp. rate 16, height 5\' 11"  (1.803 m), weight 79.9 kg, SpO2 99 %.    Vent Mode: PRVC FiO2 (%):  [40 %] 40 % Set Rate:  [16 bmp] 16 bmp Vt Set:  [600 mL] 600 mL PEEP:  [5 cmH20] 5 cmH20 Plateau Pressure:  [15 cmH20-20 cmH20] 17 cmH20   Intake/Output Summary (Last 24 hours) at 07/19/2018 0747 Last data filed at 07/19/2018 8341 Gross per 24 hour  Intake 1381.24 ml  Output 5920 ml  Net -4538.76 ml   Filed Weights   07/16/18 0500 07/17/18 0500 07/18/18 0500  Weight: 79.1 kg 80.1 kg 79.9 kg    Examination:   General 63 year old male patient he is lying in bed.  He did open his eyes to verbal request today he is not focusing nor following commands HEENT normocephalic atraumatic no JVD sclera nonicteric orally intubated Pulmonary: Clear to auscultation diminished in the bases  no accessory use on mechanically assisted breath Cardiac: Regular rate and rhythm without murmur rub or gallop Abdomen: Soft, not tender.  Continues to have liquid stool he has a Flexi-Seal in place New: Clear yellow Neuro: Eyes open to verbal stimulation.  Some weak withdrawal on the right only.  Hemiparetic on the left.  GU: Clear yellow.  Resolved Hospital Problem list   AKI  Aspiration PNA w/ RLL infiltrate (completed 7 d rx) Hypernatremia Status epilepticus Assessment & Plan:   large right MCA infarct/reperfusion hemorrhage, complicated by acute metabolic encephalopathy?  Alcohol withdrawal contributing, versus acute infection It is felt the acute stroke likely was the cause of status Neurology notes reviewed. -It appears as though he will be significantly debilitated as a consequence of his stroke Plan Continuing Trileptal, Depakote, Keppra and Vimpat this has been directed by neurology Will place interventional radiology consult for PEG Disposition will be skilled nursing facility following liberation from ventilator   Acute respiratory failure w/ ventilator dependence d/t on-going encephalopathy, complicated by ventilator associated pneumonia (E-coli and H influenza both (S) to CTX) Portable chest x-ray was personally reviewed on 5/21:This demonstrates the PICC line and endotracheal tube are both in satisfactory position.  Aeration is improved with diuresis ordered yesterday.  Continues to have some basilar volume loss however effusions have improved He remains 9.6 L positive Plan Continuing pressure support ventilation as tolerated.  He is planned for tracheostomy later this morning I suspect he will be liberated from the ventilator quite rapidly after this  Continue VAP bundle  Repeating Lasix today  Day #6 of 7 antibiotics currently on ceftriaxone    Hypertension and hyperlipidemia Plan Monitoring blood pressure, continuing statin therapy  Coronary artery disease status post PCI, left ventricular thrombus on Coumadin, PVD Ejection fraction 35 to 40% in 2014 Plan Continuing Brilinta Continue telemetry monitoring  Fever, leukocytosis: Improving Plan Repeat CBC in a.m.  C. difficile colitis Plan The plan will be to continue oral vancomycin for 10-day course after completing  ceftriaxone   Fluid and electrolyte imbalance: He remains volume overloaded, potassium and water balance have improved Plan Continue free water replacement IV Lasix again for pleural effusions We will go ahead and replace potassium given Lasix administration  Hyperglycemia Plan Sliding scale insulin currently has excellent control Best practice:  Diet: Tube feeds Pain/Anxiety/Delirium protocol (if indicated): Sedation as needed VAP protocol (if indicated): yes DVT prophylaxis: SCDs GI prophylaxis: EPI Glucose control: SSI, sensitive scale Mobility: BR Code Status: Full  Family Communication: We will reach out to daughter today Disposition Clinically he looks exactly the same as he did on 5/20.  We will plan for tracheostomy on 25th as on Brilinta (have now stopped).  Will place consult with interventional radiology for PEG tube.  I suspect he will come off the ventilator fairly rapidly after his tracheostomy is in place.  Have placed social work consult for skilled nursing facility  Simonne MartinetPeter E Melessa Cowell ACNP-BC [facility] Pulmonary/Critical Care Pager # [phone] OR # [phone] if no answer

## 2018-07-19 NOTE — Progress Notes (Addendum)
Neurology Progress Note   S:// No acute changes.   O:// Current vital signs: BP (!) 147/88   Pulse 91   Temp 98.2 F (36.8 C) (Axillary)   Resp 16   Ht 5' 11"  (1.803 m)   Wt 79.9 kg   SpO2 100%   BMI 24.57 kg/m  Vital signs in last 24 hours: Temp:  [98.2 F (36.8 C)-99.9 F (37.7 C)] 98.2 F (36.8 C) (05/21 0800) Pulse Rate:  [81-105] 91 (05/21 0700) Resp:  [16] 16 (05/21 0700) BP: (100-159)/(68-97) 147/88 (05/21 0700) SpO2:  [92 %-100 %] 100 % (05/21 0803) FiO2 (%):  [40 %] 40 % (05/21 0803) Gen: Intubated, no sedation HEENT: [place] AT CVS: S1S2+, rrr Resp: vented, satruation normally. Normal breath sounds Abd: ND NT NEUROLOGICAL Intubated Not sedated Opens eyes to nox stim but not to voice. Gaze midline Questionable blink to threat from the right. Possibly from the left as well. Does not follow any commands. PERRL +corneals +oculocephalics +gag/cough Breathing over the vent To mild nox stim on the traps, attempts withdrawal of the RUE. Not much on LUE. Brisk withdrawal of both lower extremities to noxious stimulation.    Medications  Current Facility-Administered Medications:  .  0.9 %  sodium chloride infusion, , Intravenous, PRN, Amie Portland, MD, Last Rate: 10 mL/hr at 07/19/18 0600 .  acetaminophen (TYLENOL) solution 650 mg, 650 mg, Per Tube, Q6H PRN, Rigoberto Noel, MD, 650 mg at 07/16/18 0412 .  albuterol (PROVENTIL) (2.5 MG/3ML) 0.083% nebulizer solution 2.5 mg, 2.5 mg, Nebulization, Q4H PRN, Jennelle Human B, NP .  atorvastatin (LIPITOR) tablet 80 mg, 80 mg, Per Tube, q1800, Collier Bullock, MD, 80 mg at 07/18/18 1746 .  bisacodyl (DULCOLAX) suppository 10 mg, 10 mg, Rectal, Daily PRN, Jennelle Human B, NP .  cefTRIAXone (ROCEPHIN) 2 g in sodium chloride 0.9 % 100 mL IVPB, 2 g, Intravenous, Q24H, Erick Colace, NP, Stopped at 07/18/18 1258 .  chlorhexidine gluconate (MEDLINE KIT) (PERIDEX) 0.12 % solution 15 mL, 15 mL, Mouth Rinse, BID, Amie Portland, MD, 15 mL at 07/19/18 0843 .  Chlorhexidine Gluconate Cloth 2 % PADS 6 each, 6 each, Topical, Daily, Rigoberto Noel, MD, 6 each at 07/19/18 0000 .  docusate (COLACE) 50 MG/5ML liquid 100 mg, 100 mg, Per Tube, BID PRN, Jennelle Human B, NP .  enoxaparin (LOVENOX) injection 40 mg, 40 mg, Subcutaneous, Q24H, Sood, Vineet, MD, 40 mg at 07/18/18 1200 .  etomidate (AMIDATE) injection 40 mg, 40 mg, Intravenous, Once, Kara Mead V, MD .  feeding supplement (OSMOLITE 1.5 CAL) liquid 1,000 mL, 1,000 mL, Per Tube, Continuous, Rigoberto Noel, MD, Stopped at 07/19/18 0000 .  feeding supplement (PRO-STAT SUGAR FREE 64) liquid 30 mL, 30 mL, Oral, BID, Rigoberto Noel, MD, 30 mL at 07/18/18 2124 .  fentaNYL (SUBLIMAZE) injection 200 mcg, 200 mcg, Intravenous, Once, Kara Mead V, MD .  fentaNYL (SUBLIMAZE) injection 50-100 mcg, 50-100 mcg, Intravenous, Q30 min PRN, Rigoberto Noel, MD, 50 mcg at 07/08/18 0452 .  folic acid (FOLVITE) tablet 1 mg, 1 mg, Per Tube, Daily, Chesley Mires, MD, 1 mg at 07/18/18 0956 .  free water 200 mL, 200 mL, Per Tube, Q8H, Erick Colace, NP, 200 mL at 07/19/18 0516 .  insulin aspart (novoLOG) injection 0-9 Units, 0-9 Units, Subcutaneous, Q4H, Simpson, Paula B, NP, 1 Units at 07/18/18 1226 .  lacosamide (VIMPAT) 200 mg in sodium chloride 0.9 % 25 mL IVPB, 200 mg, Intravenous, Q12H, Lindzen,  Randall Hiss, MD, Last Rate: 90 mL/hr at 07/19/18 0605, 200 mg at 07/19/18 0605 .  levETIRAcetam (KEPPRA) IVPB 1500 mg/ 100 mL premix, 1,500 mg, Intravenous, Q12H, Greta Doom, MD, Stopped at 07/19/18 [phone] .  LORazepam (ATIVAN) injection 1-2 mg, 1-2 mg, Intravenous, Q1H PRN, Rigoberto Noel, MD .  MEDLINE mouth rinse, 15 mL, Mouth Rinse, 10 times per day, Amie Portland, MD, 15 mL at 07/19/18 0516 .  midazolam (VERSED) injection 5 mg, 5 mg, Intravenous, Once, Rigoberto Noel, MD .  multivitamin with minerals tablet 1 tablet, 1 tablet, Per Tube, Daily, Chesley Mires, MD, 1 tablet at 07/18/18  0956 .  Oxcarbazepine (TRILEPTAL) tablet 600 mg, 600 mg, Per Tube, BID, Greta Doom, MD, 600 mg at 07/18/18 2124 .  pantoprazole sodium (PROTONIX) 40 mg/20 mL oral suspension 40 mg, 40 mg, Per Tube, Q1200, Rigoberto Noel, MD, 40 mg at 07/18/18 1227 .  propofol (DIPRIVAN) 10 mg/mL bolus/IV push 500 mg, 500 mg, Intravenous, Once, Kara Mead V, MD .  sodium chloride flush (NS) 0.9 % injection 10-40 mL, 10-40 mL, Intracatheter, Q12H, Chesley Mires, MD, 10 mL at 07/18/18 2125 .  sodium chloride flush (NS) 0.9 % injection 10-40 mL, 10-40 mL, Intracatheter, PRN, Chesley Mires, MD .  thiamine (VITAMIN B-1) tablet 100 mg, 100 mg, Per Tube, Daily, Chesley Mires, MD, 100 mg at 07/18/18 0956 .  valproate (DEPACON) 750 mg in dextrose 5 % 50 mL IVPB, 750 mg, Intravenous, Q4H, Amie Portland, MD, Last Rate: 57.5 mL/hr at 07/19/18 0600 .  vancomycin (VANCOCIN) 50 mg/mL oral solution 125 mg, 125 mg, Oral, QID, Erick Colace, NP, 125 mg at 07/18/18 2125 .  vecuronium (NORCURON) injection 10 mg, 10 mg, Intravenous, Once, Rigoberto Noel, MD Labs CBC    Component Value Date/Time   WBC 11.5 (H) 07/18/2018 0430   RBC 3.55 (L) 07/18/2018 0430   HGB 10.5 (L) 07/18/2018 0430   HCT 33.0 (L) 07/18/2018 0430   PLT 330 07/18/2018 0430   MCV 93.0 07/18/2018 0430   MCH 29.6 07/18/2018 0430   MCHC 31.8 07/18/2018 0430   RDW 14.6 07/18/2018 0430   LYMPHSABS 1.0 07/08/2018 0329   MONOABS 2.9 (H) 07/08/2018 0329   EOSABS 0.1 07/08/2018 0329   BASOSABS 0.0 07/08/2018 0329    CMP     Component Value Date/Time   NA 138 07/19/2018 0500   K 3.8 07/19/2018 0500   CL 105 07/19/2018 0500   CO2 23 07/19/2018 0500   GLUCOSE 93 07/19/2018 0500   BUN 11 07/19/2018 0500   CREATININE 0.59 (L) 07/19/2018 0500   CALCIUM 8.0 (L) 07/19/2018 0500   PROT 5.8 (L) 07/18/2018 0430   ALBUMIN 1.6 (L) 07/18/2018 0430   AST 37 07/18/2018 0430   ALT 22 07/18/2018 0430   ALKPHOS 42 07/18/2018 0430   BILITOT 0.2 (L)  07/18/2018 0430   GFRNONAA >60 07/19/2018 0500   GFRAA >60 07/19/2018 0500    glycosylated hemoglobin  Lipid Panel     Component Value Date/Time   CHOL 137 07/12/2018 0427   TRIG 203 (H) 07/12/2018 0427   HDL 12 (L) 07/12/2018 0427   CHOLHDL 11.4 07/12/2018 0427   VLDL 41 (H) 07/12/2018 0427   LDLCALC 84 07/12/2018 0427     Imaging I have reviewed images in epic and the results pertinent to this consultation are: MRI of the brain with large right MCA stroke.  Also ischemic foci in the left hemisphere.  Assessment: 62 year old man  history of seizures not on antiepileptics initially admitted on Jul 02, 2018 status epilepticus, initial LTM with multiple left side electrographic seizures along with interictal epileptiform discharges treated with antiepileptics and sedation. Imaging with MRI revealed a large right MCA infarct and small left cortical infarcts that were probably the etiology of the seizure and status epilepticus. Continues to have depressed mentation, with purposeful withdrawal on all extremities except the left upper extremity. Currently also being treated for C. difficile colitis. Does not follow any commands at this time but does withdraw to noxious immolation. Discussion with family has resulted in the decision to do the tracheostomy but due to him being on Brilinta, will have to be postponed for a few days. Family is of the opinion that if he is able to be awake and with some amount of alertness, although with left hemiplegia, still acceptable to continue full scope of care even with disposition being a nursing home placement  Impression: Seizures/status epilepticus-resolved Toxic metabolic encephalopathy Large right MCA stroke  Recommendations: From a seizure and encephalopathy perspective: Continue the current antiepileptics.  Specifically for valproate, a level around 40 should be therapeutic for him given severe hypoalbuminemia-currently on 750 mg 4 times a  day..  Continue Trileptal 600 twice daily, Keppra 1.5 g twice daily and Vimpat 200 twice daily. Management of C. difficile per primary team as you are  From a stroke prevention standpoint Antiplatelet on hold for possible tracheostomy.  Resume after tracheostomy when okay with the proceduralist. Vessel imaging revealed right ICA occlusion, which is likely the cause of his stroke. No atrial fibrillation.  Might need 30-day heart monitoring as an outpatient and outpatient neurology follow-up  -- Amie Portland, MD [facility] Pager: [phone] If 7pm to 7am, please call on call as listed on AMION.  CRITICAL CARE ATTESTATION Performed by: Amie Portland, MD Total critical care time:30 minutes Critical care time was exclusive of separately billable procedures and treating other patients and/or supervising APPs/Residents/Students Critical care was necessary to treat or prevent imminent or life-threatening deterioration due to seizure, stroke This patient is critically ill and at significant risk for neurological worsening and/or death and care requires constant monitoring. Critical care was time spent personally by me on the following activities: development of treatment plan with patient and/or surrogate as well as nursing, discussions with consultants, evaluation of patient's response to treatment, examination of patient, obtaining history from patient or surrogate, ordering and performing treatments and interventions, ordering and review of laboratory studies, ordering and review of radiographic studies, pulse oximetry, re-evaluation of patient's condition, participation in multidisciplinary rounds and medical decision making of high complexity in the care of this patient.

## 2018-07-19 NOTE — Progress Notes (Signed)
SLP Cancellation Note  Patient Details Name: Edward Cooper MRN: [id number] DOB: May 27, 1955   Cancelled treatment:       Reason Eval/Treat Not Completed: Patient not medically ready. SLP has followed pt since order was placed on 07/11/18 but the pt has been intubated since then. Per medical record, the current plan is for trach and PEG today. SLP will sign off at this time but  please re-consult when/if clinically indicated.   Delshawn Stech I. Vear Clock, MS, CCC-SLP Acute Rehabilitation Services Office number [phone] Pager [phone]  Scheryl Marten 07/19/2018, 7:55 AM

## 2018-07-20 LAB — GLUCOSE, CAPILLARY
Glucose-Capillary: 104 mg/dL — ABNORMAL HIGH (ref 70–99)
Glucose-Capillary: 106 mg/dL — ABNORMAL HIGH (ref 70–99)
Glucose-Capillary: 112 mg/dL — ABNORMAL HIGH (ref 70–99)
Glucose-Capillary: 113 mg/dL — ABNORMAL HIGH (ref 70–99)
Glucose-Capillary: 118 mg/dL — ABNORMAL HIGH (ref 70–99)
Glucose-Capillary: 88 mg/dL (ref 70–99)

## 2018-07-20 LAB — VALPROIC ACID LEVEL: Valproic Acid Lvl: 55 ug/mL (ref 50.0–100.0)

## 2018-07-20 LAB — AMMONIA: Ammonia: 25 umol/L (ref 9–35)

## 2018-07-20 MED ORDER — VALPROATE SODIUM 500 MG/5ML IV SOLN
750.0000 mg | Freq: Four times a day (QID) | INTRAVENOUS | Status: DC
  Administered 2018-07-20 – 2018-07-24 (×16): 750 mg via INTRAVENOUS
  Filled 2018-07-20 (×20): qty 7.5

## 2018-07-20 MED ORDER — VALPROATE SODIUM 500 MG/5ML IV SOLN
500.0000 mg | INTRAVENOUS | Status: DC
  Filled 2018-07-20 (×4): qty 5

## 2018-07-20 NOTE — Social Work (Signed)
Acknowledging consult that pt is service connected.  Pt still not medically stable for disposition discussion, will add to hand off for weekend staff.   CSW continuing to follow for support with disposition when medically appropriate.  Octavio Graves, MSW, [facility] Clinical Social Work [phone]

## 2018-07-20 NOTE — Procedures (Signed)
Cortrak  Tube Type:  Cortrak - 43 inches Tube Location:  Right nare Initial Placement:  Stomach Secured by: Bridle Technique Used to Measure Tube Placement:  Documented cm marking at nare/ corner of mouth Cortrak Secured At:  73 cm    No x-ray is required. RN may begin using tube.   If the tube becomes dislodged please keep the tube and contact the Cortrak team at [email or web address] ([email or web address]) for replacement.  If after hours and replacement cannot be delayed, place a NG tube and confirm placement with an abdominal x-ray.    Vanessa Kick RD, LDN Clinical Nutrition Pager # [phone]

## 2018-07-20 NOTE — Progress Notes (Signed)
NAME:  Edward NashJeffrey D Cooper, MRN:  [id number], DOB:  1955/04/27, LOS: 18 ADMISSION DATE:  07/20/2018, CONSULTATION DATE:  07/23/2018 REFERRING MD:  [facility], CHIEF COMPLAINT:  seizures  Brief History   [age] yoM presenting with witnessed seizures x 3, did not return to baseline and required intubation for airway protection.  [facility]    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  5/12 weaning versed and up-titrating propofol in hopes of burst suppression.  5/16: No further seizures.  Remains comatose however tolerating pressure support ventilation, starting antibiotics (cefepime and vancomycin) for temperature and left lower lobe pneumonia 5/17 looks about the same  5/18 eyes open, does not track, antibiotics narrowed to ceftriaxone 5/19: New twitching of left leg, EEG ordered 5/20: EEG negative for seizure.  White blood cell count improving.  No fever spikes. 5/21 Brilinta stopped; Trach planned for 5/25 now  Consults:  Neurology   Procedures:  5/4 ETT >> 5/13 Rt PICC >>   Significant Diagnostic Tests:  [facility] chronic changes, no acute abnormality 5/5 EEG >>abnormal, intermittent left hemispheric sharp waves.   LTM EEG 5/5 >> multiple seizures arising from left paracentral frontocentral and central parietal cortex. 5/7 left PLEDs 5/10 EEG Left frontocentral central parietal poorly formed sharply still present suggestive of some degree of cortical irritability in that region.   5/11 EEG  Occasionally left frontocentral sharp waves and spikes present suggestive of some  degree of cortical irritability. No clinical or subclinical seizures present throughout the recording.  5/12 MRI >> Extensive RIGHT hemisphere infarct, RIGHT MCA territory, both acute and subacute, with areas of reperfusion hemorrhage affecting not only cortex but the basal ganglia.Some extra-axial hemorrhage on the RIGHT  Micro Data:  5/4 MRSA PCR  >>neg 5/5 respiratory >> neg 5/14 C diff Ag POS, PCR positive 5/16:Sputum: H influenza as well as E. coli both sensitive to ceftriaxone  Antimicrobials:  5/5 unasyn > 5/11 5/14 vancomycin oral >> 5/16: Vancomycin>>> 5/18 5/16: Cefepime>>> 5/18 Ceftriaxone 5/18 Interim history/subjective:  No fever spikes no issues overnight  Objective   Blood pressure (!) 158/79, pulse 96, temperature 99.8 F (37.7 C), temperature source Axillary, resp. rate 14, height 5\' 11"  (1.803 m), weight 80.1 kg, SpO2 98 %.    Vent Mode: CPAP;PSV FiO2 (%):  [40 %] 40 % Set Rate:  [16 bmp] 16 bmp Vt Set:  [600 mL] 600 mL PEEP:  [5 cmH20] 5 cmH20 Pressure Support:  [12 cmH20] 12 cmH20 Plateau Pressure:  [16 cmH20-17 cmH20] 17 cmH20   Intake/Output Summary (Last 24 hours) at 07/20/2018 0917 Last data filed at 07/20/2018 0800 Gross per 24 hour  Intake 2838.97 ml  Output 2525 ml  Net 313.97 ml   Filed Weights   07/17/18 0500 07/18/18 0500 07/20/18 0500  Weight: 80.1 kg 79.9 kg 80.1 kg    Examination:   General 63 year old male patient he is lying in bed. Left eye opens to verbal request today he is not focusing nor following commands HEENT [place]/AT;  no JVD; sclerae anicteric; orally intubated Pulmonary: CTAB; diminished in  the bases, no accessory use on mechanically assisted breath Cardiac: RRR no r/m/g Abdomen: Soft, not tender.  Continues to have liquid stool he has a Flexi-Seal in place Neuro: Eyes open to verbal stimulation.  Some weak withdrawal on the right only. LLE ankle clonus. Hemiparetic on the left. GU: Clear yellow.  Resolved Hospital  Problem list   AKI  Aspiration PNA w/ RLL infiltrate (completed 7 d rx) Hypernatremia Status epilepticus Assessment & Plan:   large right MCA infarct/reperfusion hemorrhage, complicated by acute metabolic encephalopathy?  Alcohol withdrawal contributing, versus acute infection It is felt the acute stroke likely was the cause of status Neurology notes reviewed. -It appears as though he will be significantly debilitated as a consequence of his stroke Plan Continuing Trileptal, Depakote, Keppra and Vimpat this has been directed by neurology Will place interventional radiology consult for PEG Disposition will be skilled nursing facility following liberation from ventilator   Acute respiratory failure w/ ventilator dependence d/t on-going encephalopathy, complicated by ventilator associated pneumonia (E-coli and H influenza both (S) to CTX) Portable chest x-ray 5/21:This demonstrates the PICC line and endotracheal tube are both in satisfactory position.  Aeration is improved with diuresis ordered yesterday.  Continues to have some basilar volume loss however effusions have improved I/O since admission -5L Plan Continuing pressure support ventilation as tolerated. Plan trach 5/25. Day #6 of 7 antibiotics currently on ceftriaxone, PO Vanc for c.diff   Hypertension and hyperlipidemia Plan Monitoring blood pressure, continuing statin therapy  Coronary artery disease status post PCI, left ventricular thrombus on Coumadin, PVD Ejection fraction 35 to 40% in 2014 Plan Brilinta on hold for trach; resume after procedure. Continue telemetry monitoring  Fever, leukocytosis: Improving Plan Repeat CBC in a.m.  C. difficile colitis Plan The plan will be to continue oral vancomycin for 10-day course after completing ceftriaxone   Fluid and electrolyte imbalance: He remains volume overloaded, potassium and water balance have improved Plan Continue free water replacement IV Lasix again for  pleural effusions We will go ahead and replace potassium given Lasix administration  Hyperglycemia Plan Sliding scale insulin currently has excellent control Best practice:  Diet: Tube feeds Pain/Anxiety/Delirium protocol (if indicated): Sedation as needed VAP protocol (if indicated): yes DVT prophylaxis: SCDs GI prophylaxis: EPI Glucose control: SSI, sensitive scale Mobility: BR Code Status: Full  Family Communication: We will reach out to daughter today Disposition:  Have placed social work consult for skilled nursing facility    I have independently seen and examined the patient, reviewed data, and developed an assessment and plan with the APP. A total of 31 minutes were spent in critical care assessment and medical decision making. This critical care time does not reflect procedure time, or teaching time or supervisory time of PA/NP/Med student/Med Resident, etc but could involve care discussion time.  Gwynne Edinger, MD PhD 07/20/18 9:23 AM

## 2018-07-20 NOTE — Consult Note (Signed)
Chief Complaint: Patient was seen in consultation today for percutaneous gastric tube placement at the request of Dr Marthe Patch   Supervising Physician: Richarda Overlie  Patient Status: [facility] - In-pt  History of Present Illness: New Alluwe SHELLHAMMER is a 62 y.o. male   Hx CAD/ LV thrombus and on coumadin-- Brilinta LD Brilinta 5/20- planned for trach placement 5/25  Sz activity- witnessed LTM EEG 5/5 >> multiple seizures arising from left paracentral frontocentral and central parietal cortex.  Transferred from [facility] to [facility] Large right MCA infarct/reperfusion hemorrhage, complicated by acute metabolic encephalopathy?  Alcohol withdrawal contributing, versus acute infection Comatose Malnutrition Dysphagia Deconditioning Long term care Request for percutaneous gastric tube placement  Imaging reviewed and approved for procedure  Cdiff 5/14-- 10 day course antibiotics Afeb Wbc 11.5  Past Medical History:  Diagnosis Date   Alcoholism (HCC)    Coronary artery disease    a. 08/07/12: anterior STEMI s/p DES x 2 to ostial LAD, thrombus extraction of mid LAD - c/b R groin hematoma/focal R femoral thrombus. b.  08/15/12: recurrent acute anterior STEMI with occlusion of LAD stent, (following cessation of integrellin prior to scheduled vascular surgery 08/15/12 for right femoral thrombectomy - later deferred), s/p PTCA/thrombectomy of LAD.    GERD (gastroesophageal reflux disease)    Groin hematoma    a. R groin hematoma post-cath 07/2012, conservative management.   HTN (hypertension)    a. Soft BP 07/2012 limiting med titration.   Hyperlipidemia    Ischemic cardiomyopathy    a. EF 35-40% by echo 07/2012.   LV (left ventricular) mural thrombus    a. By echo 07/2012 at time of STEMI.   NSVT (nonsustained ventricular tachycardia) (HCC)    a. Around time of STEMI 08/07/12.   Seizures (HCC)    Thrombus    a. R femoral, incidentally noted on CT after cath 07/2012 - Focal thrombus within the  distal common femoral artery and involving the proximal superficial femoral artery. Initially planned for surgery but this was deferred after 2nd MI from being off integrillin, DAPT. Med rx, OP f/u vascular.   Tobacco use    Ventricular fibrillation (HCC)    a. In cath lab at time of STEMI #2 08/15/12.    Past Surgical History:  Procedure Laterality Date   CARDIAC CATHETERIZATION     CORONARY ANGIOPLASTY     LEFT HEART CATHETERIZATION WITH CORONARY ANGIOGRAM N/A 08/07/2012   Procedure: LEFT HEART CATHETERIZATION WITH CORONARY ANGIOGRAM;  Surgeon: Peter M Swaziland, MD;  Location: [facility] CATH LAB;  Service: Cardiovascular;  Laterality: N/A;   LEFT HEART CATHETERIZATION WITH CORONARY ANGIOGRAM N/A 08/15/2012   Procedure: LEFT HEART CATHETERIZATION WITH CORONARY ANGIOGRAM;  Surgeon: Kathleene Hazel, MD;  Location: [facility] CATH LAB;  Service: Cardiovascular;  Laterality: N/A;   None     PERCUTANEOUS CORONARY STENT INTERVENTION (PCI-S) N/A 08/07/2012   Procedure: PERCUTANEOUS CORONARY STENT INTERVENTION (PCI-S);  Surgeon: Peter M Swaziland, MD;  Location: [facility] CATH LAB;  Service: Cardiovascular;  Laterality: N/A;   TONSILLECTOMY      Allergies: Patient has no known allergies.  Medications: Prior to Admission medications   Not on File     Family History  Family history unknown: Yes    Social History   Socioeconomic History   Marital status: Divorced    Spouse name: Not on file   Number of children: Not on file   Years of education: Not on file   Highest education level: Not on file  Occupational  History   Occupation: Unemployed  Ecologist strain: Not on file   Food insecurity:    Worry: Not on file    Inability: Not on file   Transportation needs:    Medical: Not on file    Non-medical: Not on file  Tobacco Use   Smoking status: Current Every Day Smoker    Packs/day: 0.50    Years: 40.00    Pack years: 20.00    Types: Cigarettes    Smokeless tobacco: Never Used   Tobacco comment: pt states that he is trying to quit  Substance and Sexual Activity   Alcohol use: Yes    Alcohol/week: 6.0 standard drinks    Types: 6 Cans of beer per week   Drug use: No   Sexual activity: Not on file  Lifestyle   Physical activity:    Days per week: Not on file    Minutes per session: Not on file   Stress: Not on file  Relationships   Social connections:    Talks on phone: Not on file    Gets together: Not on file    Attends religious service: Not on file    Active member of club or organization: Not on file    Attends meetings of clubs or organizations: Not on file    Relationship status: Not on file  Other Topics Concern   Not on file  Social History Narrative   Patient lives alone in a trailer. There is no family history of premature coronary artery disease in either parent or any other siblings.    Review of Systems: A 12 point ROS discussed and pertinent positives are indicated in the HPI above.  All other systems are negative.   Vital Signs: BP (!) 152/95 (BP Location: Left Arm)    Pulse (!) 101    Temp 99.6 F (37.6 C) (Axillary)    Resp 19    Ht 5\' 11"  (1.803 m)    Wt 176 lb 9.4 oz (80.1 kg)    SpO2 98%    BMI 24.63 kg/m   Physical Exam Vitals signs reviewed.  Constitutional:      Appearance: He is ill-appearing.  Cardiovascular:     Rate and Rhythm: Normal rate and regular rhythm.     Heart sounds: Normal heart sounds.  Pulmonary:     Comments: vent Musculoskeletal:     Comments: No movement  Skin:    General: Skin is warm.  Neurological:     Comments: No response  Psychiatric:     Comments: Consented with Dtr Karene Fry     Imaging: Ct Abdomen Wo Contrast  Result Date: 07/19/2018 CLINICAL DATA:  Dysphagia. Evaluate anatomy prior to potential percutaneous gastrostomy tube placement. EXAM: CT ABDOMEN WITHOUT CONTRAST TECHNIQUE: Multidetector CT imaging of the abdomen was performed following  the standard protocol without IV contrast. COMPARISON:  None. FINDINGS: Lower chest: Limited visualization of lower thorax demonstrates trace bilateral effusions and associated bibasilar heterogeneous/consolidative opacities. Normal heart size. Small pericardial effusion. Coronary artery calcifications. Hepatobiliary: Normal hepatic contour. Normal noncontrast appearance of the gallbladder given degree distention. No radiopaque gallstones. No ascites. Pancreas: Normal noncontrast appearance of the pancreas. Spleen: Normal noncontrast appearance of the spleen. Adrenals/Urinary Tract: Normal noncontrast appearance of the bilateral kidneys. No renal stones. There is a minimal amount of grossly symmetric bilateral likely age and body habitus related perinephric stranding. No urinary obstruction. Normal appearance of the bilateral adrenal glands. The urinary bladder was not  imaged. Stomach/Bowel: Enteric tube tip terminates within the gastric antrum. The anterior wall of the stomach is well apposed about the ventral wall of the upper abdomen without interposed liver or transverse colon. No evidence of enteric obstruction. No pneumoperitoneum, pneumatosis or portal venous gas. Vascular/Lymphatic: Atherosclerotic plaque within a normal caliber abdominal aorta. No bulky retroperitoneal, mesenteric or porta hepatis lymphadenopathy on this noncontrast examination. Other: Mild diffuse body wall anasarca most conspicuous about the flanks in midline of the back. Musculoskeletal: No acute or aggressive osseous abnormalities. Moderate DDD of L5-S1 with disc space height loss, endplate irregularity and sclerosis. IMPRESSION: 1. Gastric anatomy amenable to potential percutaneous gastrostomy tube placement as indicated. 2. Small pericardial effusion with trace bilateral effusions and associated bibasilar opacities, atelectasis versus infiltrate. 3. Coronary calcifications.  Aortic Atherosclerosis (ICD10-I70.0). Electronically Signed    By: Simonne Come M.D.   On: 07/19/2018 15:27   Ct Angio Head W Or Wo Contrast  Result Date: 07/16/2018 CLINICAL DATA:  Stroke follow-up.  Right MCA territory infarct. EXAM: CT ANGIOGRAPHY HEAD AND NECK TECHNIQUE: Multidetector CT imaging of the head and neck was performed using the standard protocol during bolus administration of intravenous contrast. Multiplanar CT image reconstructions and MIPs were obtained to evaluate the vascular anatomy. Carotid stenosis measurements (when applicable) are obtained utilizing NASCET criteria, using the distal internal carotid diameter as the denominator. CONTRAST:  50mL OMNIPAQUE IOHEXOL 350 MG/ML SOLN COMPARISON:  CT head without contrast 07/11/2018. MRI brain 07/10/2018 FINDINGS: CT HEAD FINDINGS Brain: There is expected evolution of a right MCA territory infarct. Minimal petechial hemorrhage is evident. No new hemorrhage is noted. There is no extension of the infarct. There is still some mass effect with partial effacement of the sulci and right lateral ventricle. No midline shift is present. Vascular: No hyperdense vessel or unexpected calcification. Skull: Calvarium is intact. No focal lytic or blastic lesions are present. Sinuses: Scattered ethmoid mucosal thickening is present. The paranasal sinuses and mastoid air cells are otherwise clear. The right external auditory canal is occluded. There is moderate cerumen in the left external auditory canal. Orbits: The globes and orbits are within normal limits. Review of the MIP images confirms the above findings CTA NECK FINDINGS Aortic arch: Atherosclerotic calcifications are present at the arch. There is no aneurysm or focal stenosis. There is a common origin of the left common carotid artery in the innominate artery. Right carotid system: Right common carotid artery is within normal limits. A high-grade, near occlusive stenosis is present at the proximal right ICA. The more distal right ICA is smaller than the left. Left  carotid system: The left common carotid artery is unremarkable. Atherosclerotic changes present at the proximal left ICA without a significant stenosis relative to the more distal vessel. Vertebral arteries: The right vertebral artery is the dominant vessel. Both vertebral arteries originate from the subclavian arteries without significant stenosis. Skeleton: Multilevel degenerative changes are present in the cervical spine. Facet hypertrophy is worse right than left, resulting and foraminal narrowing on the right, particularly at C4-5. Vertebral body heights alignment are maintained. Extensive dental caries are present within residual teeth. There is also marked periapical lucency. Other neck: The patient is intubated. NG tube is in place. Thyroid is heterogeneous without a dominant lesion. No significant cervical adenopathy is present. Salivary glands are within normal limits. Upper chest: Bilateral pleural effusions are present. Associated atelectasis is noted. Review of the MIP images confirms the above findings CTA HEAD FINDINGS Anterior circulation: Atherosclerotic calcifications are present within  the cavernous internal carotid arteries bilaterally. There is no significant stenosis from the skull base through the ICA termini. The left A1 segment is dominant. M1 segments are intact. Left MCA bifurcation is intact. Left MCA and bilateral ACA branch vessels are intact. There is some distal small vessel disease. There is generalized attenuation of right MCA branch vessels without a significant proximal stenosis or occlusion. Posterior circulation: Right vertebral artery is dominant. PICA origins are visualized and normal bilaterally. Basilar artery is within normal limits. Both posterior cerebral arteries originate from the basilar tip. There is a moderate proximal left P2 segment stenosis. A left posterior communicating artery contributes. Distal segmental narrowing is present bilaterally. Venous sinuses: Dural  sinuses are patent. Straight sinus and deep cerebral veins are intact. Cortical veins are unremarkable. Anatomic variants: None Delayed phase: There is enhancement of the infarct cortex compatible with luxury perfusion. No mass lesion is present. Review of the MIP images confirms the above findings IMPRESSION: 1. High-grade, near occlusive stenosis of the proximal right internal carotid artery at the bifurcation. 2. Atherosclerotic changes in the left carotid bifurcation without significant stenosis. 3. Atherosclerotic changes at the aortic arch and cavernous internal carotid arteries bilaterally without significant stenosis. 4. Expected evolution of large right MCA infarct with no significant change in minimal petechial hemorrhage in the basal ganglia. 5. Asymmetric attenuation of MCA branch vessels on the right without a significant proximal stenosis or occlusion. 6. Mild diffuse distal medium and small vessel disease within the circle-of-Willis. 7. Moderate proximal left P2 segment stenosis. 8. Multilevel facet degenerative changes in the cervical spine are worse right than left. 9. Extensive dental and periodontal disease. Electronically Signed   By: Marin Robertshristopher  Mattern M.D.   On: 07/16/2018 12:41   Ct Head Wo Contrast  Result Date: 07/11/2018 CLINICAL DATA:  63 year old male who presented with status epilepticus. Acute on chronic right MCA infarct. EXAM: CT HEAD WITHOUT CONTRAST TECHNIQUE: Contiguous axial images were obtained from the base of the skull through the vertex without intravenous contrast. COMPARISON:  Brain MRI 07/10/2018 and earlier. FINDINGS: Brain: Cytotoxic edema throughout much of the right MCA territory corresponding to abnormal diffusion and FLAIR seen yesterday. Partial effacement of the right lateral ventricle and 2-3 millimeters of leftward midline shift are stable. Petechial hemorrhage in the right basal ganglia again noted. No malignant hemorrhagic transformation. No  intraventricular. No discrete extra-axial hemorrhage is evident by CT. No ventriculomegaly. Basilar cisterns remain normal. Stable gray-white matter differentiation in the left hemisphere and posterior fossa. Vascular: Mild Calcified atherosclerosis at the skull base. Skull: No acute osseous abnormality identified. Sinuses/Orbits: Visualized paranasal sinuses and mastoids are stable and well pneumatized. Opacified EACs again noted. Other: Scalp EEG leads in place. No acute orbit or scalp soft tissue findings. IMPRESSION: 1. Stable appearance of large right MCA territory infarct since yesterday. Stable mass effect with 2-3 mm of leftward midline shift and petechial hemorrhage in the right basal ganglia. 2. The trace right parietal convexity extra-axial blood is not evident by CT. 3. No new intracranial abnormality. Electronically Signed   By: Odessa FlemingH  Hall M.D.   On: 07/11/2018 06:31   Ct Head Wo Contrast  Result Date: 07/19/2018 CLINICAL DATA:  Recent seizure activity EXAM: CT HEAD WITHOUT CONTRAST TECHNIQUE: Contiguous axial images were obtained from the base of the skull through the vertex without intravenous contrast. COMPARISON:  11/22/2007 FINDINGS: Brain: Mild atrophic changes are noted. Mild areas of decreased attenuation are noted consistent with encephalomalacia from prior infarcts in the right  posterior parietooccipital region as well as within the right parietal lobe near the vertex. No findings to suggest acute hemorrhage, acute infarction or space-occupying mass lesion are noted. Vascular: No hyperdense vessel or unexpected calcification. Skull: Normal. Negative for fracture or focal lesion. Sinuses/Orbits: No acute finding. Other: None. IMPRESSION: Chronic changes without acute abnormality. Electronically Signed   By: Alcide Clever M.D.   On: 07/06/18 18:35   Ct Angio Neck W Or Wo Contrast  Result Date: 07/16/2018 CLINICAL DATA:  Stroke follow-up.  Right MCA territory infarct. EXAM: CT ANGIOGRAPHY  HEAD AND NECK TECHNIQUE: Multidetector CT imaging of the head and neck was performed using the standard protocol during bolus administration of intravenous contrast. Multiplanar CT image reconstructions and MIPs were obtained to evaluate the vascular anatomy. Carotid stenosis measurements (when applicable) are obtained utilizing NASCET criteria, using the distal internal carotid diameter as the denominator. CONTRAST:  50mL OMNIPAQUE IOHEXOL 350 MG/ML SOLN COMPARISON:  CT head without contrast 07/11/2018. MRI brain 07/10/2018 FINDINGS: CT HEAD FINDINGS Brain: There is expected evolution of a right MCA territory infarct. Minimal petechial hemorrhage is evident. No new hemorrhage is noted. There is no extension of the infarct. There is still some mass effect with partial effacement of the sulci and right lateral ventricle. No midline shift is present. Vascular: No hyperdense vessel or unexpected calcification. Skull: Calvarium is intact. No focal lytic or blastic lesions are present. Sinuses: Scattered ethmoid mucosal thickening is present. The paranasal sinuses and mastoid air cells are otherwise clear. The right external auditory canal is occluded. There is moderate cerumen in the left external auditory canal. Orbits: The globes and orbits are within normal limits. Review of the MIP images confirms the above findings CTA NECK FINDINGS Aortic arch: Atherosclerotic calcifications are present at the arch. There is no aneurysm or focal stenosis. There is a common origin of the left common carotid artery in the innominate artery. Right carotid system: Right common carotid artery is within normal limits. A high-grade, near occlusive stenosis is present at the proximal right ICA. The more distal right ICA is smaller than the left. Left carotid system: The left common carotid artery is unremarkable. Atherosclerotic changes present at the proximal left ICA without a significant stenosis relative to the more distal vessel.  Vertebral arteries: The right vertebral artery is the dominant vessel. Both vertebral arteries originate from the subclavian arteries without significant stenosis. Skeleton: Multilevel degenerative changes are present in the cervical spine. Facet hypertrophy is worse right than left, resulting and foraminal narrowing on the right, particularly at C4-5. Vertebral body heights alignment are maintained. Extensive dental caries are present within residual teeth. There is also marked periapical lucency. Other neck: The patient is intubated. NG tube is in place. Thyroid is heterogeneous without a dominant lesion. No significant cervical adenopathy is present. Salivary glands are within normal limits. Upper chest: Bilateral pleural effusions are present. Associated atelectasis is noted. Review of the MIP images confirms the above findings CTA HEAD FINDINGS Anterior circulation: Atherosclerotic calcifications are present within the cavernous internal carotid arteries bilaterally. There is no significant stenosis from the skull base through the ICA termini. The left A1 segment is dominant. M1 segments are intact. Left MCA bifurcation is intact. Left MCA and bilateral ACA branch vessels are intact. There is some distal small vessel disease. There is generalized attenuation of right MCA branch vessels without a significant proximal stenosis or occlusion. Posterior circulation: Right vertebral artery is dominant. PICA origins are visualized and normal bilaterally. Basilar artery  is within normal limits. Both posterior cerebral arteries originate from the basilar tip. There is a moderate proximal left P2 segment stenosis. A left posterior communicating artery contributes. Distal segmental narrowing is present bilaterally. Venous sinuses: Dural sinuses are patent. Straight sinus and deep cerebral veins are intact. Cortical veins are unremarkable. Anatomic variants: None Delayed phase: There is enhancement of the infarct cortex  compatible with luxury perfusion. No mass lesion is present. Review of the MIP images confirms the above findings IMPRESSION: 1. High-grade, near occlusive stenosis of the proximal right internal carotid artery at the bifurcation. 2. Atherosclerotic changes in the left carotid bifurcation without significant stenosis. 3. Atherosclerotic changes at the aortic arch and cavernous internal carotid arteries bilaterally without significant stenosis. 4. Expected evolution of large right MCA infarct with no significant change in minimal petechial hemorrhage in the basal ganglia. 5. Asymmetric attenuation of MCA branch vessels on the right without a significant proximal stenosis or occlusion. 6. Mild diffuse distal medium and small vessel disease within the circle-of-Willis. 7. Moderate proximal left P2 segment stenosis. 8. Multilevel facet degenerative changes in the cervical spine are worse right than left. 9. Extensive dental and periodontal disease. Electronically Signed   By: Marin Roberts M.D.   On: 07/16/2018 12:41   Mr Brain Wo Contrast  Result Date: 07/10/2018 CLINICAL DATA:  Seizure activity. Status epilepticus. Altered mentation. EXAM: MRI HEAD WITHOUT CONTRAST TECHNIQUE: Multiplanar, multiecho pulse sequences of the brain and surrounding structures were obtained without intravenous contrast. COMPARISON:  Most recent CT head 07/17/2018. FINDINGS: Brain: Extensive areas of restricted diffusion, affecting much of the RIGHT MCA territory including basal ganglia, frontal lobe, temporal lobe, insula, and anterior parietal lobe. In addition, there is reperfusion hemorrhage throughout much of the caudate, putamen, and globus pallidus, also involving portions of the frontal, temporal, and parietal cortex with underlying white matter. These changes are seen best on gradient sequence, series 9, and T1 sequence series 10. There is a small subdural collection of blood over the anterior parietal convexity, see for  instance series 10, image 29. There is an element of the reperfusion hemorrhage which is also subarachnoid, probably best seen on the noncontrast T1 weighted images. 2 mm of RIGHT-to-LEFT shift. Effacement of the RIGHT lateral ventricle. No incipient herniation. Tiny chronic cerebellar infarcts. Premature for age atrophy.  Chronic microvascular ischemic change. Vascular: Intracranial flow voids are preserved. Skull and upper cervical spine: No acute findings. Sinuses/Orbits: Layering fluid in the nasopharynx, but paranasal sinuses are grossly clear. No orbital findings of significance. Other: BILATERAL mastoid effusions, query recumbency. IMPRESSION: Extensive RIGHT hemisphere infarct, RIGHT MCA territory, both acute and subacute, with areas of reperfusion hemorrhage affecting not only cortex but the basal ganglia. Some extra-axial hemorrhage on the RIGHT, anterior parietal convexity, may have predisposed patient to status epilepticus. Based on MR, no proximal large vessel occlusion is evident. Electronically Signed   By: Elsie Stain M.D.   On: 07/10/2018 15:44   Dg Chest Port 1 View  Result Date: 07/19/2018 CLINICAL DATA:  Check endotracheal tube EXAM: PORTABLE CHEST 1 VIEW COMPARISON:  07/18/2018 FINDINGS: Cardiac shadow is stable. Endotracheal tube, nasogastric catheter and right-sided PICC line are again seen and stable. Bilateral pleural effusions are noted right greater than left slightly increased from the prior exam although this may be in part positional in nature. No pneumothorax is seen. Underlying atelectatic changes are noted. IMPRESSION: Bibasilar effusions with underlying atelectatic changes. Electronically Signed   By: Alcide Clever M.D.   On: 07/19/2018  07:38   Dg Chest Port 1 View  Result Date: 07/18/2018 CLINICAL DATA:  Pneumonia EXAM: PORTABLE CHEST 1 VIEW COMPARISON:  Three days ago FINDINGS: Right upper extremity PICC in good position. There is an endotracheal tube with tip 5 cm above  the carina. The orogastric tube at least reaches the stomach. Haziness of the bilateral chest. Worsening aeration on the right with the diaphragm is now obscured. Normal heart size. IMPRESSION: 1. Stable hardware positioning. 2. Worsening right lower lobe aeration. Electronically Signed   By: Marnee Spring M.D.   On: 07/18/2018 10:13   Dg Chest Port 1 View  Result Date: 07/15/2018 CLINICAL DATA:  Acute respiratory failure EXAM: PORTABLE CHEST 1 VIEW COMPARISON:  Jul 12, 2018 FINDINGS: The ETT terminates in good position in the trachea. The OG tube terminates below today's film. The right PICC line is stable. No pneumothorax. New mild opacity in the medial right lung base. Persistent opacity in left base obscuring left hemidiaphragm, mildly improved. IMPRESSION: 1. Support apparatus as above. 2. Mild increased opacity in the right base may represent developing pneumonia or atelectasis. 3. Persistent opacity obscuring left hemidiaphragm in the left base, improved in the interval. Electronically Signed   By: Gerome Sam III M.D   On: 07/15/2018 11:26   Dg Chest Port 1 View  Result Date: 07/12/2018 CLINICAL DATA:  63 year old male status epilepticus and large right MCA infarct. Intubated. EXAM: PORTABLE CHEST 1 VIEW COMPARISON:  07/11/2018 and earlier. FINDINGS: Portable AP semi upright view at 0524 hours. ETT tip in good position at the level the clavicles. Enteric tube courses to the abdomen, tip not included. Right side PICC line has been placed. Tip is at the level of the lower SVC. Normal cardiac size and mediastinal contours. Increasing retrocardiac opacity and obscuration of the left hemidiaphragm. Mild veiling opacity at both lung bases. No pneumothorax or pulmonary edema. Paucity bowel gas in the upper abdomen. IMPRESSION: 1. Right PICC line placed, tip at the level of the lower SVC. Otherwise Stable lines and tubes. 2. Increasing left lower lobe collapse or consolidation. Possible small bilateral  pleural effusions. Electronically Signed   By: Odessa Fleming M.D.   On: 07/12/2018 08:23   Dg Chest Port 1 View  Result Date: 07/11/2018 CLINICAL DATA:  63 year old male with status epilepticus and large right MCA infarct. Intubated. EXAM: PORTABLE CHEST 1 VIEW COMPARISON:  07/08/2018 and earlier. FINDINGS: Portable AP semi upright view at 0645 hours. Endotracheal tube tip in good position between the level the clavicles and carina. Enteric tube side hole at the level of the gastric body. Normal cardiac size and mediastinal contours. Mildly larger lung volumes. Confluent retrocardiac opacity, mild veiling opacity also at the left lung base. Elsewhere Allowing for portable technique the lungs are clear. Negative visible bowel gas pattern. IMPRESSION: 1. ET tube tip in good position. Enteric tube side hole at the level of the gastric body. 2. Mildly larger lung volumes but suspect increased left lower lobe collapse or consolidation since 07/08/2018. Electronically Signed   By: Odessa Fleming M.D.   On: 07/11/2018 08:34   Dg Chest Port 1 View  Result Date: 07/08/2018 CLINICAL DATA:  Respiratory failure. EXAM: PORTABLE CHEST 1 VIEW COMPARISON:  07/06/2018 FINDINGS: The patient is rotated to the right. An endotracheal tube terminates 4 cm above the carina. Enteric tube courses into the abdomen with tip not imaged. The cardiomediastinal silhouette is unchanged with normal heart size. The lungs remain hypoinflated with stable to slight worsening  of mild left basilar opacity. There is also minimal opacity in the right lung base. No sizable pleural effusion or pneumothorax is identified. IMPRESSION: Mild bibasilar opacities, mildly increased and likely reflecting atelectasis. Electronically Signed   By: Sebastian Ache M.D.   On: 07/08/2018 07:15   Dg Chest Port 1 View  Result Date: 07/06/2018 CLINICAL DATA:  Respiratory failure.  Endotracheal tube present. EXAM: PORTABLE CHEST 1 VIEW COMPARISON:  07/04/2018 FINDINGS:  Endotracheal tube is 4.1 cm above the carina. Nasogastric tube extends into the abdomen. New densities along the medial left lung base are suggestive for atelectasis. Otherwise, the lungs are clear. Negative for a pneumothorax. Heart size is normal. IMPRESSION: New densities along the medial left lung base. Favor atelectasis in this region. Endotracheal tube is appropriately positioned. Electronically Signed   By: Richarda Overlie M.D.   On: 07/06/2018 08:16   Dg Chest Port 1 View  Result Date: 07/04/2018 CLINICAL DATA:  Acute respiratory failure. EXAM: PORTABLE CHEST 1 VIEW COMPARISON:  Chest x-ray dated Jul 02, 2018. FINDINGS: Unchanged endotracheal and enteric tubes. The heart size and mediastinal contours are within normal limits. Normal pulmonary vascularity. Hazy opacity at the right lung base is unchanged. No pleural effusion or pneumothorax. No acute osseous abnormality. IMPRESSION: 1. Stable hazy infiltrate at the right lung base. Electronically Signed   By: Obie Dredge M.D.   On: 07/04/2018 08:03   Dg Chest Port 1 View  Result Date: 07/03/2018 CLINICAL DATA:  63 y/o  M; ET tube placement. EXAM: PORTABLE CHEST 1 VIEW COMPARISON:  07/25/2018 chest radiograph. FINDINGS: ET tube tip projects 5 cm above the carina. Enteric tube tip extends below the field of view into the abdomen. Stable infiltrate at the right lung base. No pleural effusion or pneumothorax. Bones are unremarkable. IMPRESSION: ET tube tip projects 5 cm above the carina. Enteric tube tip extends below the field of view into the abdomen. Stable right basilar infiltrate. Electronically Signed   By: Mitzi Hansen M.D.   On: 07/03/2018 00:06   Dg Chest Portable 1 View  Result Date: 07/07/2018 CLINICAL DATA:  Intubation, seizure EXAM: PORTABLE CHEST 1 VIEW COMPARISON:  Portable exam 1910 hours compared to 08/07/2012 FINDINGS: Endotracheal tube present with tip projecting 6.0 cm above carina. Nasogastric tube extends into stomach.  Normal heart size, mediastinal contours, and pulmonary vascularity. Mild airspace infiltrate versus aspiration at RIGHT base. Remaining lungs clear. No pleural effusion or pneumothorax. IMPRESSION: Tube positions as above. Airspace infiltrates at RIGHT base question infiltrate versus aspiration. Electronically Signed   By: Ulyses Southward M.D.   On: 07/27/2018 19:55   Vas US Carotid (at [facility] Only)  Result Date: 07/12/2018 Carotid Arterial Duplex Study Indications: CVA. Limitations: Patient positioning, patient immobility, ventilator Performing Technologist: Chanda Busing RVT  Examination Guidelines: A complete evaluation includes B-mode imaging, spectral Doppler, color Doppler, and power Doppler as needed of all accessible portions of each vessel. Bilateral testing is considered an integral part of a complete examination. Limited examinations for reoccurring indications may be performed as noted.  Right Carotid Findings: +----------+--------+-------+--------+--------------------------------+--------+             PSV cm/s EDV     Stenosis Describe                         Comments                       cm/s                                                        +----------+--------+-------+--------+--------------------------------+--------+  CCA Prox   148      23               smooth and heterogenous                    +----------+--------+-------+--------+--------------------------------+--------+  CCA Distal 64       19               smooth, heterogenous and                                                         calcific                                   +----------+--------+-------+--------+--------------------------------+--------+  ICA Prox   93       30               smooth and heterogenous                    +----------+--------+-------+--------+--------------------------------+--------+  ICA Mid    144      45      40-59%   smooth and heterogenous                     +----------+--------+-------+--------+--------------------------------+--------+  ICA Distal 129      39                                                          +----------+--------+-------+--------+--------------------------------+--------+  ECA        427      89                                                          +----------+--------+-------+--------+--------------------------------+--------+ +----------+--------+-------+--------+-------------------+             PSV cm/s EDV cms Describe Arm Pressure (mmHG)  +----------+--------+-------+--------+-------------------+  Subclavian 170                                            +----------+--------+-------+--------+-------------------+ +---------+--------+--+--------+--+---------+  Vertebral PSV cm/s 92 EDV cm/s 19 Antegrade  +---------+--------+--+--------+--+---------+  Left Carotid Findings: +----------+--------+--------+--------+-----------------------+--------+             PSV cm/s EDV cm/s Stenosis Describe                Comments  +----------+--------+--------+--------+-----------------------+--------+  CCA Prox   157      18                smooth and heterogenous           +----------+--------+--------+--------+-----------------------+--------+  CCA Distal 139      30                smooth and heterogenous           +----------+--------+--------+--------+-----------------------+--------+  ICA Prox   62       20                smooth and heterogenous           +----------+--------+--------+--------+-----------------------+--------+  ICA Distal 126      33                                                  +----------+--------+--------+--------+-----------------------+--------+  ECA        117      11                                                  +----------+--------+--------+--------+-----------------------+--------+ +---------+--------+--+--------+-+---------+  Vertebral PSV cm/s 65 EDV cm/s 9 Antegrade  +---------+--------+--+--------+-+---------+   Summary: Right Carotid: Velocities in the right ICA are consistent with a 40-59%                stenosis. Left Carotid: Velocities in the left ICA are consistent with a 1-39% stenosis. Vertebrals: Bilateral vertebral arteries demonstrate antegrade flow. *See table(s) above for measurements and observations.  Electronically signed by Sherald Hess MD on 07/12/2018 at 4:32:15 PM.    Final    [place] Ekg Site Rite  Result Date: 07/10/2018 If Site Rite image not attached, placement could not be confirmed due to current cardiac rhythm.  [place] Ekg Site Rite  Result Date: 07/08/2018 If Site Rite image not attached, placement could not be confirmed due to current cardiac rhythm.   Labs:  CBC: Recent Labs    07/12/18 0421 07/13/18 0449 07/15/18 0406 07/18/18 0430  WBC 20.7* 20.5* 17.5* 11.5*  HGB 11.9* 10.6* 10.8* 10.5*  HCT 37.8* 34.0* 34.3* 33.0*  PLT 400 369 386 330    COAGS: Recent Labs    07/01/2018 1809 07/04/18 0401  INR 1.3* 1.4*  APTT  --  84*    BMP: Recent Labs    07/14/18 1028 07/15/18 0406 07/18/18 0430 07/19/18 0500  NA 150* 147* 139 138  K 3.5 3.8 3.3* 3.8  CL 117* 112* 103 105  CO2 GLUCOSE 178* 111* 113* 93  BUN CALCIUM 8.1* 8.4* 8.0* 8.0*  CREATININE 0.73 0.69 0.60* 0.59*  GFRNONAA >60 >60 >60 >60  GFRAA >60 >60 >60 >60    LIVER FUNCTION TESTS: Recent Labs    07/10/18 0205 07/14/18 1028 07/15/18 0406 07/18/18 0430  BILITOT 0.6 0.3 0.5 0.2*  AST 24 58* 54* 37  ALT 13 39 39 22  ALKPHOS 40 48 54 42  PROT 4.9* 5.4* 5.9* 5.8*  ALBUMIN 1.7* 1.5* 1.7* 1.6*    TUMOR MARKERS: No results for input(s): AFPTM, CEA, CA199, CHROMGRNA in the last 8760 hours.  Assessment and Plan:  Sz disorder ETOH abuse CVA Dysphagia; deconditioning Unresponsive Malnutrition Long  Term care For trach 5/25-- of Brilinta since 5/20 C diff 5/14-- 10 day course tx Scheduled for percutaneous gastric tube probable 5/26 Risks and benefits  discussed with the patient's daughter [name] via phone including, but not limited to the need for a barium enema during the procedure, bleeding, infection, peritonitis, or damage to adjacent structures.  All of her questions were answered,  she is agreeable to proceed. Consent signed and in chart.  Thank you for this interesting consult.  I greatly enjoyed meeting JEFFERSON FULLAM and look forward to participating in their care.  A copy of this report was sent to the requesting provider on this date.  Electronically Signed: Robet Leu, PA-C 07/20/2018, 2:16 PM   I spent a total of 40 Minutes    in face to face in clinical consultation, greater than 50% of which was counseling/coordinating care for percutaneous gastric tube placement

## 2018-07-20 NOTE — Progress Notes (Addendum)
Neurology Progress Note   S:// Patient seen and examined. Slightly more awake and easier to arouse than before. Still not following commands  O:// Current vital signs: BP (!) 158/79   Pulse 96   Temp 99.8 F (37.7 C) (Axillary)   Resp 14   Ht 5' 11" (1.803 m)   Wt 80.1 kg   SpO2 98%   BMI 24.63 kg/m  Vital signs in last 24 hours: Temp:  [97.6 F (36.4 C)-99.8 F (37.7 C)] 99.8 F (37.7 C) (05/22 0800) Pulse Rate:  [89-109] 96 (05/22 0832) Resp:  [14-23] 14 (05/22 0832) BP: (98-176)/(68-106) 158/79 (05/22 0832) SpO2:  [94 %-100 %] 98 % (05/22 0832) FiO2 (%):  [40 %] 40 % (05/22 0800) Weight:  [80.1 kg] 80.1 kg (05/22 0500) General: Intubated, no sedation HEENT: Normocephalic atraumatic Cardiovascular: Regular rate rhythm, mildly hypertensive Respiratory: Vented, normal saturations Abdominal: Nondistended nontender Neurological exam Intubated, not sedated He opens eyes to voice today. His gaze is midline. Probably blink to threat from both sides today. Does not follow any commands Pupils are equal round react light Positive corneals oculocephalics cough and gag He is breathing over the ventilator and weaning at this time To mild noxious stimulation to the trapezius, he attempts to withdraw right upper extremity but not much on the left upper. He briskly withdraws both lower extremities to mild noxious stimulation Cannot assess gait or coordination.  Medications  Current Facility-Administered Medications:  .  0.9 %  sodium chloride infusion, , Intravenous, PRN, Amie Portland, MD, Stopped at 07/19/18 [phone] .  acetaminophen (TYLENOL) solution 650 mg, 650 mg, Per Tube, Q6H PRN, Rigoberto Noel, MD, 650 mg at 07/16/18 0412 .  albuterol (PROVENTIL) (2.5 MG/3ML) 0.083% nebulizer solution 2.5 mg, 2.5 mg, Nebulization, Q4H PRN, Jennelle Human B, NP .  atorvastatin (LIPITOR) tablet 80 mg, 80 mg, Per Tube, q1800, Collier Bullock, MD, 80 mg at 07/19/18 1822 .  bisacodyl  (DULCOLAX) suppository 10 mg, 10 mg, Rectal, Daily PRN, Jennelle Human B, NP .  cefTRIAXone (ROCEPHIN) 2 g in sodium chloride 0.9 % 100 mL IVPB, 2 g, Intravenous, Q24H, Erick Colace, NP, Stopped at 07/19/18 1319 .  chlorhexidine gluconate (MEDLINE KIT) (PERIDEX) 0.12 % solution 15 mL, 15 mL, Mouth Rinse, BID, Amie Portland, MD, 15 mL at 07/20/18 0757 .  Chlorhexidine Gluconate Cloth 2 % PADS 6 each, 6 each, Topical, Daily, Rigoberto Noel, MD, 6 each at 07/19/18 2116 .  docusate (COLACE) 50 MG/5ML liquid 100 mg, 100 mg, Per Tube, BID PRN, Jennelle Human B, NP .  enoxaparin (LOVENOX) injection 40 mg, 40 mg, Subcutaneous, Q24H, Sood, Vineet, MD, 40 mg at 07/19/18 1200 .  etomidate (AMIDATE) injection 40 mg, 40 mg, Intravenous, Once, Kara Mead V, MD .  feeding supplement (OSMOLITE 1.5 CAL) liquid 1,000 mL, 1,000 mL, Per Tube, Continuous, Rigoberto Noel, MD, Last Rate: 45 mL/hr at 07/20/18 0800 .  feeding supplement (PRO-STAT SUGAR FREE 64) liquid 30 mL, 30 mL, Oral, BID, Rigoberto Noel, MD, 30 mL at 07/19/18 2116 .  fentaNYL (SUBLIMAZE) injection 200 mcg, 200 mcg, Intravenous, Once, Kara Mead V, MD .  fentaNYL (SUBLIMAZE) injection 50-100 mcg, 50-100 mcg, Intravenous, Q30 min PRN, Rigoberto Noel, MD, 50 mcg at 07/08/18 0452 .  folic acid (FOLVITE) tablet 1 mg, 1 mg, Per Tube, Daily, Sood, Vineet, MD, 1 mg at 07/19/18 0936 .  free water 200 mL, 200 mL, Per Tube, Q8H, Erick Colace, NP, 200 mL at 07/20/18 0543 .  insulin aspart (novoLOG) injection 0-9 Units, 0-9 Units, Subcutaneous, Q4H, Simpson, Paula B, NP, 1 Units at 07/18/18 1226 .  lacosamide (VIMPAT) 200 mg in sodium chloride 0.9 % 25 mL IVPB, 200 mg, Intravenous, Q12H, Kerney Elbe, MD, Stopped at 07/20/18 [phone] .  levETIRAcetam (KEPPRA) IVPB 1500 mg/ 100 mL premix, 1,500 mg, Intravenous, Q12H, Greta Doom, MD, Stopped at 07/20/18 [phone] .  LORazepam (ATIVAN) injection 1-2 mg, 1-2 mg, Intravenous, Q1H PRN, Rigoberto Noel, MD .   MEDLINE mouth rinse, 15 mL, Mouth Rinse, 10 times per day, Amie Portland, MD, 15 mL at 07/20/18 0534 .  midazolam (VERSED) injection 5 mg, 5 mg, Intravenous, Once, Rigoberto Noel, MD .  multivitamin with minerals tablet 1 tablet, 1 tablet, Per Tube, Daily, Chesley Mires, MD, 1 tablet at 07/19/18 0936 .  Oxcarbazepine (TRILEPTAL) tablet 600 mg, 600 mg, Per Tube, BID, Greta Doom, MD, 600 mg at 07/19/18 2115 .  pantoprazole sodium (PROTONIX) 40 mg/20 mL oral suspension 40 mg, 40 mg, Per Tube, Q1200, Rigoberto Noel, MD, 40 mg at 07/19/18 1250 .  propofol (DIPRIVAN) 10 mg/mL bolus/IV push 500 mg, 500 mg, Intravenous, Once, Kara Mead V, MD .  sodium chloride flush (NS) 0.9 % injection 10-40 mL, 10-40 mL, Intracatheter, Q12H, Chesley Mires, MD, 20 mL at 07/19/18 2126 .  sodium chloride flush (NS) 0.9 % injection 10-40 mL, 10-40 mL, Intracatheter, PRN, Chesley Mires, MD .  thiamine (VITAMIN B-1) tablet 100 mg, 100 mg, Per Tube, Daily, Chesley Mires, MD, 100 mg at 07/19/18 0936 .  valproate (DEPACON) 500 mg in dextrose 5 % 50 mL IVPB, 500 mg, Intravenous, Q4H, Amie Portland, MD .  vancomycin (VANCOCIN) 50 mg/mL oral solution 125 mg, 125 mg, Oral, QID, Erick Colace, NP, 125 mg at 07/19/18 2115 .  vecuronium (NORCURON) injection 10 mg, 10 mg, Intravenous, Once, Rigoberto Noel, MD  Labs CBC    Component Value Date/Time   WBC 11.5 (H) 07/18/2018 0430   RBC 3.55 (L) 07/18/2018 0430   HGB 10.5 (L) 07/18/2018 0430   HCT 33.0 (L) 07/18/2018 0430   PLT 330 07/18/2018 0430   MCV 93.0 07/18/2018 0430   MCH 29.6 07/18/2018 0430   MCHC 31.8 07/18/2018 0430   RDW 14.6 07/18/2018 0430   LYMPHSABS 1.0 07/08/2018 0329   MONOABS 2.9 (H) 07/08/2018 0329   EOSABS 0.1 07/08/2018 0329   BASOSABS 0.0 07/08/2018 0329    CMP     Component Value Date/Time   NA 138 07/19/2018 0500   K 3.8 07/19/2018 0500   CL 105 07/19/2018 0500   CO2 23 07/19/2018 0500   GLUCOSE 93 07/19/2018 0500   BUN 11  07/19/2018 0500   CREATININE 0.59 (L) 07/19/2018 0500   CALCIUM 8.0 (L) 07/19/2018 0500   PROT 5.8 (L) 07/18/2018 0430   ALBUMIN 1.6 (L) 07/18/2018 0430   AST 37 07/18/2018 0430   ALT 22 07/18/2018 0430   ALKPHOS 42 07/18/2018 0430   BILITOT 0.2 (L) 07/18/2018 0430   GFRNONAA >60 07/19/2018 0500   GFRAA >60 07/19/2018 0500   There is no new imaging to review  Assessment: 63 year old, history of seizures not on antiepileptics initially admitted for status epilepticus and treated with antiepileptics and sedation.  Imaging performed after controlling the status revealed a large right MCA infarct and scattered cortical left MCA territory infarcts, likely the offenders for the seizures and status. Continues to be obtunded.  Also has C. difficile colitis right now for  which she has been treated. Today he seems more awake than yesterday but still not following commands. Family has decided that they should go ahead with tracheostomy and PEG tube placement.  Impression: Seizures-status epilepticus-resolved Large right MCA stroke, small punctate left MCA territory stroke. Right ICA occlusion Toxic metabolic encephalopathy  Recommendations: From a seizure and encephalopathy perspective -Continue Trileptal 600 twice daily Continue Keppra 1.5 g twice daily Continue Vimpat 200 twice daily Change Depakote to 750 - 4 times a day and check an ammonia level and daily Depakote levels for a few days.  A level of around 40 should be therapeutic for him given severe hypoalbuminemia. Management of C. difficile per primary team as you are  From a stroke prevention standpoint Was on Brilinta, on hold for possible tracheostomy.  Resume after tracheostomy May need 30-day cardiac monitoring outpatient because of no obvious atrial fibrillation on telemetry and an acute right ICA occlusion.  -- Amie Portland, MD [facility] Pager: [phone] If 7pm to 7am, please call on call as listed on  [facility]ION.

## 2018-07-20 NOTE — Progress Notes (Signed)
Physical Therapy Treatment Patient Details Name: Edward Cooper MRN: [id number] DOB: Sep 27, 1955 Today's Date: 07/20/2018    History of Present Illness Pt is a 63 y.o. M with significant PMH of CAD s/p PCI, PVD who presents with witnessed seizures x 3, requiring intubation for airway protection on 5/4. Imaging on 5/12 showing extensive right hemisphere infarct, right MCA territory both acute and subacute with areas of reperfusion hemorrhage affecting cortex and basal ganglia. Some extra axial hemorrhage on the right.     PT Comments    Session focused on PROM to bilateral upper and lower extremities with bed in chair position to promote upright in addition to cervical stretching. Pt opens eyes to name, but otherwise lethargic and continues to not follow commands. Noted flexor spasticity (L>R), with positive left foot clonus. Will continue to follow acutely.    Follow Up Recommendations  LTACH     Equipment Recommendations  Other (comment)(defer)    Recommendations for Other Services       Precautions / Restrictions Precautions Precautions: Other (comment) Precaution Comments: rectal tube, ETT ` Restrictions Weight Bearing Restrictions: No    Mobility  Bed Mobility               General bed mobility comments: Pt requires total A for all aspects of bed mobility and is unable to assist   Transfers                 General transfer comment: Unable   Ambulation/Gait                 Stairs             Wheelchair Mobility    Modified Rankin (Stroke Patients Only) Modified Rankin (Stroke Patients Only) Pre-Morbid Rankin Score: No symptoms Modified Rankin: Severe disability     Balance                                            Cognition Arousal/Alertness: Lethargic Behavior During Therapy: (minimally responsive ) Overall Cognitive Status: Impaired/Different from baseline                                  General Comments: Pt minimally responsive, opens eyes briefly to calling name or pain stimuli.  He does not follow commands.        Exercises General Exercises - Upper Extremity Shoulder Flexion: PROM;Both;5 reps Elbow Flexion: PROM;Both;5 reps Elbow Extension: PROM;Both;5 reps Digit Composite Flexion: PROM;Right;Left;10 reps;Supine Composite Extension: PROM;Right;Left;10 reps General Exercises - Lower Extremity Ankle Circles/Pumps: PROM;Both;10 reps Heel Slides: PROM;Both;10 reps Other Exercises Other Exercises: Supine PROM hip internal/external rotation    General Comments        Pertinent Vitals/Pain Pain Assessment: Faces Faces Pain Scale: Hurts a little bit Pain Location: withdraw and grimace to pain stimuli    Home Living                      Prior Function            PT Goals (current goals can now be found in the care plan section) Acute Rehab PT Goals Patient Stated Goal: unable Potential to Achieve Goals: Fair    Frequency    Min 2X/week      PT Plan Current plan remains appropriate  Co-evaluation              AM-PAC PT "6 Clicks" Mobility   Outcome Measure  Help needed turning from your back to your side while in a flat bed without using bedrails?: Total Help needed moving from lying on your back to sitting on the side of a flat bed without using bedrails?: Total Help needed moving to and from a bed to a chair (including a wheelchair)?: Total Help needed standing up from a chair using your arms (e.g., wheelchair or bedside chair)?: Total Help needed to walk in hospital room?: Total Help needed climbing 3-5 steps with a railing? : Total 6 Click Score: 6    End of Session   Activity Tolerance: Patient limited by lethargy Patient left: in bed;with call bell/phone within reach   PT Visit Diagnosis: Other abnormalities of gait and mobility (R26.89);Other symptoms and signs involving the nervous system (R60.454(R29.898)     Time:  [id number] PT Time Calculation (min) (ACUTE ONLY): 17 min  Charges:  $Therapeutic Exercise: 8-22 mins                     Laurina Bustlearoline Ajax Schroll, PT, DPT Acute Rehabilitation Services Pager [phone] Office [phone]    Vanetta MuldersCarloine H Coran Dipaola 07/20/2018, 5:24 PM

## 2018-07-21 LAB — GLUCOSE, CAPILLARY
Glucose-Capillary: 100 mg/dL — ABNORMAL HIGH (ref 70–99)
Glucose-Capillary: 119 mg/dL — ABNORMAL HIGH (ref 70–99)
Glucose-Capillary: 123 mg/dL — ABNORMAL HIGH (ref 70–99)
Glucose-Capillary: 85 mg/dL (ref 70–99)
Glucose-Capillary: 87 mg/dL (ref 70–99)
Glucose-Capillary: 99 mg/dL (ref 70–99)

## 2018-07-21 LAB — VALPROIC ACID LEVEL: Valproic Acid Lvl: 41 ug/mL — ABNORMAL LOW (ref 50.0–100.0)

## 2018-07-21 NOTE — Progress Notes (Signed)
Progress note  Patient seen and examined No acute changes Exam unchanged from yesterday  Recommendations Continue antiepileptics Continue correction of toxic metabolic derangements per primary team as you are  I will follow again tomorrow    -- Milon Dikes, MD [facility] Pager: [phone] If 7pm to 7am, please call on call as listed on AMION.

## 2018-07-21 NOTE — Progress Notes (Signed)
NAME:  Edward NashJeffrey D Cooper, MRN:  [id number], DOB:  1955-03-30, LOS: 19 ADMISSION DATE:  07/23/2018, CONSULTATION DATE:  07/20/2018 REFERRING MD:  [facility], CHIEF COMPLAINT:  seizures  Brief History   [age] yoM presenting with witnessed seizures x 3, did not return to baseline and required intubation for airway protection.  [facility]    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  5/12 weaning versed and up-titrating propofol in hopes of burst suppression.  5/16: No further seizures.  Remains comatose however tolerating pressure support ventilation, starting antibiotics (cefepime and vancomycin) for temperature and left lower lobe pneumonia 5/17 looks about the same  5/18 eyes open, does not track, antibiotics narrowed to ceftriaxone 5/19: New twitching of left leg, EEG ordered 5/20: EEG negative for seizure.  White blood cell count improving.  No fever spikes. 5/21 Brilinta stopped; Trach planned for 5/25 now  Consults:  Neurology   Procedures:  5/4 ETT >> 5/13 Rt PICC >>   Significant Diagnostic Tests:  5/4 [facility] >> chronic changes, no acute abnormality 5/5 EEG >>abnormal, intermittent left hemispheric sharp waves.   LTM EEG 5/5 >> multiple seizures arising from left paracentral frontocentral and central parietal cortex. 5/7 left PLEDs 5/10 EEG Left frontocentral central parietal poorly formed sharply still present suggestive of some degree of cortical irritability in that region.   5/11 EEG  Occasionally left frontocentral sharp waves and spikes present suggestive of some  degree of cortical irritability. No clinical or subclinical seizures present throughout the recording.  5/12 MRI >> Extensive RIGHT hemisphere infarct, RIGHT MCA territory, both acute and subacute, with areas of reperfusion hemorrhage affecting not only cortex but the basal ganglia.Some extra-axial hemorrhage on the RIGHT  Micro Data:  5/4 MRSA PCR  >>neg 5/5 respiratory >> neg 5/14 C diff Ag POS, PCR positive 5/16:Sputum: H influenza as well as E. coli both sensitive to ceftriaxone  Antimicrobials:  5/5 unasyn > 5/11 5/14 vancomycin oral >> 5/16: Vancomycin>>> 5/18 5/16: Cefepime>>> 5/18 Ceftriaxone 5/18 Interim history/subjective:  No fever spikes no issues overnight  Objective   Blood pressure (!) 151/91, pulse 94, temperature 99.8 F (37.7 C), temperature source Axillary, resp. rate 16, height 5\' 11"  (1.803 m), weight 75 kg, SpO2 100 %.    Vent Mode: PRVC FiO2 (%):  [40 %] 40 % Set Rate:  [16 bmp] 16 bmp Vt Set:  [600 mL] 600 mL PEEP:  [5 cmH20] 5 cmH20 Pressure Support:  [12 cmH20] 12 cmH20 Plateau Pressure:  [14 cmH20-20 cmH20] 17 cmH20   Intake/Output Summary (Last 24 hours) at 07/21/2018 1107 Last data filed at 07/21/2018 0800 Gross per 24 hour  Intake 2152.37 ml  Output 2975 ml  Net -822.63 ml   Filed Weights   07/18/18 0500 07/20/18 0500 07/21/18 0500  Weight: 79.9 kg 80.1 kg 75 kg    Examination:   General 63 year old male patient he is lying in bed. Left eye opens to verbal request today he is not focusing nor following commands HEENT [place]/AT;  no JVD; sclerae anicteric; orally intubated Pulmonary: CTAB; diminished in  the bases, no accessory use on mechanically assisted breath Cardiac: RRR no r/m/g Abdomen: Soft, not tender.  Continues to have liquid stool he has a Flexi-Seal in place Neuro: Eyes open to verbal stimulation.  Some weak withdrawal on the right only. LLE ankle clonus. Hemiparetic on the left. GU: Clear yellow.  Resolved Hospital Problem list    AKI  Aspiration PNA w/ RLL infiltrate (completed 7 d rx) Hypernatremia Status epilepticus Assessment & Plan:   large right MCA infarct/reperfusion hemorrhage, complicated by acute metabolic encephalopathy?  Alcohol withdrawal contributing, versus acute infection It is felt the acute stroke likely was the cause of status Neurology notes reviewed. -It appears as though he will be significantly debilitated as a consequence of his stroke Plan Continuing Trileptal, Depakote, Keppra and Vimpat this has been directed by neurology interventional radiology consult for PEG Disposition will be skilled nursing facility following liberation from ventilator   Acute respiratory failure w/ ventilator dependence d/t on-going encephalopathy, complicated by ventilator associated pneumonia (E-coli and H influenza both (S) to CTX) Portable chest x-ray 5/21:This demonstrates the PICC line and endotracheal tube are both in satisfactory position.  Aeration is improved with diuresis ordered yesterday.  Continues to have some basilar volume loss however effusions have improved I/O since admission -5L Plan Continuing pressure support ventilation as tolerated. Plan trach 5/25. Day #6 of 7 antibiotics currently on ceftriaxone, PO Vanc for c.diff   Hypertension and hyperlipidemia Plan Monitoring blood pressure, continuing statin therapy  Coronary artery disease status post PCI, left ventricular thrombus on Coumadin, PVD Ejection fraction 35 to 40% in 2014 Plan Brilinta on hold for trach; resume after procedure. Continue telemetry monitoring  Fever, leukocytosis: Improving Plan Repeat CBC in a.m.  C. difficile colitis Plan The plan will be to continue oral vancomycin for 10-day course after completing ceftriaxone   Fluid and electrolyte imbalance: He remains volume overloaded, potassium and water balance have improved Plan Continue free water replacement IV Lasix again for pleural effusions We  will go ahead and replace potassium given Lasix administration  Hyperglycemia Plan Sliding scale insulin currently has excellent control Best practice:  Diet: Tube feeds Pain/Anxiety/Delirium protocol (if indicated): Sedation as needed VAP protocol (if indicated): yes DVT prophylaxis: SCDs GI prophylaxis: EPI Glucose control: SSI, sensitive scale Mobility: BR Code Status: Full  Family Communication: update as able.  Disposition:  Have placed social work consult for skilled nursing facility    I have independently seen and examined the patient, reviewed data, and developed an assessment and plan. A total of 31 minutes were spent in critical care assessment and medical decision making. This critical care time does not reflect procedure time, or teaching time or supervisory time of PA/NP/Med student/Med Resident, etc but could involve care discussion time.  Gwynne Edinger, MD PhD 07/21/18 11:07 AM

## 2018-07-22 DIAGNOSIS — J9611 Chronic respiratory failure with hypoxia: Secondary | ICD-10-CM

## 2018-07-22 DIAGNOSIS — J9612 Chronic respiratory failure with hypercapnia: Secondary | ICD-10-CM

## 2018-07-22 LAB — GLUCOSE, CAPILLARY
Glucose-Capillary: 108 mg/dL — ABNORMAL HIGH (ref 70–99)
Glucose-Capillary: 123 mg/dL — ABNORMAL HIGH (ref 70–99)
Glucose-Capillary: 113 mg/dL — ABNORMAL HIGH (ref 70–99)
Glucose-Capillary: 108 mg/dL — ABNORMAL HIGH (ref 70–99)
Glucose-Capillary: 107 mg/dL — ABNORMAL HIGH (ref 70–99)
Glucose-Capillary: 112 mg/dL — ABNORMAL HIGH (ref 70–99)

## 2018-07-22 LAB — VALPROIC ACID LEVEL: Valproic Acid Lvl: 41 ug/mL — ABNORMAL LOW (ref 50.0–100.0)

## 2018-07-22 NOTE — Progress Notes (Signed)
NAME:  Edward Cooper, MRN:  [id number], DOB:  August 03, 1955, LOS: 20 ADMISSION DATE:  07/18/18, CONSULTATION DATE:  2018/07/18 REFERRING MD:  [facility], CHIEF COMPLAINT:  seizures  Brief History   82 yoM presenting with witnessed seizures x 3, did not return to baseline and required intubation for airway protection.  Transferred from [facility] to [facility] for continuous EEG.    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  5/12 weaning versed and up-titrating propofol in hopes of burst suppression.  5/16: No further seizures.  Remains comatose however tolerating pressure support ventilation, starting antibiotics (cefepime and vancomycin) for temperature and left lower lobe pneumonia 5/17 looks about the same  5/18 eyes open, does not track, antibiotics narrowed to ceftriaxone 5/19: New twitching of left leg, EEG ordered 5/20: EEG negative for seizure.  White blood cell count improving.  No fever spikes. 5/21 Brilinta stopped; Trach planned for 5/25 now  Consults:  Neurology   Procedures:  5/4 ETT >> 5/13 Rt PICC >>   Significant Diagnostic Tests:  5/4 [facility] >> chronic changes, no acute abnormality 5/5 EEG >>abnormal, intermittent left hemispheric sharp waves.   LTM EEG 5/5 >> multiple seizures arising from left paracentral frontocentral and central parietal cortex. 5/7 left PLEDs 5/10 EEG Left frontocentral central parietal poorly formed sharply still present suggestive of some degree of cortical irritability in that region.   5/11 EEG  Occasionally left frontocentral sharp waves and spikes present suggestive of some  degree of cortical irritability. No clinical or subclinical seizures present throughout the recording.  5/12 MRI >> Extensive RIGHT hemisphere infarct, RIGHT MCA territory, both acute and subacute, with areas of reperfusion hemorrhage affecting not only cortex but the basal ganglia.Some extra-axial hemorrhage on the RIGHT  Micro Data:  5/4 MRSA PCR  >>neg 5/5 respiratory >> neg 5/14 C diff Ag POS, PCR positive 5/16:Sputum: H influenza as well as E. coli both sensitive to ceftriaxone  Antimicrobials:  5/5 unasyn > 5/11 5/14 vancomycin oral >> 5/16: Vancomycin>>> 5/18 5/16: Cefepime>>> 5/18 Ceftriaxone 5/18 Interim history/subjective:  No fever spikes; no issues or acute events overnight  Objective   Blood pressure (!) 130/91, pulse (!) 106, temperature 99.3 F (37.4 C), temperature source Axillary, resp. rate 15, height 5\' 11"  (1.803 m), weight 74.4 kg, SpO2 100 %.    Vent Mode: PSV;CPAP FiO2 (%):  [40 %] 40 % Set Rate:  [16 bmp] 16 bmp Vt Set:  [600 mL] 600 mL PEEP:  [5 cmH20] 5 cmH20 Pressure Support:  [10 cmH20] 10 cmH20 Plateau Pressure:  [16 cmH20-17 cmH20] 17 cmH20   Intake/Output Summary (Last 24 hours) at 07/22/2018 0905 Last data filed at 07/22/2018 0600 Gross per 24 hour  Intake 2064.02 ml  Output 2125 ml  Net -60.98 ml   Filed Weights   07/20/18 0500 07/21/18 0500 07/22/18 0500  Weight: 80.1 kg 75 kg 74.4 kg    Examination:   General: NAD,  HEENT [place]/AT;  no JVD; sclerae anicteric;  Pulmonary: CTAB; diminished in the bases, no accessory use on mechanically assisted breath Cardiac: RRR no r/m/g Abdomen: Soft, not tender.  Continues to have liquid stool he has a Flexi-Seal in place Neuro: Eyes open to verbal stimulation.  Some weak withdrawal on the right only. LLE ankle clonus. Hemiparetic on the left. GU: Clear yellow.  Resolved Hospital Problem list   AKI  Aspiration PNA w/ RLL infiltrate (completed 7 d rx) Hypernatremia Status epilepticus Assessment  & Plan:   large right MCA infarct/reperfusion hemorrhage, complicated by acute metabolic encephalopathy?  Alcohol withdrawal contributing, versus acute infection It is felt the acute stroke likely was the cause of status Neurology notes reviewed. -It appears as though he will be significantly debilitated as a consequence of his stroke Plan Continuing Trileptal, Depakote, Keppra and Vimpat this has been directed by neurology interventional radiology consult for PEG; ? Need to finish vanc 1st Disposition will be skilled nursing facility following liberation from ventilator   Acute respiratory failure w/ ventilator dependence d/t on-going encephalopathy, complicated by ventilator associated pneumonia (E-coli and H influenza both (S) to CTX) Portable chest x-ray 5/21:This demonstrates the PICC line and endotracheal tube are both in satisfactory position.  Aeration is improved with diuresis ordered yesterday.  Continues to have some basilar volume loss however effusions have improved I/O since admission -5L Plan Continuing pressure support ventilation as tolerated. Plan trach 5/25. Day #6 of 7 antibiotics currently on ceftriaxone, PO Vanc for c.diff   Hypertension and hyperlipidemia Plan Monitoring blood pressure, continuing statin therapy  Coronary artery disease status post PCI, left ventricular thrombus on Coumadin, PVD Ejection fraction 35 to 40% in 2014 Plan Brilinta on hold for trach; resume after procedure. Continue telemetry monitoring  Fever, leukocytosis: Improving Plan Repeat CBC in a.m.  C. difficile colitis Plan The plan will be to continue oral vancomycin for 10-day course after completing ceftriaxone   Fluid and electrolyte imbalance: He remains volume overloaded, potassium and water balance have improved Plan Continue free water replacement IV Lasix again for pleural effusions We will go ahead and replace potassium given Lasix administration  Hyperglycemia  Plan Sliding scale insulin currently has excellent control Best practice:  Diet: Tube feeds Pain/Anxiety/Delirium protocol (if indicated): Sedation as needed VAP protocol (if indicated): yes DVT prophylaxis: SCDs GI prophylaxis: EPI Glucose control: SSI, sensitive scale Mobility: BR Code Status: Full  Family Communication: update as able.  Disposition:  Have placed social work consult for skilled nursing facility    I have independently seen and examined the patient, reviewed data, and developed an assessment and plan. A total of 31 minutes were spent in critical care assessment and medical decision making. This critical care time does not reflect procedure time, or teaching time or supervisory time of PA/NP/Med student/Med Resident, etc but could involve care discussion time.  Gwynne EdingerPaul C. Caliope Ruppert, MD PhD 07/22/18 9:05 AM

## 2018-07-22 NOTE — Progress Notes (Signed)
Neurology Progress Note   S:// Patient seen and examined. No acute overnight events According to RT, seems more awake today with patient care.  O:// Current vital signs: BP 138/85   Pulse 98   Temp 99.3 F (37.4 C) (Axillary)   Resp 16   Ht 5' 11"  (1.803 m)   Wt 74.4 kg   SpO2 100%   BMI 22.88 kg/m  Vital signs in last 24 hours: Temp:  [98.6 F (37 C)-99.7 F (37.6 C)] 99.3 F (37.4 C) (05/24 0354) Pulse Rate:  [93-110] 98 (05/24 0614) Resp:  [14-16] 16 (05/24 0614) BP: (91-186)/(66-94) 138/85 (05/24 0614) SpO2:  [96 %-100 %] 100 % (05/24 0614) FiO2 (%):  [40 %] 40 % (05/24 0400) Weight:  [74.4 kg] 74.4 kg (05/24 0500) General: Intubated, no sedation HEENT: Normocephalic, atraumatic, ET tube in place Cardiovascular: Regular rate rhythm, mostly normotensive Respiratory: Vented, normal saturations Neurological exam He is intubated, not sedated He opens eyes spontaneously as well as to the voice. He does not follow any commands. His pupils are equal round reactive to light He does seem to blink to threat more from the right. Positive corneals Positive oculocephalics Positive cough and gag He is breathing over the ventilator and is on some today, better than prior days per respiratory therapy. To mild noxious stimulation he attempts to withdraw right upper extremity but not much on the left upper extremity.  He briskly withdraws both lower extremities to my noxious stimulation. Cannot assess gait or coordination Medications  Current Facility-Administered Medications:  .  0.9 %  sodium chloride infusion, , Intravenous, PRN, Amie Portland, MD, Stopped at 07/19/18 [phone] .  acetaminophen (TYLENOL) solution 650 mg, 650 mg, Per Tube, Q6H PRN, Rigoberto Noel, MD, 650 mg at 07/21/18 1516 .  albuterol (PROVENTIL) (2.5 MG/3ML) 0.083% nebulizer solution 2.5 mg, 2.5 mg, Nebulization, Q4H PRN, Jennelle Human B, NP .  atorvastatin (LIPITOR) tablet 80 mg, 80 mg, Per Tube, q1800,  Collier Bullock, MD, 80 mg at 07/21/18 1835 .  bisacodyl (DULCOLAX) suppository 10 mg, 10 mg, Rectal, Daily PRN, Jennelle Human B, NP .  chlorhexidine gluconate (MEDLINE KIT) (PERIDEX) 0.12 % solution 15 mL, 15 mL, Mouth Rinse, BID, Amie Portland, MD, 15 mL at 07/21/18 2034 .  Chlorhexidine Gluconate Cloth 2 % PADS 6 each, 6 each, Topical, Daily, Rigoberto Noel, MD, 6 each at 07/22/18 0200 .  docusate (COLACE) 50 MG/5ML liquid 100 mg, 100 mg, Per Tube, BID PRN, Jennelle Human B, NP .  enoxaparin (LOVENOX) injection 40 mg, 40 mg, Subcutaneous, Q24H, Monia Sabal, PA-C, 40 mg at 07/21/18 1100 .  etomidate (AMIDATE) injection 40 mg, 40 mg, Intravenous, Once, Kara Mead V, MD .  feeding supplement (OSMOLITE 1.5 CAL) liquid 1,000 mL, 1,000 mL, Per Tube, Continuous, Rigoberto Noel, MD, Last Rate: 45 mL/hr at 07/22/18 0600 .  feeding supplement (PRO-STAT SUGAR FREE 64) liquid 30 mL, 30 mL, Oral, BID, Rigoberto Noel, MD, 30 mL at 07/21/18 2249 .  fentaNYL (SUBLIMAZE) injection 200 mcg, 200 mcg, Intravenous, Once, Kara Mead V, MD .  fentaNYL (SUBLIMAZE) injection 50-100 mcg, 50-100 mcg, Intravenous, Q30 min PRN, Rigoberto Noel, MD, 100 mcg at 07/22/18 0605 .  folic acid (FOLVITE) tablet 1 mg, 1 mg, Per Tube, Daily, Chesley Mires, MD, 1 mg at 07/21/18 0923 .  free water 200 mL, 200 mL, Per Tube, Q8H, Erick Colace, NP, 200 mL at 07/22/18 0553 .  insulin aspart (novoLOG) injection 0-9 Units, 0-9 Units,  Subcutaneous, Q4H, Jennelle Human B, NP, 1 Units at 07/21/18 1627 .  lacosamide (VIMPAT) 200 mg in sodium chloride 0.9 % 25 mL IVPB, 200 mg, Intravenous, Q12H, Kerney Elbe, MD, Last Rate: 90 mL/hr at 07/22/18 0633, 200 mg at 07/22/18 3016 .  levETIRAcetam (KEPPRA) IVPB 1500 mg/ 100 mL premix, 1,500 mg, Intravenous, Q12H, Greta Doom, MD, Last Rate: 400 mL/hr at 07/22/18 0600 .  LORazepam (ATIVAN) injection 1-2 mg, 1-2 mg, Intravenous, Q1H PRN, Rigoberto Noel, MD .  MEDLINE mouth rinse, 15  mL, Mouth Rinse, 10 times per day, Amie Portland, MD, 15 mL at 07/22/18 0553 .  midazolam (VERSED) injection 5 mg, 5 mg, Intravenous, Once, Rigoberto Noel, MD .  multivitamin with minerals tablet 1 tablet, 1 tablet, Per Tube, Daily, Chesley Mires, MD, 1 tablet at 07/21/18 [phone] .  Oxcarbazepine (TRILEPTAL) tablet 600 mg, 600 mg, Per Tube, BID, Greta Doom, MD, 600 mg at 07/21/18 2249 .  pantoprazole sodium (PROTONIX) 40 mg/20 mL oral suspension 40 mg, 40 mg, Per Tube, Q1200, Rigoberto Noel, MD, 40 mg at 07/21/18 1100 .  propofol (DIPRIVAN) 10 mg/mL bolus/IV push 500 mg, 500 mg, Intravenous, Once, Kara Mead V, MD .  sodium chloride flush (NS) 0.9 % injection 10-40 mL, 10-40 mL, Intracatheter, Q12H, Sood, Vineet, MD, 20 mL at 07/21/18 2249 .  sodium chloride flush (NS) 0.9 % injection 10-40 mL, 10-40 mL, Intracatheter, PRN, Chesley Mires, MD .  thiamine (VITAMIN B-1) tablet 100 mg, 100 mg, Per Tube, Daily, Chesley Mires, MD, 100 mg at 07/21/18 0924 .  valproate (DEPACON) 750 mg in dextrose 5 % 50 mL IVPB, 750 mg, Intravenous, QID, Amie Portland, MD, Stopped at 07/22/18 0000 .  vancomycin (VANCOCIN) 50 mg/mL oral solution 125 mg, 125 mg, Oral, QID, Erick Colace, NP, 125 mg at 07/21/18 2249 .  vecuronium (NORCURON) injection 10 mg, 10 mg, Intravenous, Once, Rigoberto Noel, MD Labs CBC    Component Value Date/Time   WBC 11.5 (H) 07/18/2018 0430   RBC 3.55 (L) 07/18/2018 0430   HGB 10.5 (L) 07/18/2018 0430   HCT 33.0 (L) 07/18/2018 0430   PLT 330 07/18/2018 0430   MCV 93.0 07/18/2018 0430   MCH 29.6 07/18/2018 0430   MCHC 31.8 07/18/2018 0430   RDW 14.6 07/18/2018 0430   LYMPHSABS 1.0 07/08/2018 0329   MONOABS 2.9 (H) 07/08/2018 0329   EOSABS 0.1 07/08/2018 0329   BASOSABS 0.0 07/08/2018 0329    CMP     Component Value Date/Time   NA 138 07/19/2018 0500   K 3.8 07/19/2018 0500   CL 105 07/19/2018 0500   CO2 23 07/19/2018 0500   GLUCOSE 93 07/19/2018 0500   BUN 11  07/19/2018 0500   CREATININE 0.59 (L) 07/19/2018 0500   CALCIUM 8.0 (L) 07/19/2018 0500   PROT 5.8 (L) 07/18/2018 0430   ALBUMIN 1.6 (L) 07/18/2018 0430   AST 37 07/18/2018 0430   ALT 22 07/18/2018 0430   ALKPHOS 42 07/18/2018 0430   BILITOT 0.2 (L) 07/18/2018 0430   GFRNONAA >60 07/19/2018 0500   GFRAA >60 07/19/2018 0500   No new imaging to review.  Prior imaging with large right MCA stroke and scattered embolic left MCA strokes.  Assessment: 62 year old history of seizures not on antiepileptics initially admitted for status epilepticus from an outside hospital and treated with antiepileptics and sedation for control of the status.  Imaging performed after controlling status revealed a large right MCA infarct and scattered  left cortical infarcts that will likely are the etiology for the seizures and the status.  Continues to be obtunded but is over the course of the week being more awake. Also has C. difficile colitis now for which she is being treated. Day over day has been mildly more awake but still not following any commands. Family has decided to go ahead with tracheostomy and PEG tube placement- mildly delayed because of antiplatelets on board and they are being held in preparation for the procedures in the coming week   Impression: Seizures/status epilepticus-resolved Large right MCA stroke, small left MCA cortical stroke Right ICA occlusion Toxic metabolic encephalopathy   Recommendations: Continue Trileptal, Keppra, Vimpat and Depakote at current doses. Goal Depakote level around 40 given hypoalbuminemia. Management of C. difficile per primary team Resume antiplatelet when okay after the procedures Will need long-term cardiac monitoring  Neurology service will be available as needed.  Please call with questions.  -- Amie Portland, MD [facility] Pager: [phone] If 7pm to 7am, please call on call as listed on AMION.

## 2018-07-23 DIAGNOSIS — J9612 Chronic respiratory failure with hypercapnia: Secondary | ICD-10-CM

## 2018-07-23 DIAGNOSIS — I639 Cerebral infarction, unspecified: Secondary | ICD-10-CM

## 2018-07-23 DIAGNOSIS — J9611 Chronic respiratory failure with hypoxia: Secondary | ICD-10-CM

## 2018-07-23 LAB — PROTIME-INR
INR: 1.1 (ref 0.8–1.2)
Prothrombin Time: 14.2 seconds (ref 11.4–15.2)

## 2018-07-23 LAB — GLUCOSE, CAPILLARY
Glucose-Capillary: 101 mg/dL — ABNORMAL HIGH (ref 70–99)
Glucose-Capillary: 93 mg/dL (ref 70–99)
Glucose-Capillary: 93 mg/dL (ref 70–99)
Glucose-Capillary: 98 mg/dL (ref 70–99)
Glucose-Capillary: 98 mg/dL (ref 70–99)

## 2018-07-23 LAB — CBC
HCT: 35.9 % — ABNORMAL LOW (ref 39.0–52.0)
Hemoglobin: 11.4 g/dL — ABNORMAL LOW (ref 13.0–17.0)
MCH: 29.4 pg (ref 26.0–34.0)
MCHC: 31.8 g/dL (ref 30.0–36.0)
MCV: 92.5 fL (ref 80.0–100.0)
Platelets: 437 10*3/uL — ABNORMAL HIGH (ref 150–400)
RBC: 3.88 MIL/uL — ABNORMAL LOW (ref 4.22–5.81)
RDW: 14.6 % (ref 11.5–15.5)
WBC: 16.1 10*3/uL — ABNORMAL HIGH (ref 4.0–10.5)
nRBC: 0 % (ref 0.0–0.2)

## 2018-07-23 LAB — VALPROIC ACID LEVEL: Valproic Acid Lvl: 44 ug/mL — ABNORMAL LOW (ref 50.0–100.0)

## 2018-07-23 MED ORDER — FENTANYL CITRATE (PF) 100 MCG/2ML IJ SOLN: 200.0000 ug | Freq: Once | INTRAMUSCULAR | Status: AC

## 2018-07-23 MED ORDER — ETOMIDATE 2 MG/ML IV SOLN: 40.0000 mg | Freq: Once | INTRAVENOUS | Status: AC

## 2018-07-23 MED ORDER — VECURONIUM BROMIDE 10 MG IV SOLR: 10.0000 mg | Freq: Once | INTRAVENOUS | Status: AC

## 2018-07-23 MED ORDER — MIDAZOLAM HCL 2 MG/2ML IJ SOLN: 5.0000 mg | Freq: Once | INTRAMUSCULAR | Status: AC

## 2018-07-23 MED ORDER — LIDOCAINE HCL (PF) 2 % IJ SOLN
0.0000 mL | Freq: Once | INTRAMUSCULAR | Status: DC | PRN
  Filled 2018-07-23: qty 20

## 2018-07-23 NOTE — Progress Notes (Signed)
NAME:  Edward Cooper, MRN:  [id number], DOB:  [birth year], LOS: 21 ADMISSION DATE:  07-29-2018, CONSULTATION DATE:  29-Jul-2018 REFERRING MD:  [facility], CHIEF COMPLAINT:  seizures  Brief History   3 yoM presenting with witnessed seizures x 3, did not return to baseline and required intubation for airway protection.  Transferred from [facility] to [facility] for continuous EEG.    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  5/12 weaning versed and up-titrating propofol in hopes of burst suppression.  5/16: No further seizures.  Remains comatose however tolerating pressure support ventilation, starting antibiotics (cefepime and vancomycin) for temperature and left lower lobe pneumonia 5/17 looks about the same  5/18 eyes open, does not track, antibiotics narrowed to ceftriaxone 5/19: New twitching of left leg, EEG ordered 5/20: EEG negative for seizure.  White blood cell count improving.  No fever spikes. 5/21 Brilinta stopped; Trach planned for 5/25 now  Consults:  Neurology   Procedures:  5/4 ETT >> 5/13 Rt PICC >>   Significant Diagnostic Tests:  5/4 [facility] >> chronic changes, no acute abnormality 5/5 EEG >>abnormal, intermittent left hemispheric sharp waves.   LTM EEG 5/5 >> multiple seizures arising from left paracentral frontocentral and central parietal cortex. 5/7 left PLEDs 5/10 EEG Left frontocentral central parietal poorly formed sharply still present suggestive of some degree of cortical irritability in that region.   5/11 EEG  Occasionally left frontocentral sharp waves and spikes present suggestive of some  degree of cortical irritability. No clinical or subclinical seizures present throughout the recording.  5/12 MRI >> Extensive RIGHT hemisphere infarct, RIGHT MCA territory, both acute and subacute, with areas of reperfusion hemorrhage affecting not only cortex but the basal ganglia.Some extra-axial hemorrhage on the RIGHT  Micro Data:  5/4 MRSA PCR  >>neg 5/5 respiratory >> neg 5/14 C diff Ag POS, PCR positive 5/16:Sputum: H influenza as well as E. coli both sensitive to ceftriaxone  Antimicrobials:  5/5 unasyn > 5/11 5/14 vancomycin oral >> 5/16: Vancomycin>>> 5/18 5/16: Cefepime>>> 5/18 Ceftriaxone 5/18 >> 5/25 Interim history/subjective:  Afebrile Remains on vent, no new events overnight  Objective   Blood pressure (!) 145/92, pulse (!) 105, temperature 98.8 F (37.1 C), temperature source Axillary, resp. rate 12, height 5\' 11"  (1.803 m), weight 74 kg, SpO2 100 %.    Vent Mode: PRVC FiO2 (%):  [40 %] 40 % Set Rate:  [16 bmp] 16 bmp Vt Set:  [600 mL] 600 mL PEEP:  [5 cmH20] 5 cmH20 Pressure Support:  [10 cmH20] 10 cmH20 Plateau Pressure:  [16 cmH20-17 cmH20] 16 cmH20   Intake/Output Summary (Last 24 hours) at 07/23/2018 0846 Last data filed at 07/23/2018 0700 Gross per 24 hour  Intake 673.48 ml  Output 2850 ml  Net -2176.52 ml   Filed Weights   07/21/18 0500 07/22/18 0500 07/23/18 0500  Weight: 75 kg 74.4 kg 74 kg    Examination:   General: Chronically ill-appearing, no distress HEENT /AT;  no JVD; sclerae anicteric; mild pallor Pulmonary: CTAB; diminished in the bases, no accessory muscle use Cardiac: RRR no r/m/g Abdomen: Soft, not  tender.  Continues to have liquid stool he has a Flexi-Seal in place Neuro: Eyes open to verbal stimulation.  Some weak withdrawal on the right only. LLE ankle clonus. Hemiparetic on the left. GU: Clear yellow.  Resolved Hospital Problem list   AKI  Aspiration PNA w/ RLL infiltrate (completed 7 d rx) Hypernatremia Status  epilepticus Assessment & Plan:   Large right MCA infarct/reperfusion hemorrhage, complicated by acute metabolic encephalopathy?  Alcohol withdrawal contributing, versus acute infection It is felt the acute stroke likely was the cause of status -It appears as though he will be significantly debilitated as a consequence of his stroke Plan Continuing Trileptal, Depakote, Keppra and Vimpat as per  neurology Brilinta on hold for trach and PEG    Acute respiratory failure w/ ventilator dependence d/t on-going encephalopathy, complicated by ventilator associated pneumonia (E-coli and H influenza both (S) to CTX)  Plan Continuing pressure support ventilation as tolerated.  Plan trach 5/25.  Hope to liberate soon after    Hypertension and hyperlipidemia Plan Monitoring blood pressure, continuing statin therapy  Coronary artery disease status post PCI, left ventricular thrombus on Coumadin, PVD Ejection fraction 35 to 40% in 2014 Plan Brilinta on hold for trach/ peg   telemetry   Fever, leukocytosis: Improving Plan Repeat CBC in a.m.  HCAP -E. coli and Haemophilus C. difficile colitis Plan DC ceftriaxone 5/25 continue oral vancomycin for other 10-day course    Hyperglycemia Plan Sliding scale insulin   Best practice:  Diet: Tube feeds Pain/Anxiety/Delirium protocol (if indicated): Sedation as needed VAP protocol (if indicated): yes DVT prophylaxis: SCDs GI prophylaxis: EPI Glucose control: SSI, sensitive scale Mobility: BR Code Status: Full  Family Communication: Daughter Disposition:  Have placed social work consult for skilled nursing facility   Summary-plan is for trach today hopeful to liberate him soon after but will likely need PEG given limited neurological improvement.  He will need vent SNF or LTAC depending on whether he liberate from the vent  The patient is critically ill with multiple organ systems failure and requires high complexity decision making  for assessment and support, frequent evaluation and titration of therapies, application of advanced monitoring technologies and extensive interpretation of multiple databases. Critical Care Time devoted to patient care services described in this note independent of APP/resident  time is 31 minutes.   Cyril Mourningakesh Raj Landress MD. Tonny BollmanFCCP. [place] [facility] Pager [phone] If no response call [phone]    07/23/18 8:46 AM

## 2018-07-23 NOTE — Progress Notes (Signed)
CDS called and made aware of extubation tomorrow. Referral number in chart.

## 2018-07-23 NOTE — Progress Notes (Addendum)
[name], patient's daughter called me and stated the family agrees to make Mr. Gradillas comfort care tomorrow 5/26. They request to proceed with extubation and morphine drip when they arrive.   Dr. Tonia Brooms paged and made aware.

## 2018-07-23 NOTE — Progress Notes (Deleted)
Pt with sudden onset of Afib RVR at 1437 with immediate hypotension. MD paged and verbal order received for 150 mg amiodarone bolus. Bolus given at 1455 and Neo titrated to support BP. Pt converted back to SR at 1520 with several short episodes of Afib rate controlled from 1520 to 1545. Pt now in SR with stable BP 99/52 by aline.

## 2018-07-23 NOTE — Progress Notes (Signed)
PCCM:  I was asked by Dr. Vassie Loll to evaluate Mr. Edward Cooper for bedside percutaneous tracheostomy.  I called and spoke with the patient's medical power of attorney, daughter [name] to obtain consent for bedside percutaneous tracheostomy.  After discussing the indications due to the fact the patient is apneic and pressure support ventilation and would require permanent mechanical ventilatory support the patient's daughter expressed that she was under the assumption that the patient would no longer need the mechanical ventilator and would be able to breathe through the tracheostomy and replacement.  I explained that due to the patient's current status as well as discussion with the respiratory therapists that have been caring for the patient in [place]. the patient has remained apneic and has required permanent mechanical support during his entire stay.  At this time our telephone call was moved to a conference telephone call with the patient's sister who lives in [place] as well as the daughter.  We discussed on the phone the risk benefits and alternatives of proceeding with tracheostomy.  We discussed that the patient has multiple medical comorbidities to include cardiomyopathy as well as large MCA stroke currently with a comatose state requiring permanent mechanical ventilation.  That if they proceeded with tracheostomy that he would likely end up in a vent dependent facility for permanent care and that he would not survive without mechanical life support.  At this time the patient's family with this information knowing that he would require permanent vent support and not be able to breathe just through the tracheostomy do not believe that putting him through additional procedures only for him to remain connected to the ventilator would be appropriate.  Therefore they would like to speak with the patient's father who is 61 years old as well as other brothers and sisters to confirm this information.  They at  this point are considering a palliative withdrawal from the ventilator for a dignified passing.  We will not be up paining a bedside percutaneous tracheostomy today.  I did explain to them that I would be willing to place the tube if they make the decision they would like permanent mechanical support.  They are going to talk amongst themselves and call us back to let us know of their final decision.  [name], DO [facility] 07/23/2018 4:45 PM  Personal pager: [phone] If unanswered, please page CCM On-call: [phone]

## 2018-07-24 LAB — BASIC METABOLIC PANEL
Anion gap: 12 (ref 5–15)
BUN: 18 mg/dL (ref 8–23)
CO2: 25 mmol/L (ref 22–32)
Calcium: 9.1 mg/dL (ref 8.9–10.3)
Chloride: 99 mmol/L (ref 98–111)
Creatinine, Ser: 0.67 mg/dL (ref 0.61–1.24)
GFR calc Af Amer: >60 mL/min (ref >60)
GFR calc non Af Amer: >60 mL/min (ref >60)
Glucose, Bld: 110 mg/dL — ABNORMAL HIGH (ref 70–99)
Potassium: 4.5 mmol/L (ref 3.5–5.1)
Sodium: 136 mmol/L (ref 135–145)

## 2018-07-24 LAB — CBC
HCT: 35.3 % — ABNORMAL LOW (ref 39.0–52.0)
Hemoglobin: 11.5 g/dL — ABNORMAL LOW (ref 13.0–17.0)
MCH: 30.1 pg (ref 26.0–34.0)
MCHC: 32.6 g/dL (ref 30.0–36.0)
MCV: 92.4 fL (ref 80.0–100.0)
Platelets: 445 10*3/uL — ABNORMAL HIGH (ref 150–400)
RBC: 3.82 MIL/uL — ABNORMAL LOW (ref 4.22–5.81)
RDW: 14.8 % (ref 11.5–15.5)
WBC: 12.7 10*3/uL — ABNORMAL HIGH (ref 4.0–10.5)
nRBC: 0 % (ref 0.0–0.2)

## 2018-07-24 LAB — MAGNESIUM: Magnesium: 2.1 mg/dL (ref 1.7–2.4)

## 2018-07-24 LAB — PHOSPHORUS: Phosphorus: 4.8 mg/dL — ABNORMAL HIGH (ref 2.5–4.6)

## 2018-07-24 LAB — GLUCOSE, CAPILLARY
Glucose-Capillary: 96 mg/dL (ref 70–99)
Glucose-Capillary: 130 mg/dL — ABNORMAL HIGH (ref 70–99)
Glucose-Capillary: 122 mg/dL — ABNORMAL HIGH (ref 70–99)

## 2018-07-24 LAB — VALPROIC ACID LEVEL: Valproic Acid Lvl: 62 ug/mL (ref 50.0–100.0)

## 2018-07-24 MED ORDER — MORPHINE BOLUS VIA INFUSION
5.0000 mg | INTRAVENOUS | Status: DC | PRN
  Administered 2018-07-24: 15:00:00 5 mg via INTRAVENOUS
  Filled 2018-07-24: qty 5

## 2018-07-24 MED ORDER — GLYCOPYRROLATE 1 MG PO TABS
1.0000 mg | ORAL_TABLET | ORAL | Status: DC | PRN
  Filled 2018-07-24: qty 1

## 2018-07-24 MED ORDER — GLYCOPYRROLATE 0.2 MG/ML IJ SOLN
0.2000 mg | INTRAMUSCULAR | Status: DC | PRN
  Administered 2018-07-24: 0.2 mg via INTRAVENOUS
  Filled 2018-07-24: qty 1

## 2018-07-24 MED ORDER — DIPHENHYDRAMINE HCL 50 MG/ML IJ SOLN: 25.0000 mg | INTRAMUSCULAR | Status: DC | PRN

## 2018-07-24 MED ORDER — DEXTROSE 5 % IV SOLN: INTRAVENOUS | Status: DC

## 2018-07-24 MED ORDER — MORPHINE SULFATE (PF) 2 MG/ML IV SOLN: 2.0000 mg | INTRAVENOUS | Status: DC | PRN

## 2018-07-24 MED ORDER — ACETAMINOPHEN 650 MG RE SUPP: 650.0000 mg | Freq: Four times a day (QID) | RECTAL | Status: DC | PRN

## 2018-07-24 MED ORDER — GLYCOPYRROLATE 0.2 MG/ML IJ SOLN: 0.2000 mg | INTRAMUSCULAR | Status: DC | PRN

## 2018-07-24 MED ORDER — ACETAMINOPHEN 325 MG PO TABS: 650.0000 mg | ORAL_TABLET | Freq: Four times a day (QID) | ORAL | Status: DC | PRN

## 2018-07-24 MED ORDER — MORPHINE 100MG IN NS 100ML (1MG/ML) PREMIX INFUSION
0.0000 mg/h | INTRAVENOUS | Status: DC
  Administered 2018-07-24: 5 mg/h via INTRAVENOUS
  Filled 2018-07-24: qty 100

## 2018-07-24 MED ORDER — POLYVINYL ALCOHOL 1.4 % OP SOLN
1.0000 [drp] | Freq: Four times a day (QID) | OPHTHALMIC | Status: DC | PRN
  Filled 2018-07-24: qty 15

## 2018-07-27 ENCOUNTER — Telehealth: Payer: Self-pay

## 2018-07-27 NOTE — Telephone Encounter (Signed)
Received dc from [employer].  DC is for cremation and a patient of Doctor Vassie Loll.   DC will be taken to Pulmonary Unit  for signature.

## 2018-07-30 NOTE — Discharge Summary (Signed)
NAME:  Edward Cooper, MRN:  [id number], DOB:  Jul 24, 1955, LOS: 22 ADMISSION DATE:  07/05/2018, CONSULTATION DATE:  07/09/2018 REFERRING MD:  [facility], CHIEF COMPLAINT:  seizures  Brief History   [age] yoM presenting with witnessed seizures x 3, did not return to baseline and required intubation for airway protection.  [facility]    Past Medical History  CAD s/p PCI, LV thrombus on coumadin,  PVD, GERD, HTN, HLD, ICM, seizures, tobacco and ETOH abuse  Significant Hospital Events    Consults:  Neurology   Procedures:  5/4 ETT >> 5/13 Rt PICC >>   Significant Diagnostic Tests:  [facility] >> chronic changes, no acute abnormality 5/5 EEG >>abnormal, intermittent left hemispheric sharp waves.   LTM EEG 5/5 >> multiple seizures arising from left paracentral frontocentral and central parietal cortex. 5/7 left PLEDs 5/10 EEG Left frontocentral central parietal poorly formed sharply still present suggestive of some degree of cortical irritability in that region.   5/11 EEG  Occasionally left frontocentral sharp waves and spikes present suggestive of some degree of cortical irritability. No clinical or subclinical seizures present throughout the recording.  5/12 MRI >> Extensive RIGHT hemisphere infarct, RIGHT MCA territory, both acute and subacute, with areas of reperfusion hemorrhage affecting not only cortex but the basal ganglia.Some extra-axial hemorrhage on the RIGHT  Micro Data:  5/4 MRSA PCR  >>neg 5/5 respiratory >> neg 5/14 C diff Ag POS, PCR positive 5/16:Sputum: H influenza as well as E. coli both sensitive to ceftriaxone  Antimicrobials:  5/5 unasyn > 5/11 5/14 vancomycin oral >> 5/16: Vancomycin>>> 5/18 5/16: Cefepime>>> 5/18 Ceftriaxone 5/18 >> 5/25  Resolved Hospital Problem list   AKI  Aspiration PNA w/ RLL infiltrate (completed 7 d rx) Hypernatremia Status epilepticus Assessment & Plan:   Large right MCA infarct/reperfusion  hemorrhage, complicated by acute metabolic encephalopathy?  Alcohol withdrawal contributing, versus acute infection It is felt the acute stroke likely was the cause of status - he will be significantly debilitated as a consequence of his stroke Plan Continuing Trileptal, Depakote, Keppra and Vimpat as per  neurology    Acute respiratory failure w/ ventilator dependence d/t on-going encephalopathy, complicated by ventilator associated pneumonia (E-coli and H influenza both (S) to CTX)   Coronary artery disease status post PCI, left ventricular thrombus on Coumadin, PVD Ejection fraction 35 to 40% in 2014 Plan Brilinta was on hold for trach/ peg   telemetry    HCAP -E. coli and Haemophilus C. difficile colitis Plan DC ceftriaxone 5/25 continue oral vancomycin    Hyperglycemia Plan Sliding scale insulin    COURSE  - 5/4 Admit 5/5 Sedated on propofol , loaded with Vimpat, Keppra and valproate And Ativan standing dose every 4 hours 5/6 Developed hematuria, Foley blocked with clot and removed and heparin was stopped 5/7 versed gtt due to persistent status 5/11 Remains on vent with abnormal EEG.  5/12 weaning versed and up-titrating propofol in hopes of burst suppression.  5/16: No further seizures.  Remains comatose however tolerating pressure support ventilation, starting antibiotics (cefepime and vancomycin) for temperature and left lower lobe pneumonia 5/17 looks about the same  5/18 eyes open, does not track, antibiotics narrowed to ceftriaxone 5/19: New twitching of left leg, EEG ordered 5/20: EEG negative for seizure.  White blood cell count improving.  No fever spikes. 5/21 Brilinta stopped; Trach planned for 5/25 now  5/25 tracheostomy canceled since daughter and family opting for comfort care  5/26 -  unfortunately he will have significant debility from the stroke, after understanding this family  opted for comfort care.  Life support was withdrawn 5/26 and he passed  away soon after   Cause of death-acute ischemic right MCA CVA, status epilepticus, acute respiratory failure, ischemic cardiomyopathy     Cyril Mourning MD. FCCP. [facility] Pager [phone] If no response call [phone]    07/25/18 10:21 AM

## 2018-07-30 NOTE — Telephone Encounter (Signed)
Received original signed D/C-[facility] notified for pick along with sending a faxed copy.

## 2018-07-30 NOTE — Progress Notes (Signed)
PT Cancellation Note  Patient Details Name: Edward Cooper MRN: [id number] DOB: 01/15/1956   Cancelled Treatment:    Reason Eval/Treat Not Completed: PT screened, no needs identified, will sign off Pt to be terminally extubated today so PT signing off at this time. Please re-consult if anything changes. Thanks   Marcy Panning 07/19/2018, 8:37 AM Mylo Red, PT, DPT Acute Rehabilitation Services Pager [phone] Office [phone]

## 2018-07-30 NOTE — Procedures (Signed)
Extubation Procedure Note  Patient Details:   Name: JEREMEE DOMEN DOB: 01/24/1956 MRN: [id number]   Airway Documentation:    Vent end date: 07/01/2018 Vent end time: 1501   Evaluation  O2 sats: stable throughout Complications: Complications of no clear cough Patient did tolerate procedure well. Bilateral Breath Sounds: Rhonchi, Diminished   No Terminal extubation Peggye Form 07/16/2018, 3:01 PM

## 2018-07-30 NOTE — Progress Notes (Signed)
50mg  Morphine wasted with RN Youlanda Roys during post mortem care.

## 2018-07-30 NOTE — Progress Notes (Signed)
NAME:  Edward Cooper, MRN:  [id number], DOB:  [birth year], LOS: 22 ADMISSION DATE:  07/15/2018, CONSULTATION DATE:  07/18/2018 REFERRING MD:  [facility], CHIEF COMPLAINT:  seizures  Brief History   21 yoM presenting with witnessed seizures x 3, did not return to baseline and required intubation for airway protection.  Transferred from [facility] to [facility] for continuous EEG.    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  5/12 weaning versed and up-titrating propofol in hopes of burst suppression.  5/16: No further seizures.  Remains comatose however tolerating pressure support ventilation, starting antibiotics (cefepime and vancomycin) for temperature and left lower lobe pneumonia 5/17 looks about the same  5/18 eyes open, does not track, antibiotics narrowed to ceftriaxone 5/19: New twitching of left leg, EEG ordered 5/20: EEG negative for seizure.  White blood cell count improving.  No fever spikes. 5/21 Brilinta stopped; Trach planned for 5/25 now  5/25 tracheostomy canceled since daughter and family opting for comfort care Consults:  Neurology   Procedures:  5/4 ETT >> 5/13 Rt PICC >>   Significant Diagnostic Tests:  5/4 [facility] >> chronic changes, no acute abnormality 5/5 EEG >>abnormal, intermittent left hemispheric sharp waves.   LTM EEG 5/5 >> multiple seizures arising from left paracentral frontocentral and central parietal cortex. 5/7 left PLEDs 5/10 EEG Left frontocentral central parietal poorly formed sharply still present suggestive of some degree of cortical irritability in that region.   5/11 EEG   Occasionally left frontocentral sharp waves and spikes present suggestive of some degree of cortical irritability. No clinical or subclinical seizures present throughout the recording.  5/12 MRI >> Extensive RIGHT hemisphere infarct, RIGHT MCA territory, both acute and subacute, with areas of reperfusion hemorrhage affecting not only cortex but the basal ganglia.Some extra-axial hemorrhage on the RIGHT  Micro Data:  5/4 MRSA PCR  >>neg 5/5 respiratory >> neg 5/14 C diff Ag POS, PCR positive 5/16:Sputum: H influenza as well as E. coli both sensitive to ceftriaxone  Antimicrobials:  5/5 unasyn > 5/11 5/14 vancomycin oral >> 5/16: Vancomycin>>> 5/18 5/16: Cefepime>>> 5/18 Ceftriaxone 5/18 >> 5/25 Interim history/subjective:   Severe sedation this morning for agitation Episode of mucous plugging this morning with high peak pressures on vent and desat and thick mucous plugs suctioned out Low-grade febrile  Objective   Blood pressure (!) 140/91, pulse (!) 110, temperature 100.1 F (37.8 C), temperature source Axillary, resp. rate 16, height 5\' 11"  (1.803 m), weight 74.1 kg, SpO2 100 %.    Vent Mode: PRVC FiO2 (%):  [40 %] 40 % Set Rate:  [16 bmp] 16 bmp Vt Set:  [600 mL] 600 mL PEEP:  [5 cmH20] 5 cmH20 Plateau Pressure:  [14 cmH20-19 cmH20] 19 cmH20   Intake/Output Summary (Last 24 hours) at 08-22-18 0843 Last data filed at 08-22-2018 0800 Gross per 24 hour  Intake 1024.89 ml  Output 1715 ml  Net -690.11 ml   Filed Weights   07/22/18 0500 07/23/18 0500 August 22, 2018 0451  Weight: 74.4 kg 74 kg 74.1 kg    Examination:   General: Chronically ill-appearing, no distress HEENT  /AT;  no JVD; sclerae anicteric; mild pallor Pulmonary: CTAB; diminished in the bases, no accessory muscle use,bilateral scattered rhonchi Cardiac: RRR no r/m/g Abdomen: Soft, not tender.  Continues to have liquid stool he has a Flexi-Seal in place Neuro: Unresponsive this morning some weak withdrawal on  the right only. LLE ankle clonus. Hemiparetic on the left. GU: Clear yellow.  Chest x-ray 5/21 personally reviewed which shows basilar effusions with mild bibasilar atelectasis  Resolved Hospital Problem list   AKI  Aspiration PNA w/ RLL infiltrate (completed 7 d rx) Hypernatremia Status epilepticus Assessment & Plan:   Large right MCA infarct/reperfusion hemorrhage, complicated by acute metabolic encephalopathy?  Alcohol withdrawal contributing, versus acute infection It is felt the acute stroke likely was the cause of status - he will be significantly debilitated as a consequence of his stroke Plan Continuing Trileptal, Depakote, Keppra and Vimpat as per  neurology    Acute respiratory failure w/ ventilator dependence d/t on-going encephalopathy, complicated by ventilator associated pneumonia (E-coli and H influenza both (S) to CTX)  Plan Does not tolerate pressure support today Yesterday was canceled and family now opting for comfort care    Hypertension and hyperlipidemia Plan Monitoring blood pressure, continuing statin   Coronary artery disease status post PCI, left ventricular thrombus on Coumadin, PVD Ejection fraction 35 to 40% in 2014 Plan Brilinta was on hold for trach/ peg   telemetry   Fever, leukocytosis: Improving Plan Repeat CBC in a.m.  HCAP -E. coli and Haemophilus C. difficile colitis Plan DC ceftriaxone 5/25 continue oral vancomycin    Hyperglycemia Plan Sliding scale insulin   Best practice:  Diet: Tube feeds Pain/Anxiety/Delirium protocol (if indicated): Sedation as needed VAP protocol (if indicated): yes DVT prophylaxis: SCDs GI prophylaxis: EPI Glucose control: SSI, sensitive scale Mobility: BR Code Status: Full  Family Communication: Daughter Disposition:  ICU   Summary-unfortunately he will have significant debility from the stroke, after understanding this family has opted for comfort care.  Will allow them to visit and  then withdraw life support  The patient is critically ill with multiple organ systems failure and requires high complexity decision making for assessment and support, frequent evaluation and titration of therapies, application of advanced monitoring technologies and extensive interpretation of multiple databases. Critical Care Time devoted to patient care services described in this note independent of APP/resident  time is 32 minutes.     Cyril Mourningakesh Alva MD. Tonny BollmanFCCP. [place] Pager [phone] If no response call [phone]    Feb 04, 2019 8:43 AM

## 2018-07-30 NOTE — Progress Notes (Signed)
Nutrition Brief Note  Chart reviewed. Pt now transitioning to comfort care.  No further nutrition interventions warranted at this time.  Please re-consult as needed.   Yurani Fettes RD, LDN, CNSC [phone] Pager [phone] After Hours Pager    

## 2018-07-30 NOTE — Progress Notes (Signed)
Occupational Therapy Discharge Patient Details Name: Edward Cooper MRN: [id number] DOB: 01-09-1956 Today's Date: 08/14/18 Time:  -     Patient discharged from OT services secondary to medical decline - will need to re-order OT to resume therapy services. Noted to be one way extubation and comfort care  Please see latest therapy progress note for current level of functioning and progress toward goals.    Progress and discharge plan discussed with patient and/or caregiver: Patient/Caregiver agrees with plan  GO     Fontaine No, OTR/L  Acute Rehabilitation Services Pager: [phone] Office: [phone] .  2018/08/14, 7:35 AM

## 2018-07-30 DEATH — deceased

## 2020-09-03 IMAGING — CT CT HEAD WITHOUT CONTRAST
3 series · 15 of 47 positions shown, 18 images · non-contrast
Comparison: 11/22/2007

CLINICAL DATA: Recent seizure activity

EXAM:
CT HEAD WITHOUT CONTRAST
TECHNIQUE: Contiguous axial images were obtained from the base of the skull
through the vertex without intravenous contrast.

[Series 2: head wo · axial · 0.47mm/px · z∈[-128,-3]mm · 9 of 31 slices shown, 12 images]
[im 3/31  brain]
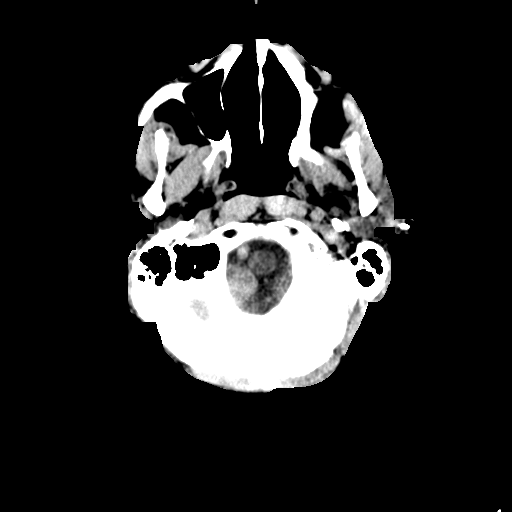
[im 3/31  bone]
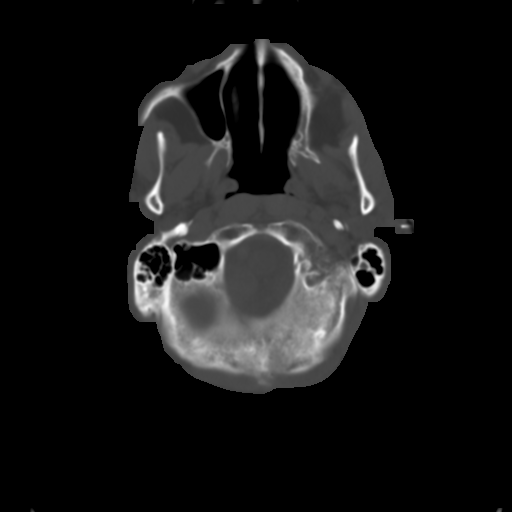
[im 6/31  brain]
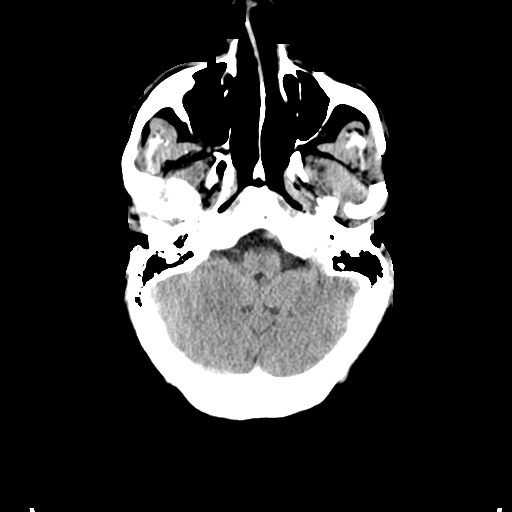
[im 9/31  brain]
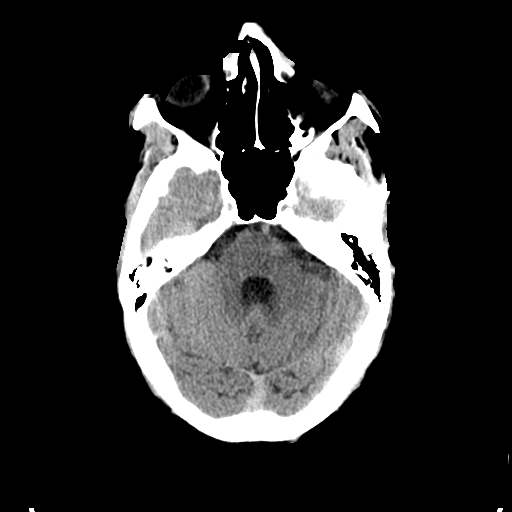
[im 12/31  brain]
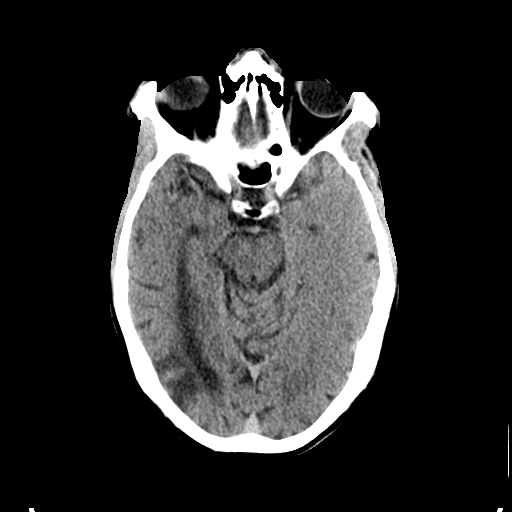
[im 16/31  brain]
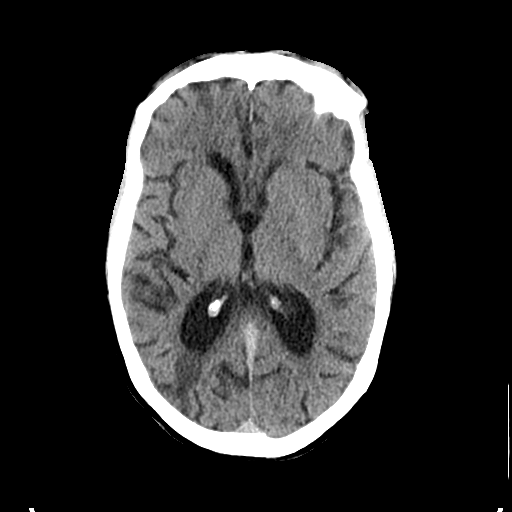
[im 16/31  bone]
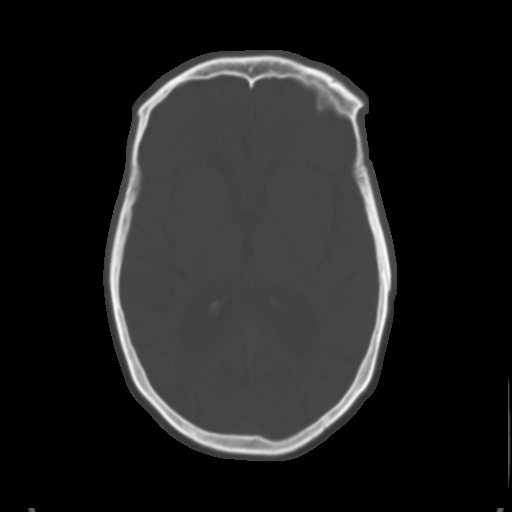
[im 19/31  brain]
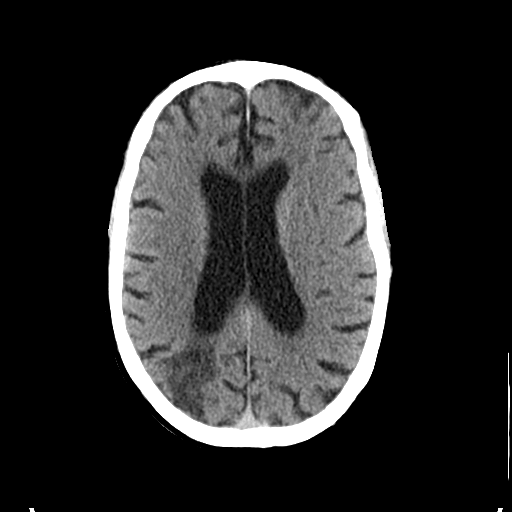
[im 22/31  brain]
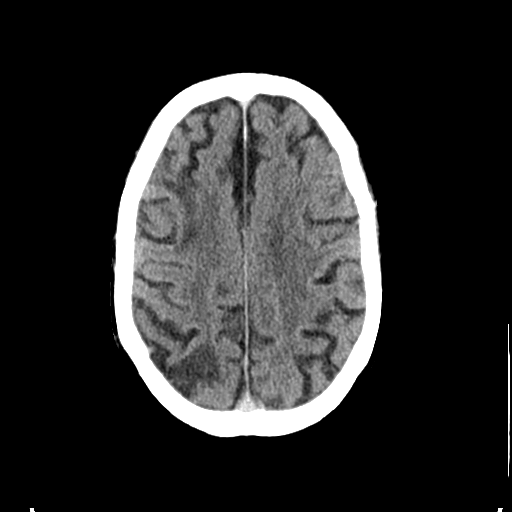
[im 25/31  brain]
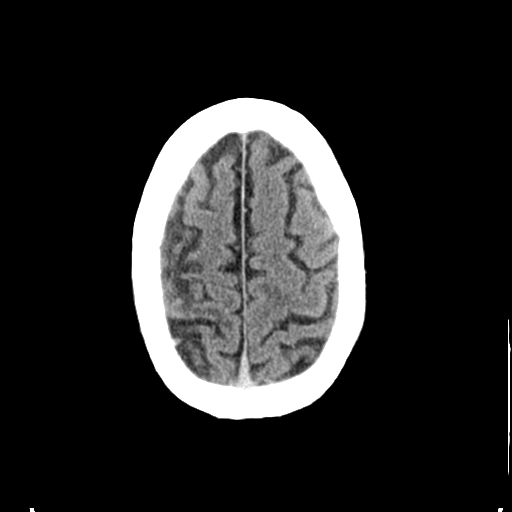
[im 28/31  brain]
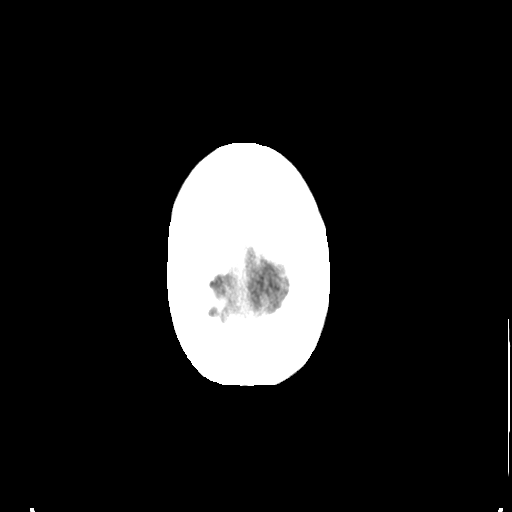
[im 28/31  bone]
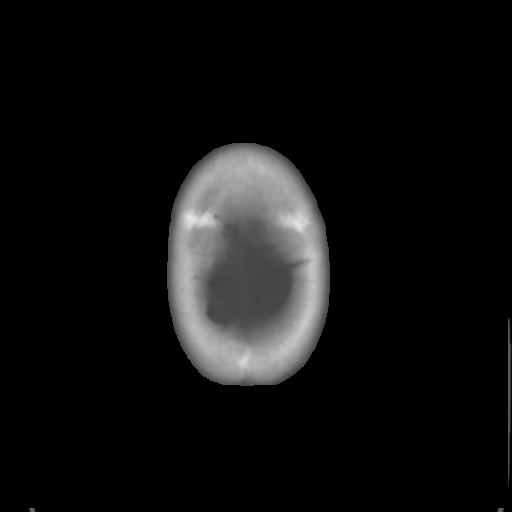

[Series 4: coronal soft tissue · coronal · 0.31mm/px · 3 of 65 slices shown]
[im 22/65  brain]
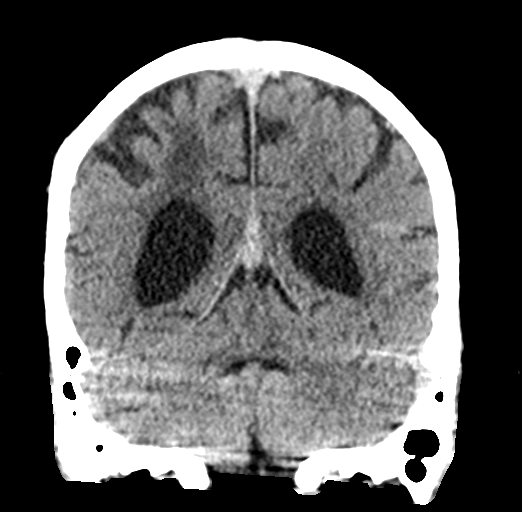
[im 29/65  brain]
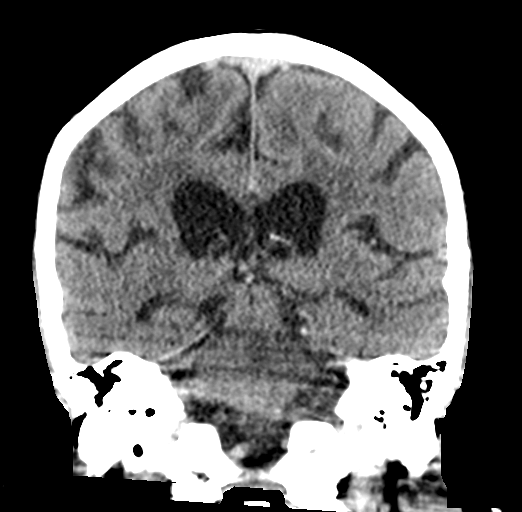
[im 36/65  brain]
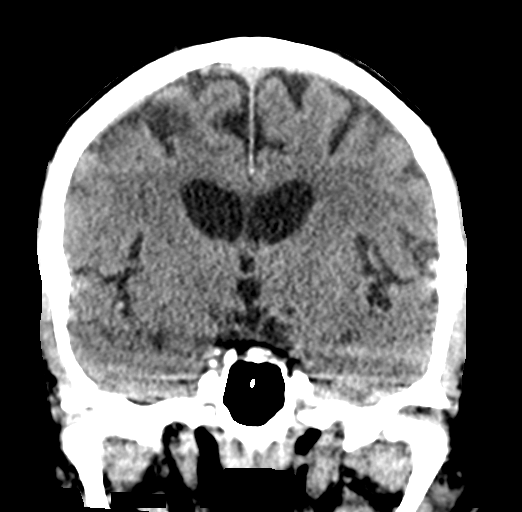

[Series 5: sagittal soft tissue · sagittal · 0.30mm/px · 3 of 47 slices shown]
[im 16/47  brain]
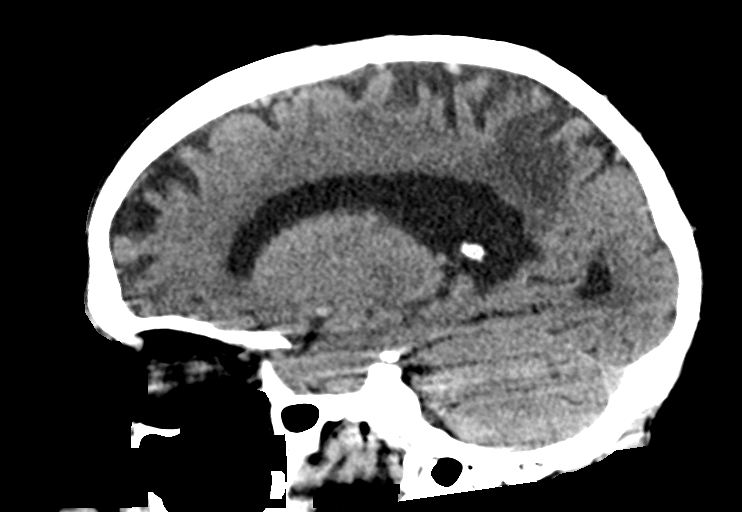
[im 24/47  brain]
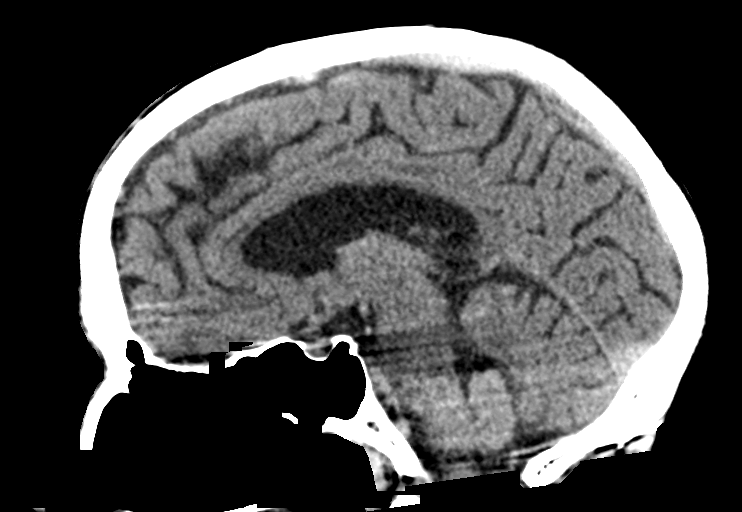
[im 31/47  brain]
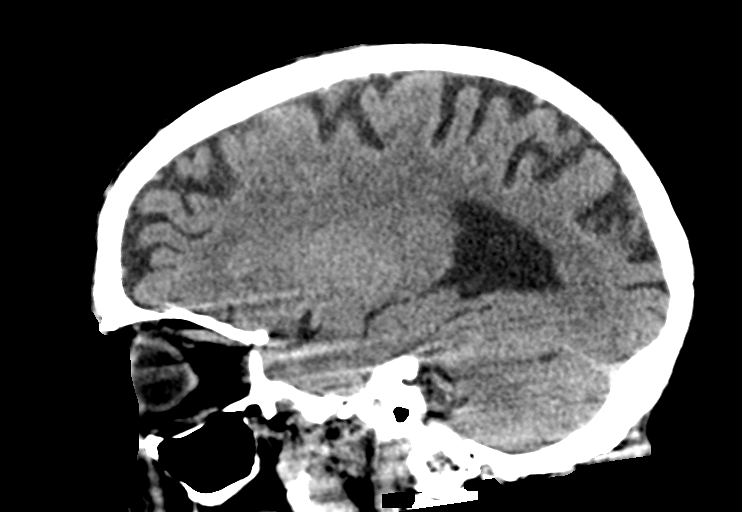

[15 of 47 positions shown; findings below may reference images not displayed]

FINDINGS: Brain: Mild atrophic changes are noted. Mild areas of decreased
attenuation are noted consistent with encephalomalacia from prior
infarcts in the right posterior parietooccipital region as well as
within the right parietal lobe near the vertex. No findings to
suggest acute hemorrhage, acute infarction or space-occupying mass
lesion are noted.

Vascular: No hyperdense vessel or unexpected calcification.

Skull: Normal. Negative for fracture or focal lesion.

Sinuses/Orbits: No acute finding.

Other: None.
IMPRESSION: Chronic changes without acute abnormality.

## 2020-09-03 IMAGING — DX PORTABLE CHEST - 1 VIEW
1 series · 1 of 1 positions shown · non-contrast
Comparison: Portable exam 7072 hours compared to 08/07/2012

CLINICAL DATA: Intubation, seizure

EXAM:
PORTABLE CHEST 1 VIEW

[chest ap]
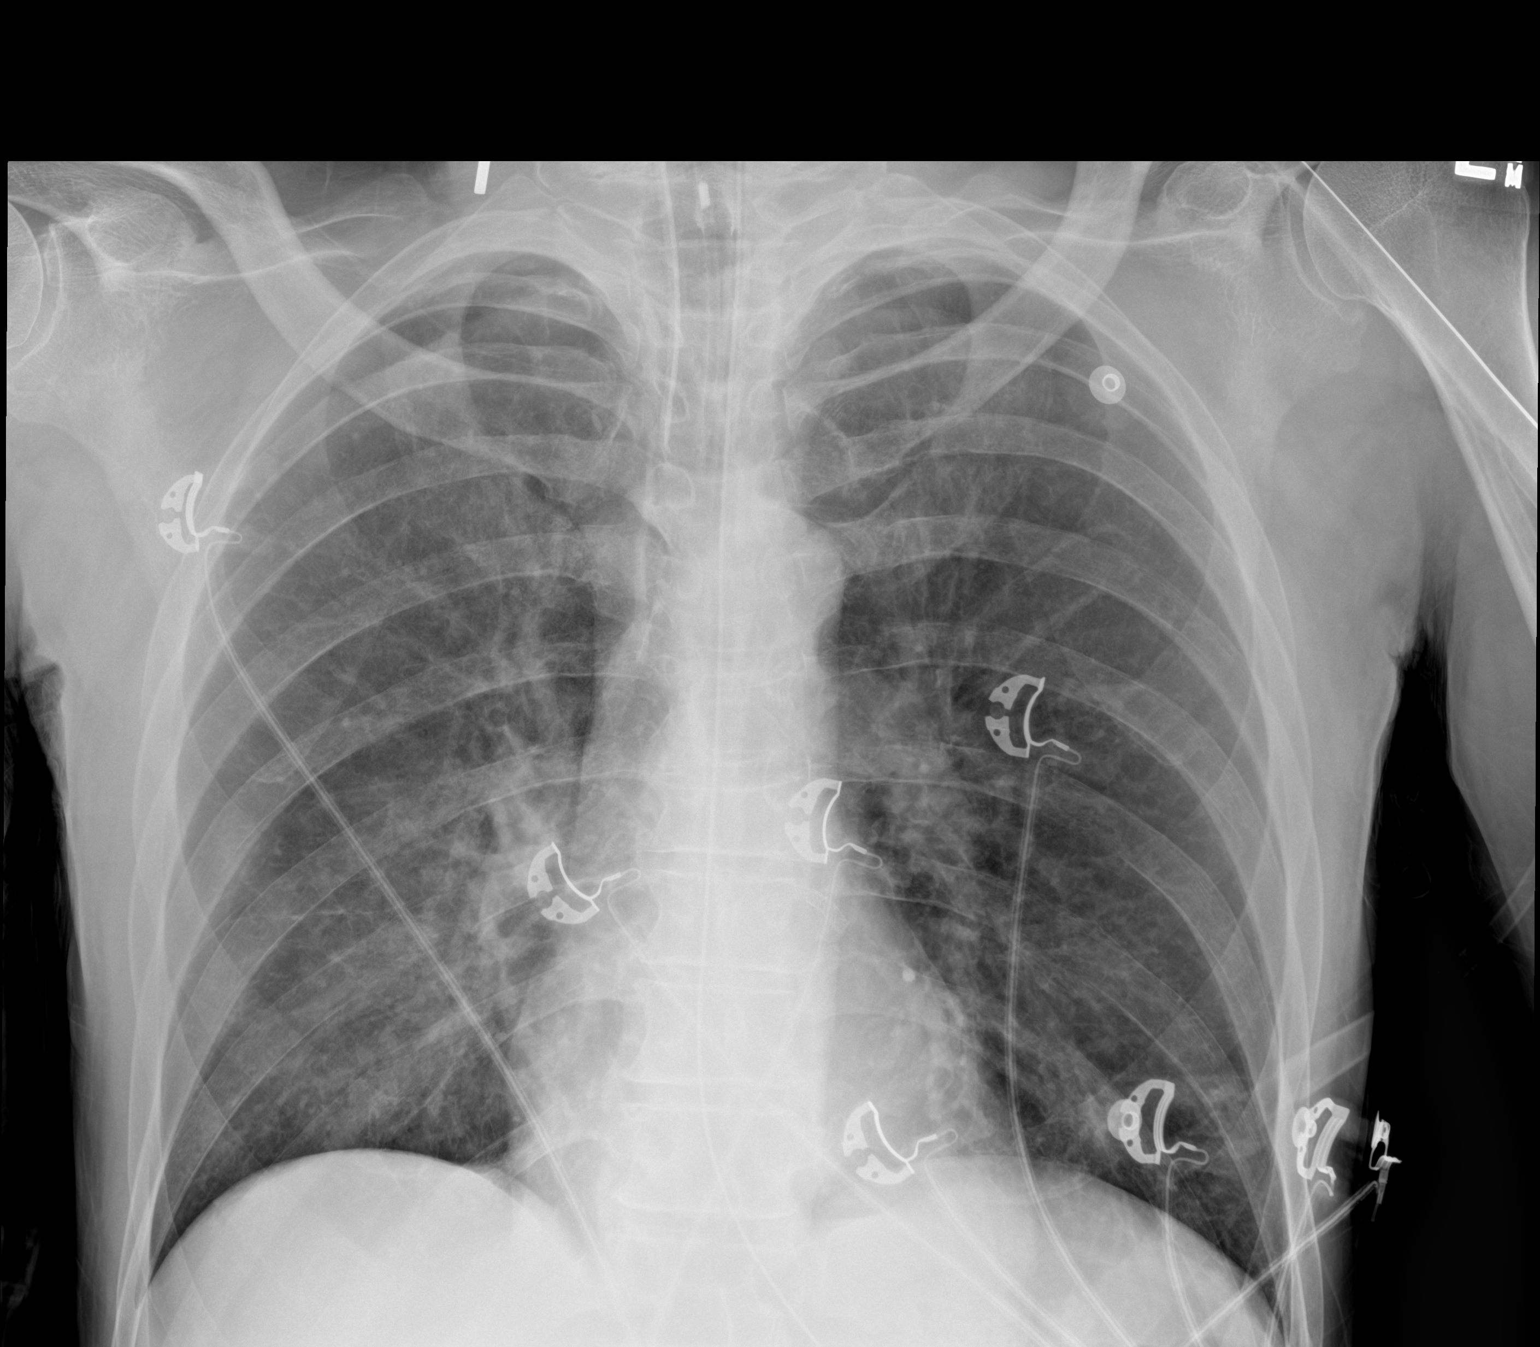

[1 of 1 positions shown; findings below may reference images not displayed]

FINDINGS: Endotracheal tube present with tip projecting 6.0 cm above carina.

Nasogastric tube extends into stomach.

Normal heart size, mediastinal contours, and pulmonary vascularity.

Mild airspace infiltrate versus aspiration at RIGHT base.

Remaining lungs clear.

No pleural effusion or pneumothorax.
IMPRESSION: Tube positions as above.

Airspace infiltrates at RIGHT base question infiltrate versus
aspiration.
